# Patient Record
Sex: Female | Born: 1940 | Race: White | Hispanic: No | Smoking: Never smoker
Health system: Southern US, Community
[De-identification: ages and names within clinical notes are randomized; demographics above are authoritative.]

## PROBLEM LIST (undated history)

## (undated) DIAGNOSIS — I639 Cerebral infarction, unspecified: Secondary | ICD-10-CM

## (undated) DIAGNOSIS — H269 Unspecified cataract: Secondary | ICD-10-CM

## (undated) DIAGNOSIS — E079 Disorder of thyroid, unspecified: Secondary | ICD-10-CM

## (undated) DIAGNOSIS — I10 Essential (primary) hypertension: Secondary | ICD-10-CM

## (undated) DIAGNOSIS — D219 Benign neoplasm of connective and other soft tissue, unspecified: Secondary | ICD-10-CM

## (undated) HISTORY — DX: Benign neoplasm of connective and other soft tissue, unspecified: D21.9

## (undated) HISTORY — PX: EYE SURGERY: SHX253

## (undated) HISTORY — PX: OTHER SURGICAL HISTORY: SHX169

## (undated) HISTORY — DX: Unspecified cataract: H26.9

## (undated) HISTORY — DX: Cerebral infarction, unspecified: I63.9

## (undated) HISTORY — DX: Essential (primary) hypertension: I10

## (undated) HISTORY — DX: Disorder of thyroid, unspecified: E07.9

---

## 2004-02-08 ENCOUNTER — Other Ambulatory Visit: Admission: RE | Admit: 2004-02-08 | Discharge: 2004-02-08 | Payer: Self-pay | Admitting: Obstetrics and Gynecology

## 2004-03-17 ENCOUNTER — Ambulatory Visit: Payer: Self-pay | Admitting: Internal Medicine

## 2004-03-22 ENCOUNTER — Encounter: Admission: RE | Admit: 2004-03-22 | Discharge: 2004-03-22 | Payer: Self-pay | Admitting: Internal Medicine

## 2004-04-15 ENCOUNTER — Ambulatory Visit: Payer: Self-pay | Admitting: Internal Medicine

## 2004-04-17 DIAGNOSIS — D219 Benign neoplasm of connective and other soft tissue, unspecified: Secondary | ICD-10-CM

## 2004-04-17 HISTORY — DX: Benign neoplasm of connective and other soft tissue, unspecified: D21.9

## 2004-04-19 ENCOUNTER — Ambulatory Visit: Payer: Self-pay | Admitting: Internal Medicine

## 2004-04-21 ENCOUNTER — Ambulatory Visit: Payer: Self-pay | Admitting: Internal Medicine

## 2004-05-30 ENCOUNTER — Ambulatory Visit: Payer: Self-pay | Admitting: Internal Medicine

## 2004-10-17 ENCOUNTER — Encounter: Admission: RE | Admit: 2004-10-17 | Discharge: 2005-01-15 | Payer: Self-pay | Admitting: Neurology

## 2007-09-24 ENCOUNTER — Encounter: Admission: RE | Admit: 2007-09-24 | Discharge: 2007-11-04 | Payer: Self-pay | Admitting: Obstetrics and Gynecology

## 2008-01-03 ENCOUNTER — Encounter
Admission: RE | Admit: 2008-01-03 | Discharge: 2008-01-03 | Payer: Self-pay | Admitting: Physical Medicine and Rehabilitation

## 2008-02-04 ENCOUNTER — Encounter
Admission: RE | Admit: 2008-02-04 | Discharge: 2008-03-31 | Payer: Self-pay | Admitting: Physical Medicine and Rehabilitation

## 2008-12-09 ENCOUNTER — Inpatient Hospital Stay (HOSPITAL_COMMUNITY): Admission: EM | Admit: 2008-12-09 | Discharge: 2008-12-12 | Payer: Self-pay | Admitting: Emergency Medicine

## 2008-12-10 ENCOUNTER — Encounter (INDEPENDENT_AMBULATORY_CARE_PROVIDER_SITE_OTHER): Payer: Self-pay | Admitting: Internal Medicine

## 2008-12-11 ENCOUNTER — Encounter: Payer: Self-pay | Admitting: Neurology

## 2010-03-04 ENCOUNTER — Ambulatory Visit (HOSPITAL_COMMUNITY)
Admission: RE | Admit: 2010-03-04 | Discharge: 2010-03-04 | Payer: Self-pay | Source: Home / Self Care | Admitting: Interventional Radiology

## 2010-06-28 LAB — BUN: BUN: 25 mg/dL — ABNORMAL HIGH (ref 6–23)

## 2010-06-28 LAB — CREATININE, SERUM
Creatinine, Ser: 0.79 mg/dL (ref 0.4–1.2)
GFR calc Af Amer: >60 mL/min (ref >60)
GFR calc non Af Amer: >60 mL/min (ref >60)

## 2010-07-23 LAB — CARDIAC PANEL(CRET KIN+CKTOT+MB+TROPI)
CK, MB: 1.2 ng/mL (ref 0.3–4.0)
CK, MB: 1.5 ng/mL (ref 0.3–4.0)
Relative Index: INVALID (ref 0.0–2.5)
Relative Index: INVALID (ref 0.0–2.5)
Total CK: 33 U/L (ref 7–177)
Troponin I: 0.01 ng/mL (ref 0.00–0.06)
Troponin I: 0.01 ng/mL (ref 0.00–0.06)
Troponin I: 0.01 ng/mL (ref 0.00–0.06)

## 2010-07-23 LAB — POCT CARDIAC MARKERS
CKMB, poc: 1.3 ng/mL (ref 1.0–8.0)
Myoglobin, poc: 78.5 ng/mL (ref 12–200)
Troponin i, poc: <0.05 ng/mL (ref 0.00–0.09)

## 2010-07-23 LAB — COMPREHENSIVE METABOLIC PANEL
ALT: 20 U/L (ref 0–35)
AST: 23 U/L (ref 0–37)
Albumin: 3.2 g/dL — ABNORMAL LOW (ref 3.5–5.2)
Alkaline Phosphatase: 82 U/L (ref 39–117)
BUN: 23 mg/dL (ref 6–23)
CO2: 25 mEq/L (ref 19–32)
Calcium: 8.7 mg/dL (ref 8.4–10.5)
Chloride: 105 mEq/L (ref 96–112)
Creatinine, Ser: 0.91 mg/dL (ref 0.4–1.2)
GFR calc Af Amer: >60 mL/min (ref >60)
GFR calc non Af Amer: >60 mL/min (ref >60)
Glucose, Bld: 123 mg/dL — ABNORMAL HIGH (ref 70–99)
Potassium: 3.7 mEq/L (ref 3.5–5.1)
Sodium: 136 mEq/L (ref 135–145)
Total Bilirubin: 0.4 mg/dL (ref 0.3–1.2)
Total Protein: 6.5 g/dL (ref 6.0–8.3)

## 2010-07-23 LAB — POCT I-STAT, CHEM 8
BUN: 25 mg/dL — ABNORMAL HIGH (ref 6–23)
Calcium, Ion: 1.19 mmol/L (ref 1.12–1.32)
Chloride: 103 mEq/L (ref 96–112)
Creatinine, Ser: 0.8 mg/dL (ref 0.4–1.2)
Glucose, Bld: 87 mg/dL (ref 70–99)
HCT: 43 % (ref 36.0–46.0)
Hemoglobin: 14.6 g/dL (ref 12.0–15.0)
Potassium: 3.9 mEq/L (ref 3.5–5.1)
Sodium: 138 mEq/L (ref 135–145)
TCO2: 29 mmol/L (ref 0–100)

## 2010-07-23 LAB — CBC
HCT: 41 % (ref 36.0–46.0)
Hemoglobin: 13.7 g/dL (ref 12.0–15.0)
MCHC: 33.6 g/dL (ref 30.0–36.0)
MCV: 88.1 fL (ref 78.0–100.0)
Platelets: 284 10*3/uL (ref 150–400)
RBC: 4.65 MIL/uL (ref 3.87–5.11)
RDW: 14 % (ref 11.5–15.5)
WBC: 7.3 10*3/uL (ref 4.0–10.5)

## 2010-07-23 LAB — DIFFERENTIAL
Basophils Absolute: 0 10*3/uL (ref 0.0–0.1)
Basophils Relative: 0 % (ref 0–1)
Eosinophils Absolute: 0.1 10*3/uL (ref 0.0–0.7)
Eosinophils Relative: 1 % (ref 0–5)
Lymphocytes Relative: 18 % (ref 12–46)
Lymphs Abs: 1.3 10*3/uL (ref 0.7–4.0)
Monocytes Absolute: 0.8 10*3/uL (ref 0.1–1.0)
Monocytes Relative: 11 % (ref 3–12)
Neutro Abs: 5.1 10*3/uL (ref 1.7–7.7)
Neutrophils Relative %: 70 % (ref 43–77)

## 2010-07-23 LAB — MAGNESIUM: Magnesium: 2.2 mg/dL (ref 1.5–2.5)

## 2010-07-23 LAB — LIPID PANEL
Cholesterol: 265 mg/dL — ABNORMAL HIGH (ref 0–200)
HDL: 39 mg/dL — ABNORMAL LOW (ref >39)
LDL Cholesterol: 190 mg/dL — ABNORMAL HIGH (ref 0–99)
Total CHOL/HDL Ratio: 6.8 RATIO
Triglycerides: 179 mg/dL — ABNORMAL HIGH (ref <150)
VLDL: 36 mg/dL (ref 0–40)

## 2010-07-23 LAB — PHOSPHORUS: Phosphorus: 4.2 mg/dL (ref 2.3–4.6)

## 2010-07-23 LAB — HEMOGLOBIN A1C
Hgb A1c MFr Bld: 6.3 % — ABNORMAL HIGH (ref 4.6–6.1)
Mean Plasma Glucose: 134 mg/dL

## 2010-07-23 LAB — APTT: aPTT: 33 seconds (ref 24–37)

## 2010-07-23 LAB — D-DIMER, QUANTITATIVE: D-Dimer, Quant: 0.93 ug/mL-FEU — ABNORMAL HIGH (ref 0.00–0.48)

## 2010-07-23 LAB — PROTIME-INR
INR: 0.9 (ref 0.00–1.49)
Prothrombin Time: 12.1 seconds (ref 11.6–15.2)

## 2010-07-23 LAB — TSH: TSH: 0.029 u[IU]/mL — ABNORMAL LOW (ref 0.350–4.500)

## 2010-08-30 NOTE — Consult Note (Signed)
NAMETELICIA, HODGKISS              ACCOUNT NO.:  [id number]   MEDICAL RECORD NO.:  [id number]          PATIENT TYPE:  INP   LOCATION:  0104                         FACILITY:  [facility]   PHYSICIAN:  Pramod P. Pearlean Brownie, MD    DATE OF BIRTH:  07-06-1940   DATE OF CONSULTATION:  12/09/2008  DATE OF DISCHARGE:                                 CONSULTATION   REFERRING PHYSICIAN:  [facility].   REASON FOR REFERRAL:  TIA.   HISTORY OF PRESENT ILLNESS:  Ms. Mand is a 70 year old Caucasian lady  who has been having intermittent symptoms of left leg incoordination and  weakness since 2 days ago.  The initial episode occurred in the middle  of the night on Monday night and when she got up to go the restroom she  noticed her left leg and she had trouble controlling it.  She went back  to sleep and when she woke up in the morning she was fine.  Yesterday  afternoon again she had another episode with the left leg control was a  problem.  She could walk but had trouble moving her left leg.  Today  again she had an episode which lasted an hour in which she had trouble  controlling her left leg.  She also noted she had trouble touching her  nose with her left hand but had no trouble with her right hand.  She  denied any headache, slurred speech, numbness, vertigo.  She has no  known prior history of TIA, stroke, seizures.  She was seen by Dr. Dennie Fetters  in our office a few years ago for right leg radicular pain and had an  MRI scan at that time bit she was not found to be a surgical candidate.  In the last couple of months  she has been complaining of some  intermittent paresthesias in the right hand but has not yet had an  evaluation done for that.   PAST MEDICAL HISTORY:  Significant for:  1. Arthritis.  2. Hyperlipidemia.  3. Scoliosis.  4. Hypothyroidism.   PAST SURGICAL HISTORY:  1. Thyroid surgery.  2. Facial growth removal in 1951.  3. 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  4. Partial thyroid  cyst removal 1960.   FAMILY HISTORY:  Positive for aneurysms in several uncles and dementia  in mother.  Mother died of a brain tumor.   HOME MEDICATIONS:  Amitriptyline and Armour Thyroid.   SOCIAL HISTORY:  She lives alone, does not smoke or drink.   ALLERGIES TO MEDICATIONS:  Aciphex, Aleve, codeine, erythromycin, Keflex  and Novocain.   REVIEW OF SYSTEMS:  Positive for numbness, gait difficulties, tingling.  No chest pain, fever, cough, shortness of breath or diarrhea.   PHYSICAL EXAM:  Reveals a pleasant middle-aged Caucasian lady currently  not in distress.  Temperature 97.6, blood pressure 179/92, pulse rate 84  per minute, regular,  respiratory rate 18 per minute, blood pressure  152/83.  Distal pulses are well felt.  HEAD:  Nontraumatic.  NECK:  Supple.  No bruit.  Surgical scar from thyroid surgery is noted  in the  neck.  CARDIAC EXAM:  Regular heart sounds.  No murmur or gallop.  LUNGS:  Clear to auscultation.  ABDOMEN:  Soft, nontender.  NEUROLOGICAL EXAM:  She is pleasant, awake, alert, cooperative.  There  is no aphasia, apraxia or dysarthria.  Pupils equal, reactive.  Eye  movements are full range.  Visual acuity and fields adequate.  Face is  symmetric.  Tongue is midline.  Motor system exam reveals no upper  extremity drift, symmetric upper and lower extremity strength, tone,  reflexes, coordination, sensation.  Gait was not tested.   DATA REVIEWED:  CT scan of the head shows no acute abnormality.  A  calcified dilated ectatic left vertebral artery is noted which is  causing some mass effect on the brain stem.  MRI scan of the brain is  not available but MRA of the brain is available which shows ectatic left  vertebral artery which is patent without significant flow restriction.  There is no large vessel stenosis noted.  Admission labs are pending at  this time.   IMPRESSION:  A 70-year -old lady with recurrent episodes of left leg  clumsiness with some left  hand clumsiness likely brainstem transient  ischemic attacks, etiology to be determined.  She does have a calcified  ectatic left vertebral artery which is causing some mass effect on the  brain stem, but I am not sure this is the etiology of her transient  ischemic attacks.  1. The patient is being admitted for further evaluation.  Recommend      completing MRI scan of the brain and carotid Doppler studies, two-      dimensional echocardiogram, fasting lipid profile, hemoglobin A1c.      __________ aspirin for now.  2. Treatment for her lipids with statins.  3. Physical, occupational therapy consults.  We will be happy to      follow the patient in consult.  Kindly call for questions.           ______________________________  Sunny Schlein. Pearlean Brownie, MD     PPS/MEDQ  D:  12/09/2008  T:  12/09/2008  Job:  [id number]

## 2010-08-30 NOTE — Discharge Summary (Signed)
Patricia Beck, ROE NO.:  [id number]   MEDICAL RECORD NO.:  [id number]          PATIENT TYPE:  INP   LOCATION:  1421                         FACILITY:  [facility]   PHYSICIAN:  Patricia Scott, MD     DATE OF BIRTH:  [birth year]   DATE OF ADMISSION:  12/09/2008  DATE OF DISCHARGE:  12/12/2008                               DISCHARGE SUMMARY   PRIMARY MEDICAL DOCTOR:  Dr. Monico Beck.   DISCHARGE DIAGNOSES:  1. Transient left lower extremity weakness and hemiataxia, resolved.      Unclear etiology.  2. Hypertension.  3. Dyslipidemia.  4. Hypothyroidism.   DISCHARGE MEDICATIONS:  1. Amitriptyline 25 mg p.o. q.h.s.  2. Armour Thyroid 90 mg p.o. daily.  3. Enteric-coated aspirin 325 mg p.o. daily.  4. Metoprolol 25 mg tablet, half-tablet p.o. b.i.d.  5. Pravastatin 10 mg p.o. q.h.s.   PROCEDURES:  1. A 4-vessel angiogram by Dr. Corliss Beck on December 11, 2008.      Impression:      a.     Mild, diffuse fusiform prominence of left vertebrobasilar       junction.      b.     Moderate midbasilar stenosis.      c.     Probable arteriosclerotic changes involving right posterior       cerebral artery, left posterior cerebral artery and right middle       cerebral artery.  2. MRI of the head without contrast.  Impression:      a.     No evidence of acute ischemia.      b.     Nonspecific subcortical white matter changes       supratentorially and in the left paramedian pons.  These probably       represent areas of ischemic gliosis related to small-vessel       disease due to hypertension and/or diabetes.  Less likely       possibilities are demyelinating process or vasculitis.      c.     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      d.     Focal areas of hemosiderin deposition, possibly related to       previous ischemia.   Appearance is atypical for cavernoma.      e.     Mild inflammatory thickening of the mucosa in the ethmoids       and the maxillary sinus.      f.     Consideration of a formal catheter angiogram was suggested       by the reading radiologist.  3. MRA of the head without contrast.  Impression:      a.     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      b.     No intracranial aneurysm or major circle of Willis branch       occlusion.  4. CT of the head without contrast.  Impression:  No intracranial      hemorrhage or mass effect.  No definite acute infarction.  There is      an ectatic, tortuous, heavily-calcified left vertebral artery with      mass effect on the brainstem.  Further evaluation with CTA was      suggested.  5. 2-D echocardiogram.  Conclusions:  Left ventricular cavity size was      normal.  Wall thickness was normal.  Systolic function was normal.      Estimated ejection fraction was in the range of 60-65%.  Wall      motion was normal.  There were no regional wall motion      abnormalities.  Doppler parameters were consistent with abnormal      left ventricular relaxation or grade 1 diastolic dysfunction.      Impression:  No cardiac source of emboli was identified.  6. Carotid Dopplers.  Right 60-79% internal carotid artery stenosis.      May not be this high.  Plaque morphology does not suggest this      range of stenosis.  Left:  No ICA stenosis.  Bilateral vertebral      artery antegrade flow.   LABORATORY DATA:  Coagulation indices within normal limits.  Cardiac  panel cycled and negative.  TSH 0.029.  Lipid panel with cholesterol  265, triglycerides 179, HDL 39, LDL 190, VLDL 36.  Hemoglobin A1c 6.3.  Phosphorus 4.2.  Magnesium 2.2.  Comprehensive metabolic panel   unremarkable except for albumin 3.2.  BUN 23, creatinine 0.91.  D-dimer  0.93.  CBC within normal limits, hemoglobin 13.7, hematocrit 41, white  blood cells 7.3, platelets 284.   CONSULTATIONS:  1. Neurology:  Dr. Pearlean Beck, Dr. Thad Beck, Dr. Sharene Beck.  2. Interventional Radiology:  Dr. Corliss Beck.   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  These, she seemed to indicate, were worse  with exertion.  She also had lesser symptoms in the upper extremity.  She presented to the emergency room.  She was initially assessed to have  a transient ischemic attack and was admitted.   1. Transient left lower extremity weakness and hemiataxia.  Extensive      evaluation was done.  Her MRI suggested a fusiform aneurysmal      dilatation of vertebrobasilar artery with mass effect on the pons.      Neurology wanted to make sure that this was not the cause of her      symptoms.  A conventional 4-vessel angiogram was carried out with      findings as above.  Dr. Sharene Beck reevaluated the patient yesterday      and I had an opportunity to discuss with him in detail.  He      indicated that there was nothing to explain the left leg weakness      the patient had.  He cleared her for discharging the patient home      today on statins, beta-blockers and aspirin full-dose or  Plavix 75      mg.  He recommended follow-up with Dr. Pearlean Beck in 2 months and if the      patient had repeated symptoms, then she may need referral to a      tertiary care for further evaluation and management.  The patient      has not had any further episodes of these symptoms all day      yesterday or today.  She has ambulated in the room.  She is keen to      return  home.  No stroke was confirmed, and Neurology also thought      that a TIA was less likely.  2. Hypertension.  The patient indicates that she was a hypertensive      and on medications but she could not remember her medications.      Intermittently her blood pressures were elevated in the hospital to      the 160s over 90s range.  With this she had associated sinus      tachycardia on monitor, although most of these episodes were with      activity and asymptomatic.  She was started on low-dose beta      blockers with improved blood pressure control of 115/64.  She does      not demonstrate any features of hyperthyroidism.  3. Dyslipidemia.  The patient has been started statin.  Monitor      fasting lipids as an outpatient.  4. Hypothyroidism.  The patient indicates that she gets her Armour      Thyroid from [facility] as a 90 mg per day capsule.  She recently      purchase a 10-month supply.  Given her transient sinus tachycardia      and low TSH, there was some concern about oversupplementation;      however, I believe she is clinically euthyroid and not fully      convinced that the Armour Thyroid dose is high.  At this time we      will continue the current dose, and I have asked the patient to      follow up with her primary MD for dose adjustments.     Time taken in coordinating this discharge is 35 minutes.      Patricia Scott, MD  Electronically Signed     AH/MEDQ  D:  12/12/2008  T:  12/12/2008  Job:  [id number]   cc:   Pramod P. Patricia Brownie, MD  Fax: [phone]   Chip Corine Shelter, MD   Grandville Silos. Patricia Beck, M.D.  Fax: [phone]

## 2010-08-30 NOTE — H&P (Signed)
Patricia Beck, Patricia Beck NO.:  [id number]   MEDICAL RECORD NO.:  [id number]          PATIENT TYPE:  EMS   LOCATION:  ED                           FACILITY:  [facility]   PHYSICIAN:  Peggye Pitt, M.D. DATE OF BIRTH:  1940/05/14   DATE OF ADMISSION:  12/09/2008  DATE OF DISCHARGE:                              HISTORY & PHYSICAL   PRIMARY CARE PHYSICIAN:  Dr. Monico Hoar here in [place].   CHIEF COMPLAINT:  Left arm and lower extremity weakness.   Ms. Kammerer is a pleasant 70 year old Caucasian lady with a known history  of hypothyroidism and insomnia and diet controlled hyperlipidemia who  presents to the hospital with left arm weakness.  It appears that her  neurological symptoms date about 3 weeks past when she was in [place]  and she woke up one day feeling extremely dizzy.  This resolved within  24 hours.  Subsequently over the past 3 days she has been noticing that  her left lower extremity is weak to the point where she has had some  difficulty walking and felt as if she were stumbling.  Symptoms  worsened today when she started having left arm numbness and weakness  and because of this she decided to come to the hospital to seek further  evaluation and management.  Upon further questioning she has not had any  fevers, chills, headache, blurry vision, ataxia, diarrhea, abdominal  pain or any other symptoms.   ALLERGIES:  SHE HAS STATED ALLERGIES TO ALEVE, CODEINE, NOVOCAINE,  KEFLEX AND ERYTHROMYCIN AND POOR TOLERANCE TO CELEBREX, VIOXX,  NEURONTIN, LIPITOR AND FLEXERIL.  SHE HAS ALSO HAD INTOLERANCE TO  NSAIDS.   PAST MEDICAL HISTORY:  Significant for hypothyroidism and insomnia.   HOME MEDICATIONS:  1. Armour Thyroid 90 mg daily.  2. Amitriptyline 25 mg at bedtime.  3. She also takes a large list of over-the-counter supplements and      medications including vitamin C, E, calcium, vitamin D, melatonin,      ibuprofen, over-the-counter folate,  selenium, EPA DHA complex and      omega three acids.   SOCIAL HISTORY:  She lives on her own.  She is currently retired.  She  denies any alcohol, tobacco or illicit drug use.   FAMILY HISTORY:  Significant for coronary artery disease and stroke in  multiple family members, as well as diabetes in her father,  thyroid  disease in the mother as well as brain aneurysm in several uncles.  Her  father actually passed with a brain tumor in 2003.   REVIEW OF SYSTEMS:  Negative except as already mentioned in HPI.   PHYSICAL EXAMINATION:  VITAL SIGNS:  Upon admission blood pressure  172/82 decreasing to 159/86 without intervention, heart rate 84,  respirations 18, O2 saturations 98% on room air with a temperature of  97.6.  GENERAL:  She is alert, awake, oriented x3, does not appear to be in any  acute distress.  HEENT: Normocephalic, atraumatic.  Her pupils are equally reactive to  light and accommodation with intact extraocular movements.  NECK:  Supple.  No  JVD, no lymphadenopathy, no bruits, no goiter.  HEART:  Regular rate and rhythm with no murmurs, rubs or gallops.  ABDOMEN:  Soft, nontender, nondistended with positive bowel sounds.  EXTREMITIES:  No edema, positive pedal pulses.  NEUROLOGIC:  Her mental status is intact.  Cranial nerves II through XII  grossly intact.  Her sensation is intact to light touch.  Her muscle  strength is 5/5 bilateral and symmetric.  Her proprioception is intact.  Her DTRs are 2+ symmetric.  Her finger-to-nose and heel-to-shin are  normal on both sides.  I have not ambulated her.  Her Babinski is  downgoing bilaterally.   LABS UPON ADMISSION:  Sodium 138, potassium 3.9, chloride 103, bicarb  29, BUN 25, creatinine 0.8 with a glucose of 87.  WBC 7.3, hemoglobin  13.7 and a platelet count of 284.  A D-dimer is elevated 0.93.  First  set of point of care markers is negative.  EKG shows sinus tachycardia  with a normal axis, a rate of 118, some left atrial  enlargement and a  right bundle branch block.  We do not have an old EKG to compare to.  A  CT scan of the head without contrast shows no intracranial hemorrhage or  mass effect.  No definite acute infarction.  There is an ectatic  tortuous heavily calcified left vertebral  artery with mass effect on  the brain stem.  An MRA of the brain is also ordered that shows moderate  to severe atherosclerosis involving the cavernous ICAs, distal bilateral  PCA and left vertebral/  basilar artery with moderate mid basilar artery  stenosis.  No intracranial aneurysm or major circle of Willis branch  occlusion.   ASSESSMENT AND PLAN:  1. Her left upper and lower extremity weakness, questionable transient      ischemic attack.  She has passed a swallow evaluation so we will      feed her. I will start her on aspirin and we will check a fasting      lipid profile.  Will check a 2-D echo and carotid Dopplers as part      of a stroke workup.  Will also have PT and occupational therapy      evaluate her.  I will also proceed with consulting Neurology given      her CT and MRA  findings for further recommendations.  2. For hypothyroidism we will check a TSH and continue her home dose      of Armour Thyroid.  3. For her elevated blood pressure without diagnosis of hypertension,      at this point we will follow.  However given her main diagnosis of      TIA, if her blood pressure continues to be elevated then certainly      further blood pressure control his mandated.  4  For prophylaxis while in the hospital she will be on Protonix for GI  prophylaxis and on Lovenox for DVT prophylaxis.      Peggye Pitt, M.D.  Electronically Signed     EH/MEDQ  D:  12/09/2008  T:  12/09/2008  Job:  [id number]   cc:   Chip Dr. Rogelia Mire

## 2011-05-03 DIAGNOSIS — G4700 Insomnia, unspecified: Secondary | ICD-10-CM | POA: Diagnosis not present

## 2011-06-02 DIAGNOSIS — L821 Other seborrheic keratosis: Secondary | ICD-10-CM | POA: Diagnosis not present

## 2011-06-02 DIAGNOSIS — D1801 Hemangioma of skin and subcutaneous tissue: Secondary | ICD-10-CM | POA: Diagnosis not present

## 2011-06-02 DIAGNOSIS — L659 Nonscarring hair loss, unspecified: Secondary | ICD-10-CM | POA: Diagnosis not present

## 2011-06-02 DIAGNOSIS — L820 Inflamed seborrheic keratosis: Secondary | ICD-10-CM | POA: Diagnosis not present

## 2011-07-11 DIAGNOSIS — E559 Vitamin D deficiency, unspecified: Secondary | ICD-10-CM | POA: Diagnosis not present

## 2011-07-11 DIAGNOSIS — G4700 Insomnia, unspecified: Secondary | ICD-10-CM | POA: Diagnosis not present

## 2011-07-11 DIAGNOSIS — R7989 Other specified abnormal findings of blood chemistry: Secondary | ICD-10-CM | POA: Diagnosis not present

## 2011-07-11 DIAGNOSIS — E039 Hypothyroidism, unspecified: Secondary | ICD-10-CM | POA: Diagnosis not present

## 2011-07-26 DIAGNOSIS — G4700 Insomnia, unspecified: Secondary | ICD-10-CM | POA: Diagnosis not present

## 2011-10-11 DIAGNOSIS — H2510 Age-related nuclear cataract, unspecified eye: Secondary | ICD-10-CM | POA: Diagnosis not present

## 2011-10-16 DIAGNOSIS — E039 Hypothyroidism, unspecified: Secondary | ICD-10-CM | POA: Diagnosis not present

## 2011-10-16 DIAGNOSIS — E559 Vitamin D deficiency, unspecified: Secondary | ICD-10-CM | POA: Diagnosis not present

## 2011-10-16 DIAGNOSIS — G4700 Insomnia, unspecified: Secondary | ICD-10-CM | POA: Diagnosis not present

## 2011-10-16 DIAGNOSIS — R7989 Other specified abnormal findings of blood chemistry: Secondary | ICD-10-CM | POA: Diagnosis not present

## 2011-10-16 DIAGNOSIS — E782 Mixed hyperlipidemia: Secondary | ICD-10-CM | POA: Diagnosis not present

## 2011-11-01 DIAGNOSIS — G4700 Insomnia, unspecified: Secondary | ICD-10-CM | POA: Diagnosis not present

## 2011-11-02 DIAGNOSIS — G4700 Insomnia, unspecified: Secondary | ICD-10-CM | POA: Diagnosis not present

## 2011-11-15 DIAGNOSIS — H2510 Age-related nuclear cataract, unspecified eye: Secondary | ICD-10-CM | POA: Diagnosis not present

## 2011-11-21 DIAGNOSIS — Z79899 Other long term (current) drug therapy: Secondary | ICD-10-CM | POA: Diagnosis not present

## 2011-11-21 DIAGNOSIS — Z883 Allergy status to other anti-infective agents status: Secondary | ICD-10-CM | POA: Diagnosis not present

## 2011-11-21 DIAGNOSIS — H2510 Age-related nuclear cataract, unspecified eye: Secondary | ICD-10-CM | POA: Diagnosis not present

## 2011-11-21 DIAGNOSIS — E079 Disorder of thyroid, unspecified: Secondary | ICD-10-CM | POA: Diagnosis not present

## 2011-11-21 DIAGNOSIS — Z7982 Long term (current) use of aspirin: Secondary | ICD-10-CM | POA: Diagnosis not present

## 2011-11-21 DIAGNOSIS — M129 Arthropathy, unspecified: Secondary | ICD-10-CM | POA: Diagnosis not present

## 2011-11-21 DIAGNOSIS — H269 Unspecified cataract: Secondary | ICD-10-CM | POA: Diagnosis not present

## 2011-11-21 DIAGNOSIS — Z888 Allergy status to other drugs, medicaments and biological substances status: Secondary | ICD-10-CM | POA: Diagnosis not present

## 2011-11-21 DIAGNOSIS — Z886 Allergy status to analgesic agent status: Secondary | ICD-10-CM | POA: Diagnosis not present

## 2011-11-21 DIAGNOSIS — I10 Essential (primary) hypertension: Secondary | ICD-10-CM | POA: Diagnosis not present

## 2011-11-21 DIAGNOSIS — M4120 Other idiopathic scoliosis, site unspecified: Secondary | ICD-10-CM | POA: Diagnosis not present

## 2011-11-29 DIAGNOSIS — G4700 Insomnia, unspecified: Secondary | ICD-10-CM | POA: Diagnosis not present

## 2011-12-22 DIAGNOSIS — H2510 Age-related nuclear cataract, unspecified eye: Secondary | ICD-10-CM | POA: Diagnosis not present

## 2012-01-30 DIAGNOSIS — R030 Elevated blood-pressure reading, without diagnosis of hypertension: Secondary | ICD-10-CM | POA: Diagnosis not present

## 2012-01-30 DIAGNOSIS — Z8739 Personal history of other diseases of the musculoskeletal system and connective tissue: Secondary | ICD-10-CM | POA: Diagnosis not present

## 2012-01-30 DIAGNOSIS — M4120 Other idiopathic scoliosis, site unspecified: Secondary | ICD-10-CM | POA: Diagnosis not present

## 2012-01-30 DIAGNOSIS — H2510 Age-related nuclear cataract, unspecified eye: Secondary | ICD-10-CM | POA: Diagnosis not present

## 2012-01-30 DIAGNOSIS — H269 Unspecified cataract: Secondary | ICD-10-CM | POA: Diagnosis not present

## 2012-01-30 DIAGNOSIS — E079 Disorder of thyroid, unspecified: Secondary | ICD-10-CM | POA: Diagnosis not present

## 2012-03-06 DIAGNOSIS — G4700 Insomnia, unspecified: Secondary | ICD-10-CM | POA: Diagnosis not present

## 2012-03-22 DIAGNOSIS — G4700 Insomnia, unspecified: Secondary | ICD-10-CM | POA: Diagnosis not present

## 2012-05-03 DIAGNOSIS — G4700 Insomnia, unspecified: Secondary | ICD-10-CM | POA: Diagnosis not present

## 2012-05-07 DIAGNOSIS — E039 Hypothyroidism, unspecified: Secondary | ICD-10-CM | POA: Diagnosis not present

## 2012-05-07 DIAGNOSIS — E782 Mixed hyperlipidemia: Secondary | ICD-10-CM | POA: Diagnosis not present

## 2012-05-07 DIAGNOSIS — E559 Vitamin D deficiency, unspecified: Secondary | ICD-10-CM | POA: Diagnosis not present

## 2012-05-07 DIAGNOSIS — R7989 Other specified abnormal findings of blood chemistry: Secondary | ICD-10-CM | POA: Diagnosis not present

## 2012-05-07 DIAGNOSIS — G4700 Insomnia, unspecified: Secondary | ICD-10-CM | POA: Diagnosis not present

## 2012-05-22 DIAGNOSIS — G4700 Insomnia, unspecified: Secondary | ICD-10-CM | POA: Diagnosis not present

## 2012-07-24 DIAGNOSIS — N951 Menopausal and female climacteric states: Secondary | ICD-10-CM | POA: Diagnosis not present

## 2012-08-05 DIAGNOSIS — N951 Menopausal and female climacteric states: Secondary | ICD-10-CM | POA: Diagnosis not present

## 2012-08-14 DIAGNOSIS — R5383 Other fatigue: Secondary | ICD-10-CM | POA: Diagnosis not present

## 2012-08-14 DIAGNOSIS — R5381 Other malaise: Secondary | ICD-10-CM | POA: Diagnosis not present

## 2012-08-27 DIAGNOSIS — J069 Acute upper respiratory infection, unspecified: Secondary | ICD-10-CM | POA: Diagnosis not present

## 2012-09-20 DIAGNOSIS — L819 Disorder of pigmentation, unspecified: Secondary | ICD-10-CM | POA: Diagnosis not present

## 2012-09-20 DIAGNOSIS — D239 Other benign neoplasm of skin, unspecified: Secondary | ICD-10-CM | POA: Diagnosis not present

## 2012-09-20 DIAGNOSIS — L820 Inflamed seborrheic keratosis: Secondary | ICD-10-CM | POA: Diagnosis not present

## 2012-09-20 DIAGNOSIS — D2360 Other benign neoplasm of skin of unspecified upper limb, including shoulder: Secondary | ICD-10-CM | POA: Diagnosis not present

## 2012-09-20 DIAGNOSIS — D1801 Hemangioma of skin and subcutaneous tissue: Secondary | ICD-10-CM | POA: Diagnosis not present

## 2012-10-04 DIAGNOSIS — L84 Corns and callosities: Secondary | ICD-10-CM | POA: Diagnosis not present

## 2012-10-25 DIAGNOSIS — H524 Presbyopia: Secondary | ICD-10-CM | POA: Diagnosis not present

## 2012-10-25 DIAGNOSIS — H2640 Unspecified secondary cataract: Secondary | ICD-10-CM | POA: Diagnosis not present

## 2012-10-25 DIAGNOSIS — Z961 Presence of intraocular lens: Secondary | ICD-10-CM | POA: Diagnosis not present

## 2012-10-29 DIAGNOSIS — G4700 Insomnia, unspecified: Secondary | ICD-10-CM | POA: Diagnosis not present

## 2012-11-02 DIAGNOSIS — E279 Disorder of adrenal gland, unspecified: Secondary | ICD-10-CM | POA: Diagnosis not present

## 2012-12-17 DIAGNOSIS — E782 Mixed hyperlipidemia: Secondary | ICD-10-CM | POA: Diagnosis not present

## 2012-12-17 DIAGNOSIS — E039 Hypothyroidism, unspecified: Secondary | ICD-10-CM | POA: Diagnosis not present

## 2012-12-17 DIAGNOSIS — D509 Iron deficiency anemia, unspecified: Secondary | ICD-10-CM | POA: Diagnosis not present

## 2012-12-17 DIAGNOSIS — E559 Vitamin D deficiency, unspecified: Secondary | ICD-10-CM | POA: Diagnosis not present

## 2012-12-17 DIAGNOSIS — R7989 Other specified abnormal findings of blood chemistry: Secondary | ICD-10-CM | POA: Diagnosis not present

## 2012-12-30 DIAGNOSIS — L509 Urticaria, unspecified: Secondary | ICD-10-CM | POA: Diagnosis not present

## 2013-01-07 DIAGNOSIS — N951 Menopausal and female climacteric states: Secondary | ICD-10-CM | POA: Diagnosis not present

## 2013-01-20 DIAGNOSIS — G4700 Insomnia, unspecified: Secondary | ICD-10-CM | POA: Diagnosis not present

## 2013-02-07 DIAGNOSIS — L299 Pruritus, unspecified: Secondary | ICD-10-CM | POA: Diagnosis not present

## 2013-02-07 DIAGNOSIS — R21 Rash and other nonspecific skin eruption: Secondary | ICD-10-CM | POA: Diagnosis not present

## 2013-02-11 ENCOUNTER — Other Ambulatory Visit: Payer: Self-pay | Admitting: Dermatology

## 2013-02-11 ENCOUNTER — Ambulatory Visit
Admission: RE | Admit: 2013-02-11 | Discharge: 2013-02-11 | Disposition: A | Discharge status: Home / Self Care | Payer: Medicare Other | Source: Ambulatory Visit | Attending: Dermatology | Admitting: Dermatology

## 2013-02-11 DIAGNOSIS — R9389 Abnormal findings on diagnostic imaging of other specified body structures: Secondary | ICD-10-CM | POA: Diagnosis not present

## 2013-02-11 DIAGNOSIS — L752 Apocrine miliaria: Secondary | ICD-10-CM

## 2013-03-03 DIAGNOSIS — Z1231 Encounter for screening mammogram for malignant neoplasm of breast: Secondary | ICD-10-CM | POA: Diagnosis not present

## 2013-03-03 DIAGNOSIS — Z124 Encounter for screening for malignant neoplasm of cervix: Secondary | ICD-10-CM | POA: Diagnosis not present

## 2013-03-04 DIAGNOSIS — L299 Pruritus, unspecified: Secondary | ICD-10-CM | POA: Diagnosis not present

## 2013-03-07 DIAGNOSIS — M438X9 Other specified deforming dorsopathies, site unspecified: Secondary | ICD-10-CM | POA: Diagnosis not present

## 2013-03-07 DIAGNOSIS — Z1231 Encounter for screening mammogram for malignant neoplasm of breast: Secondary | ICD-10-CM | POA: Diagnosis not present

## 2013-03-07 DIAGNOSIS — M899 Disorder of bone, unspecified: Secondary | ICD-10-CM | POA: Diagnosis not present

## 2013-03-07 DIAGNOSIS — Z124 Encounter for screening for malignant neoplasm of cervix: Secondary | ICD-10-CM | POA: Diagnosis not present

## 2013-03-17 DIAGNOSIS — L290 Pruritus ani: Secondary | ICD-10-CM | POA: Diagnosis not present

## 2013-05-09 DIAGNOSIS — L299 Pruritus, unspecified: Secondary | ICD-10-CM | POA: Diagnosis not present

## 2013-05-13 DIAGNOSIS — N951 Menopausal and female climacteric states: Secondary | ICD-10-CM | POA: Diagnosis not present

## 2013-05-16 DIAGNOSIS — N951 Menopausal and female climacteric states: Secondary | ICD-10-CM | POA: Diagnosis not present

## 2013-05-21 DIAGNOSIS — N951 Menopausal and female climacteric states: Secondary | ICD-10-CM | POA: Diagnosis not present

## 2013-05-26 DIAGNOSIS — E782 Mixed hyperlipidemia: Secondary | ICD-10-CM | POA: Diagnosis not present

## 2013-05-26 DIAGNOSIS — D509 Iron deficiency anemia, unspecified: Secondary | ICD-10-CM | POA: Diagnosis not present

## 2013-05-26 DIAGNOSIS — E039 Hypothyroidism, unspecified: Secondary | ICD-10-CM | POA: Diagnosis not present

## 2013-05-26 DIAGNOSIS — R7989 Other specified abnormal findings of blood chemistry: Secondary | ICD-10-CM | POA: Diagnosis not present

## 2013-05-26 DIAGNOSIS — E559 Vitamin D deficiency, unspecified: Secondary | ICD-10-CM | POA: Diagnosis not present

## 2013-05-26 DIAGNOSIS — N951 Menopausal and female climacteric states: Secondary | ICD-10-CM | POA: Diagnosis not present

## 2013-06-07 DIAGNOSIS — J019 Acute sinusitis, unspecified: Secondary | ICD-10-CM | POA: Diagnosis not present

## 2013-06-07 DIAGNOSIS — R0982 Postnasal drip: Secondary | ICD-10-CM | POA: Diagnosis not present

## 2013-06-23 DIAGNOSIS — G4700 Insomnia, unspecified: Secondary | ICD-10-CM | POA: Diagnosis not present

## 2013-06-25 DIAGNOSIS — H2640 Unspecified secondary cataract: Secondary | ICD-10-CM | POA: Diagnosis not present

## 2013-06-25 DIAGNOSIS — Z961 Presence of intraocular lens: Secondary | ICD-10-CM | POA: Diagnosis not present

## 2013-06-25 DIAGNOSIS — H43819 Vitreous degeneration, unspecified eye: Secondary | ICD-10-CM | POA: Diagnosis not present

## 2013-06-25 DIAGNOSIS — H524 Presbyopia: Secondary | ICD-10-CM | POA: Diagnosis not present

## 2013-06-27 DIAGNOSIS — J019 Acute sinusitis, unspecified: Secondary | ICD-10-CM | POA: Diagnosis not present

## 2013-06-27 DIAGNOSIS — R0982 Postnasal drip: Secondary | ICD-10-CM | POA: Diagnosis not present

## 2013-09-26 DIAGNOSIS — L821 Other seborrheic keratosis: Secondary | ICD-10-CM | POA: Diagnosis not present

## 2013-09-26 DIAGNOSIS — L819 Disorder of pigmentation, unspecified: Secondary | ICD-10-CM | POA: Diagnosis not present

## 2013-09-26 DIAGNOSIS — R209 Unspecified disturbances of skin sensation: Secondary | ICD-10-CM | POA: Diagnosis not present

## 2013-09-26 DIAGNOSIS — L219 Seborrheic dermatitis, unspecified: Secondary | ICD-10-CM | POA: Diagnosis not present

## 2013-09-26 DIAGNOSIS — L299 Pruritus, unspecified: Secondary | ICD-10-CM | POA: Diagnosis not present

## 2013-09-26 DIAGNOSIS — D1801 Hemangioma of skin and subcutaneous tissue: Secondary | ICD-10-CM | POA: Diagnosis not present

## 2013-09-26 DIAGNOSIS — D239 Other benign neoplasm of skin, unspecified: Secondary | ICD-10-CM | POA: Diagnosis not present

## 2013-10-16 DIAGNOSIS — F411 Generalized anxiety disorder: Secondary | ICD-10-CM | POA: Diagnosis not present

## 2013-10-20 DIAGNOSIS — B9789 Other viral agents as the cause of diseases classified elsewhere: Secondary | ICD-10-CM | POA: Diagnosis not present

## 2013-10-20 DIAGNOSIS — R070 Pain in throat: Secondary | ICD-10-CM | POA: Diagnosis not present

## 2013-11-17 DIAGNOSIS — R7989 Other specified abnormal findings of blood chemistry: Secondary | ICD-10-CM | POA: Diagnosis not present

## 2013-11-17 DIAGNOSIS — E559 Vitamin D deficiency, unspecified: Secondary | ICD-10-CM | POA: Diagnosis not present

## 2013-11-17 DIAGNOSIS — E782 Mixed hyperlipidemia: Secondary | ICD-10-CM | POA: Diagnosis not present

## 2013-11-17 DIAGNOSIS — D509 Iron deficiency anemia, unspecified: Secondary | ICD-10-CM | POA: Diagnosis not present

## 2013-11-17 DIAGNOSIS — E7800 Pure hypercholesterolemia, unspecified: Secondary | ICD-10-CM | POA: Diagnosis not present

## 2013-11-26 DIAGNOSIS — H2640 Unspecified secondary cataract: Secondary | ICD-10-CM | POA: Diagnosis not present

## 2013-12-03 DIAGNOSIS — N951 Menopausal and female climacteric states: Secondary | ICD-10-CM | POA: Diagnosis not present

## 2014-01-08 DIAGNOSIS — N8110 Cystocele, unspecified: Secondary | ICD-10-CM | POA: Diagnosis not present

## 2014-01-20 DIAGNOSIS — N813 Complete uterovaginal prolapse: Secondary | ICD-10-CM | POA: Diagnosis not present

## 2014-01-20 DIAGNOSIS — B373 Candidiasis of vulva and vagina: Secondary | ICD-10-CM | POA: Diagnosis not present

## 2014-02-04 DIAGNOSIS — N812 Incomplete uterovaginal prolapse: Secondary | ICD-10-CM | POA: Diagnosis not present

## 2014-02-10 DIAGNOSIS — N812 Incomplete uterovaginal prolapse: Secondary | ICD-10-CM | POA: Diagnosis not present

## 2014-02-12 DIAGNOSIS — N760 Acute vaginitis: Secondary | ICD-10-CM | POA: Diagnosis not present

## 2014-02-12 DIAGNOSIS — N812 Incomplete uterovaginal prolapse: Secondary | ICD-10-CM | POA: Diagnosis not present

## 2014-02-13 DIAGNOSIS — N760 Acute vaginitis: Secondary | ICD-10-CM | POA: Diagnosis not present

## 2014-02-13 DIAGNOSIS — N812 Incomplete uterovaginal prolapse: Secondary | ICD-10-CM | POA: Diagnosis not present

## 2014-03-02 DIAGNOSIS — E782 Mixed hyperlipidemia: Secondary | ICD-10-CM | POA: Diagnosis not present

## 2014-03-02 DIAGNOSIS — E039 Hypothyroidism, unspecified: Secondary | ICD-10-CM | POA: Diagnosis not present

## 2014-03-02 DIAGNOSIS — R7989 Other specified abnormal findings of blood chemistry: Secondary | ICD-10-CM | POA: Diagnosis not present

## 2014-03-02 DIAGNOSIS — N951 Menopausal and female climacteric states: Secondary | ICD-10-CM | POA: Diagnosis not present

## 2014-03-03 DIAGNOSIS — N813 Complete uterovaginal prolapse: Secondary | ICD-10-CM | POA: Diagnosis not present

## 2014-03-03 DIAGNOSIS — N812 Incomplete uterovaginal prolapse: Secondary | ICD-10-CM | POA: Diagnosis not present

## 2014-03-18 DIAGNOSIS — N951 Menopausal and female climacteric states: Secondary | ICD-10-CM | POA: Diagnosis not present

## 2014-04-01 DIAGNOSIS — N812 Incomplete uterovaginal prolapse: Secondary | ICD-10-CM | POA: Diagnosis not present

## 2014-05-27 DIAGNOSIS — N812 Incomplete uterovaginal prolapse: Secondary | ICD-10-CM | POA: Diagnosis not present

## 2014-06-06 DIAGNOSIS — N951 Menopausal and female climacteric states: Secondary | ICD-10-CM | POA: Diagnosis not present

## 2014-06-22 DIAGNOSIS — E039 Hypothyroidism, unspecified: Secondary | ICD-10-CM | POA: Diagnosis not present

## 2014-06-22 DIAGNOSIS — N951 Menopausal and female climacteric states: Secondary | ICD-10-CM | POA: Diagnosis not present

## 2014-06-22 DIAGNOSIS — R7989 Other specified abnormal findings of blood chemistry: Secondary | ICD-10-CM | POA: Diagnosis not present

## 2014-06-22 DIAGNOSIS — E559 Vitamin D deficiency, unspecified: Secondary | ICD-10-CM | POA: Diagnosis not present

## 2014-06-22 DIAGNOSIS — E782 Mixed hyperlipidemia: Secondary | ICD-10-CM | POA: Diagnosis not present

## 2014-06-30 ENCOUNTER — Ambulatory Visit (INDEPENDENT_AMBULATORY_CARE_PROVIDER_SITE_OTHER): Payer: Medicare Other | Admitting: Emergency Medicine

## 2014-06-30 VITALS — BP 134/82 | HR 92 | Temp 97.9°F | Resp 18 | Ht 62.0 in | Wt 128.0 lb

## 2014-06-30 DIAGNOSIS — M5430 Sciatica, unspecified side: Secondary | ICD-10-CM | POA: Diagnosis not present

## 2014-06-30 MED ORDER — TRAMADOL HCL 50 MG PO TABS: 50.0000 mg | ORAL_TABLET | Freq: Three times a day (TID) | ORAL | Status: DC | PRN

## 2014-06-30 MED ORDER — MELOXICAM 15 MG PO TABS: 15.0000 mg | ORAL_TABLET | Freq: Every day | ORAL | Status: DC

## 2014-06-30 NOTE — Progress Notes (Signed)
[facility] [address], [place] [address] [phone]  Date:  06/30/2014   Name:  Patricia Beck   DOB:  1940-04-20   MRN:  [id number]  PCP:  No primary care provider on file.    Chief Complaint: Insomnia and Back Pain   History of Present Illness:  Patricia Beck is a 74 y.o. very pleasant female patient who presents with the following:  No history of injury.  Has 10 year history of intermittent pain in right leg Starts in sciatic notch and frequently in lateral calf No numbness tingling or weakness. Says pain is worse when lays down at night. Less when up and around Has moderate scoliosis Previously evaluated but has no knowledge of outcome. Says worse recently and interfering with sleep No improvement with over the counter medications or other home remedies.  Denies other complaint or health concern today.   Patient Active Problem List   Diagnosis Date Noted  . 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 Fibroma 2006  . Hypertension   . Thyroid disease   . Cataract     Past Surgical History  Procedure Laterality Date  . Fibroma    . Eye surgery      History  Substance Use Topics  . Smoking status: Never Smoker   . Smokeless tobacco: Not on file  . Alcohol Use: No    Family History  Problem Relation Age of Onset  . Diabetes Father     Allergies  Allergen Reactions  . Aciphex [Rabeprazole Sodium]   . Aleve [Naproxen Sodium]   . Atarax [Hydroxyzine]   . Codeine   . Erythromycin   . Keflex [Cephalexin]   . Lidocaine     Medication list has been reviewed and updated.  No current outpatient prescriptions on file prior to visit.   No current facility-administered medications on file prior to visit.    Review of Systems:  As per HPI, otherwise negative.    Physical Examination: Filed Vitals:   06/30/14 1414  BP: 134/82  Pulse: 92  Temp: 97.9 F (36.6 C)  Resp: 18   Filed  Vitals:   06/30/14 1414  Height: 5\' 2"  (1.575 m)  Weight: 128 lb (58.06 kg)   Body mass index is 23.41 kg/(m^2). Ideal Body Weight: Weight in (lb) to have BMI = 25: 136.4  GEN: WDWN, NAD, Non-toxic, A & O x 3 HEENT: Atraumatic, Normocephalic. Neck supple. No masses, No LAD. Ears and Nose: No external deformity. CV: RRR, No M/G/R. No JVD. No thrill. No extra heart sounds. PULM: CTA B, no wheezes, crackles, rhonchi. No retractions. No resp. distress. No accessory muscle use. ABD: S, NT, ND, +BS. No rebound. No HSM. EXTR: No c/c/e NEURO Normal gait.  Gross motor intact.  DTR's diminished on right PSYCH: Normally interactive. Conversant. Not depressed or anxious appearing.  Calm demeanor.    Assessment and Plan: Sciatic neuritis Anaprox Ultram MR   Signed,  Ellison Carwin, MD

## 2014-06-30 NOTE — Patient Instructions (Signed)
Sciatica Sciatica is pain, weakness, numbness, or tingling along the path of the sciatic nerve. The nerve starts in the lower back and runs down the back of each leg. The nerve controls the muscles in the lower leg and in the back of the knee, while also providing sensation to the back of the thigh, lower leg, and the sole of your foot. Sciatica is a symptom of another medical condition. For instance, nerve damage or certain conditions, such as a herniated disk or bone spur on the spine, pinch or put pressure on the sciatic nerve. This causes the pain, weakness, or other sensations normally associated with sciatica. Generally, sciatica only affects one side of the body. CAUSES   Herniated or slipped disc.  Degenerative disk disease.  A pain disorder involving the narrow muscle in the buttocks (piriformis syndrome).  Pelvic injury or fracture.  Pregnancy.  Tumor (rare). SYMPTOMS  Symptoms can vary from mild to very severe. The symptoms usually travel from the low back to the buttocks and down the back of the leg. Symptoms can include:  Mild tingling or dull aches in the lower back, leg, or hip.  Numbness in the back of the calf or sole of the foot.  Burning sensations in the lower back, leg, or hip.  Sharp pains in the lower back, leg, or hip.  Leg weakness.  Severe back pain inhibiting movement. These symptoms may get worse with coughing, sneezing, laughing, or prolonged sitting or standing. Also, being overweight may worsen symptoms. DIAGNOSIS  Your caregiver will perform a physical exam to look for common symptoms of sciatica. He or she may ask you to do certain movements or activities that would trigger sciatic nerve pain. Other tests may be performed to find the cause of the sciatica. These may include:  Blood tests.  X-rays.  Imaging tests, such as an MRI or CT scan. TREATMENT  Treatment is directed at the cause of the sciatic pain. Sometimes, treatment is not necessary  and the pain and discomfort goes away on its own. If treatment is needed, your caregiver may suggest:  Over-the-counter medicines to relieve pain.  Prescription medicines, such as anti-inflammatory medicine, muscle relaxants, or narcotics.  Applying heat or ice to the painful area.  Steroid injections to lessen pain, irritation, and inflammation around the nerve.  Reducing activity during periods of pain.  Exercising and stretching to strengthen your abdomen and improve flexibility of your spine. Your caregiver may suggest losing weight if the extra weight makes the back pain worse.  Physical therapy.  Surgery to eliminate what is pressing or pinching the nerve, such as a bone spur or part of a herniated disk. HOME CARE INSTRUCTIONS   Only take over-the-counter or prescription medicines for pain or discomfort as directed by your caregiver.  Apply ice to the affected area for 20 minutes, 3-4 times a day for the first 48-72 hours. Then try heat in the same way.  Exercise, stretch, or perform your usual activities if these do not aggravate your pain.  Attend physical therapy sessions as directed by your caregiver.  Keep all follow-up appointments as directed by your caregiver.  Do not wear high heels or shoes that do not provide proper support.  Check your mattress to see if it is too soft. A firm mattress may lessen your pain and discomfort. SEEK IMMEDIATE MEDICAL CARE IF:   You lose control of your bowel or bladder (incontinence).  You have increasing weakness in the lower back, pelvis, buttocks,   or legs.  You have redness or swelling of your back.  You have a burning sensation when you urinate.  You have pain that gets worse when you lie down or awakens you at night.  Your pain is worse than you have experienced in the past.  Your pain is lasting longer than 4 weeks.  You are suddenly losing weight without reason. MAKE SURE YOU:  Understand these  instructions.  Will watch your condition.  Will get help right away if you are not doing well or get worse. Document Released: 03/28/2001 Document Revised: 10/03/2011 Document Reviewed: 08/13/2011 [employer] Patient Information 2015 [employer]. This information is not intended to replace advice given to you by your health care provider. Make sure you discuss any questions you have with your health care provider.  

## 2014-07-01 ENCOUNTER — Telehealth: Payer: Self-pay

## 2014-07-01 DIAGNOSIS — M5430 Sciatica, unspecified side: Secondary | ICD-10-CM

## 2014-07-01 NOTE — Telephone Encounter (Signed)
Assessment and Plan: Sciatic neuritis Anaprox Ultram MR   Spoke with pt, she thinks she having a reaction to both. Can we Rx her something else? She has discontinued the meds. Please advise.

## 2014-07-01 NOTE — Telephone Encounter (Signed)
Reaction to meloxicam...she is having extreme itching. She says the tramadol feels "edgy and unsafe" She says she does not want to RTC

## 2014-07-01 NOTE — Telephone Encounter (Signed)
Tylenol.  Benadryl

## 2014-07-02 MED ORDER — CELECOXIB 200 MG PO CAPS
200.0000 mg | ORAL_CAPSULE | Freq: Two times a day (BID) | ORAL | Status: DC
Start: 2014-07-02 — End: 2014-08-01

## 2014-07-02 NOTE — Telephone Encounter (Signed)
Pt reported that she has been using tylenol and it is not helping at all even at higher doses. I advised her that the benedryl may help her sleep as well as help w/any residual itching. She has also tried benedryl in the past for sleeping and it has not worked well. I asked her about Neurontin and she has also tried/failed this in the past. She stated she is not able to take NSAIDS d/t gastritis. Do you think that celebrex would be something that might work for pt? I suggested referral to ortho while waiting for MRI results since she is so difficult to treat w/meds. Please advise again.

## 2014-07-02 NOTE — Telephone Encounter (Signed)
Why not try 200 mg celebrex BID

## 2014-07-02 NOTE — Telephone Encounter (Signed)
Sent in celebrex Rx as ordered and also put in a referral for ortho. Called pt to advise and she agreed to try the celebrex and ortho ref.

## 2014-07-08 DIAGNOSIS — N812 Incomplete uterovaginal prolapse: Secondary | ICD-10-CM | POA: Diagnosis not present

## 2014-07-10 ENCOUNTER — Ambulatory Visit
Admission: RE | Admit: 2014-07-10 | Discharge: 2014-07-10 | Disposition: A | Discharge status: Home / Self Care | Payer: Medicare Other | Source: Ambulatory Visit | Attending: Emergency Medicine | Admitting: Emergency Medicine

## 2014-07-10 DIAGNOSIS — M4316 Spondylolisthesis, lumbar region: Secondary | ICD-10-CM | POA: Diagnosis not present

## 2014-07-10 DIAGNOSIS — M4806 Spinal stenosis, lumbar region: Secondary | ICD-10-CM | POA: Diagnosis not present

## 2014-07-10 DIAGNOSIS — M5137 Other intervertebral disc degeneration, lumbosacral region: Secondary | ICD-10-CM | POA: Diagnosis not present

## 2014-07-10 DIAGNOSIS — M5430 Sciatica, unspecified side: Secondary | ICD-10-CM

## 2014-07-10 DIAGNOSIS — M47817 Spondylosis without myelopathy or radiculopathy, lumbosacral region: Secondary | ICD-10-CM | POA: Diagnosis not present

## 2014-07-11 ENCOUNTER — Other Ambulatory Visit: Payer: Self-pay | Admitting: Emergency Medicine

## 2014-07-11 DIAGNOSIS — M5430 Sciatica, unspecified side: Secondary | ICD-10-CM

## 2014-07-15 DIAGNOSIS — N951 Menopausal and female climacteric states: Secondary | ICD-10-CM | POA: Diagnosis not present

## 2014-07-17 DIAGNOSIS — H26491 Other secondary cataract, right eye: Secondary | ICD-10-CM | POA: Diagnosis not present

## 2014-07-22 DIAGNOSIS — M545 Low back pain: Secondary | ICD-10-CM | POA: Diagnosis not present

## 2014-07-22 DIAGNOSIS — M4126 Other idiopathic scoliosis, lumbar region: Secondary | ICD-10-CM | POA: Diagnosis not present

## 2014-07-22 DIAGNOSIS — M4316 Spondylolisthesis, lumbar region: Secondary | ICD-10-CM | POA: Diagnosis not present

## 2014-07-30 ENCOUNTER — Encounter (HOSPITAL_COMMUNITY): Payer: Self-pay | Admitting: Emergency Medicine

## 2014-07-30 ENCOUNTER — Inpatient Hospital Stay (HOSPITAL_COMMUNITY)
Admission: EM | Admit: 2014-07-30 | Discharge: 2014-08-01 | DRG: 641 | Disposition: A | Discharge status: Home / Self Care | Payer: Medicare Other | Attending: Internal Medicine | Admitting: Internal Medicine

## 2014-07-30 DIAGNOSIS — R112 Nausea with vomiting, unspecified: Secondary | ICD-10-CM | POA: Diagnosis not present

## 2014-07-30 DIAGNOSIS — Z79899 Other long term (current) drug therapy: Secondary | ICD-10-CM | POA: Diagnosis not present

## 2014-07-30 DIAGNOSIS — E871 Hypo-osmolality and hyponatremia: Secondary | ICD-10-CM | POA: Diagnosis not present

## 2014-07-30 DIAGNOSIS — G8929 Other chronic pain: Secondary | ICD-10-CM | POA: Diagnosis present

## 2014-07-30 DIAGNOSIS — M545 Low back pain: Secondary | ICD-10-CM | POA: Diagnosis not present

## 2014-07-30 DIAGNOSIS — E878 Other disorders of electrolyte and fluid balance, not elsewhere classified: Secondary | ICD-10-CM | POA: Diagnosis not present

## 2014-07-30 DIAGNOSIS — E039 Hypothyroidism, unspecified: Secondary | ICD-10-CM

## 2014-07-30 DIAGNOSIS — N390 Urinary tract infection, site not specified: Secondary | ICD-10-CM

## 2014-07-30 DIAGNOSIS — I10 Essential (primary) hypertension: Secondary | ICD-10-CM | POA: Diagnosis not present

## 2014-07-30 DIAGNOSIS — M549 Dorsalgia, unspecified: Secondary | ICD-10-CM

## 2014-07-30 DIAGNOSIS — M4126 Other idiopathic scoliosis, lumbar region: Secondary | ICD-10-CM | POA: Diagnosis not present

## 2014-07-30 DIAGNOSIS — E876 Hypokalemia: Secondary | ICD-10-CM | POA: Diagnosis not present

## 2014-07-30 DIAGNOSIS — G4700 Insomnia, unspecified: Secondary | ICD-10-CM | POA: Diagnosis present

## 2014-07-30 DIAGNOSIS — M4316 Spondylolisthesis, lumbar region: Secondary | ICD-10-CM | POA: Diagnosis not present

## 2014-07-30 LAB — PROTIME-INR
INR: 1.05 (ref 0.00–1.49)
PROTHROMBIN TIME: 13.9 s (ref 11.6–15.2)

## 2014-07-30 LAB — CBC WITH DIFFERENTIAL/PLATELET
BASOS PCT: 0 % (ref 0–1)
Basophils Absolute: 0 10*3/uL (ref 0.0–0.1)
EOS ABS: 0 10*3/uL (ref 0.0–0.7)
Eosinophils Relative: 0 % (ref 0–5)
HCT: 39.3 % (ref 36.0–46.0)
HEMOGLOBIN: 14.4 g/dL (ref 12.0–15.0)
Lymphocytes Relative: 6 % — ABNORMAL LOW (ref 12–46)
Lymphs Abs: 1.1 10*3/uL (ref 0.7–4.0)
MCH: 31.2 pg (ref 26.0–34.0)
MCHC: 36.6 g/dL — AB (ref 30.0–36.0)
MCV: 85.1 fL (ref 78.0–100.0)
MONOS PCT: 8 % (ref 3–12)
Monocytes Absolute: 1.3 10*3/uL — ABNORMAL HIGH (ref 0.1–1.0)
NEUTROS PCT: 86 % — AB (ref 43–77)
Neutro Abs: 14.2 10*3/uL — ABNORMAL HIGH (ref 1.7–7.7)
PLATELETS: 287 10*3/uL (ref 150–400)
RBC: 4.62 MIL/uL (ref 3.87–5.11)
RDW: 12.6 % (ref 11.5–15.5)
WBC: 16.6 10*3/uL — ABNORMAL HIGH (ref 4.0–10.5)

## 2014-07-30 LAB — COMPREHENSIVE METABOLIC PANEL
ALT: 21 U/L (ref 0–35)
AST: 36 U/L (ref 0–37)
Albumin: 3.5 g/dL (ref 3.5–5.2)
Alkaline Phosphatase: 55 U/L (ref 39–117)
Anion gap: 14 (ref 5–15)
BILIRUBIN TOTAL: 1 mg/dL (ref 0.3–1.2)
BUN: 11 mg/dL (ref 6–23)
CHLORIDE: 75 mmol/L — AB (ref 96–112)
CO2: 22 mmol/L (ref 19–32)
Calcium: 7.6 mg/dL — ABNORMAL LOW (ref 8.4–10.5)
Creatinine, Ser: 0.41 mg/dL — ABNORMAL LOW (ref 0.50–1.10)
GFR calc Af Amer: >90 mL/min (ref >90)
Glucose, Bld: 127 mg/dL — ABNORMAL HIGH (ref 70–99)
POTASSIUM: 2.6 mmol/L — AB (ref 3.5–5.1)
SODIUM: 111 mmol/L — AB (ref 135–145)
Total Protein: 7 g/dL (ref 6.0–8.3)

## 2014-07-30 LAB — I-STAT TROPONIN, ED: Troponin i, poc: 0.01 ng/mL (ref 0.00–0.08)

## 2014-07-30 LAB — TSH: TSH: 1.341 u[IU]/mL (ref 0.350–4.500)

## 2014-07-30 LAB — BASIC METABOLIC PANEL
Anion gap: 11 (ref 5–15)
BUN: 9 mg/dL (ref 6–23)
CALCIUM: 7 mg/dL — AB (ref 8.4–10.5)
CO2: 24 mmol/L (ref 19–32)
Chloride: 83 mmol/L — ABNORMAL LOW (ref 96–112)
Creatinine, Ser: 0.47 mg/dL — ABNORMAL LOW (ref 0.50–1.10)
GFR calc Af Amer: >90 mL/min (ref >90)
GFR calc non Af Amer: >90 mL/min (ref >90)
GLUCOSE: 125 mg/dL — AB (ref 70–99)
Potassium: 2.8 mmol/L — ABNORMAL LOW (ref 3.5–5.1)
Sodium: 118 mmol/L — CL (ref 135–145)

## 2014-07-30 LAB — URINALYSIS, ROUTINE W REFLEX MICROSCOPIC
BILIRUBIN URINE: NEGATIVE
Glucose, UA: NEGATIVE mg/dL
HGB URINE DIPSTICK: NEGATIVE
KETONES UR: 15 mg/dL — AB
Nitrite: NEGATIVE
Protein, ur: NEGATIVE mg/dL
Specific Gravity, Urine: 1.01 (ref 1.005–1.030)
Urobilinogen, UA: 0.2 mg/dL (ref 0.0–1.0)
pH: 7.5 (ref 5.0–8.0)

## 2014-07-30 LAB — LIPASE, BLOOD: LIPASE: 18 U/L (ref 11–59)

## 2014-07-30 LAB — URINE MICROSCOPIC-ADD ON

## 2014-07-30 LAB — MAGNESIUM: Magnesium: 2 mg/dL (ref 1.5–2.5)

## 2014-07-30 MED ORDER — MELATONIN 3 MG PO TABS: 15.0000 mg | ORAL_TABLET | Freq: Every day | ORAL | Status: DC

## 2014-07-30 MED ORDER — ESTRADIOL 1 MG PO TABS
0.5000 mg | ORAL_TABLET | Freq: Every day | ORAL | Status: DC
  Administered 2014-07-30 – 2014-07-31 (×2): 0.5 mg via ORAL
  Filled 2014-07-30 (×4): qty 0.5

## 2014-07-30 MED ORDER — SODIUM CHLORIDE 0.9 % IV SOLN
INTRAVENOUS | Status: DC
Start: 2014-07-30 — End: 2014-07-30
  Administered 2014-07-30: 20:00:00 via INTRAVENOUS

## 2014-07-30 MED ORDER — AZTREONAM 1 G IJ SOLR: 1.0000 g | Freq: Two times a day (BID) | INTRAMUSCULAR | Status: DC

## 2014-07-30 MED ORDER — POTASSIUM CHLORIDE 10 MEQ/100ML IV SOLN
10.0000 meq | Freq: Once | INTRAVENOUS | Status: AC
Start: 2014-07-30 — End: 2014-07-30
  Administered 2014-07-30: 10 meq via INTRAVENOUS
  Filled 2014-07-30: qty 100

## 2014-07-30 MED ORDER — POTASSIUM CHLORIDE CRYS ER 20 MEQ PO TBCR
40.0000 meq | EXTENDED_RELEASE_TABLET | Freq: Once | ORAL | Status: AC
  Administered 2014-07-30: 40 meq via ORAL
  Filled 2014-07-30: qty 2

## 2014-07-30 MED ORDER — VITAMIN E 180 MG (400 UNIT) PO CAPS
400.0000 [IU] | ORAL_CAPSULE | Freq: Every day | ORAL | Status: DC
  Administered 2014-07-31 – 2014-08-01 (×2): 400 [IU] via ORAL
  Filled 2014-07-30 (×2): qty 1

## 2014-07-30 MED ORDER — SODIUM CHLORIDE 0.9 % IV BOLUS (SEPSIS)
1000.0000 mL | Freq: Once | INTRAVENOUS | Status: AC
  Administered 2014-07-30: 1000 mL via INTRAVENOUS

## 2014-07-30 MED ORDER — THYROID 60 MG PO TABS
60.0000 mg | ORAL_TABLET | Freq: Every day | ORAL | Status: DC
  Administered 2014-07-31 – 2014-08-01 (×2): 60 mg via ORAL
  Filled 2014-07-30 (×3): qty 1

## 2014-07-30 MED ORDER — VITAMIN D 1000 UNITS PO TABS
5000.0000 [IU] | ORAL_TABLET | Freq: Every day | ORAL | Status: DC
  Administered 2014-07-31 – 2014-08-01 (×2): 5000 [IU] via ORAL
  Filled 2014-07-30 (×2): qty 5

## 2014-07-30 MED ORDER — PROMETHAZINE HCL 25 MG/ML IJ SOLN: 12.5000 mg | Freq: Three times a day (TID) | INTRAMUSCULAR | Status: DC | PRN

## 2014-07-30 MED ORDER — HYDRALAZINE HCL 20 MG/ML IJ SOLN: 5.0000 mg | INTRAMUSCULAR | Status: DC | PRN

## 2014-07-30 MED ORDER — DIAZEPAM 5 MG/ML IJ SOLN
5.0000 mg | Freq: Once | INTRAMUSCULAR | Status: AC
  Administered 2014-07-30: 5 mg via INTRAVENOUS
  Filled 2014-07-30: qty 2

## 2014-07-30 MED ORDER — VITAMIN C 500 MG PO TABS
1000.0000 mg | ORAL_TABLET | Freq: Every day | ORAL | Status: DC
  Administered 2014-07-31 – 2014-08-01 (×2): 1000 mg via ORAL
  Filled 2014-07-30 (×2): qty 2

## 2014-07-30 MED ORDER — ZOLPIDEM TARTRATE 5 MG PO TABS
5.0000 mg | ORAL_TABLET | Freq: Every evening | ORAL | Status: DC | PRN
  Administered 2014-07-30 – 2014-07-31 (×2): 5 mg via ORAL
  Filled 2014-07-30 (×2): qty 1

## 2014-07-30 MED ORDER — DEXTROSE 5 % IV SOLN
1.0000 g | Freq: Two times a day (BID) | INTRAVENOUS | Status: DC
  Administered 2014-07-30 – 2014-07-31 (×2): 1 g via INTRAVENOUS
  Filled 2014-07-30 (×3): qty 1

## 2014-07-30 MED ORDER — SODIUM CHLORIDE 0.9 % IV SOLN: INTRAVENOUS | Status: DC

## 2014-07-30 MED ORDER — CALCIUM CITRATE 950 (200 CA) MG PO TABS
200.0000 mg | ORAL_TABLET | Freq: Every day | ORAL | Status: DC
  Administered 2014-07-31 – 2014-08-01 (×2): 200 mg via ORAL
  Filled 2014-07-30 (×3): qty 1

## 2014-07-30 MED ORDER — ONDANSETRON HCL 4 MG/2ML IJ SOLN
4.0000 mg | Freq: Once | INTRAMUSCULAR | Status: AC
  Administered 2014-07-30: 4 mg via INTRAVENOUS
  Filled 2014-07-30: qty 2

## 2014-07-30 MED ORDER — ALUM & MAG HYDROXIDE-SIMETH 200-200-20 MG/5ML PO SUSP: 30.0000 mL | Freq: Four times a day (QID) | ORAL | Status: DC | PRN

## 2014-07-30 MED ORDER — OXYCODONE-ACETAMINOPHEN 5-325 MG PO TABS
1.0000 | ORAL_TABLET | ORAL | Status: DC | PRN
  Administered 2014-07-30 – 2014-07-31 (×4): 1 via ORAL
  Filled 2014-07-30 (×4): qty 1

## 2014-07-30 MED ORDER — SODIUM CHLORIDE 0.9 % IJ SOLN
3.0000 mL | Freq: Two times a day (BID) | INTRAMUSCULAR | Status: DC
  Administered 2014-07-31: 3 mL via INTRAVENOUS

## 2014-07-30 MED ORDER — ACETAMINOPHEN 325 MG PO TABS: 650.0000 mg | ORAL_TABLET | Freq: Four times a day (QID) | ORAL | Status: DC | PRN

## 2014-07-30 MED ORDER — HEPARIN SODIUM (PORCINE) 5000 UNIT/ML IJ SOLN
5000.0000 [IU] | Freq: Three times a day (TID) | INTRAMUSCULAR | Status: DC
  Administered 2014-07-30 – 2014-08-01 (×6): 5000 [IU] via SUBCUTANEOUS
  Filled 2014-07-30 (×5): qty 1

## 2014-07-30 MED ORDER — POTASSIUM CHLORIDE 10 MEQ/100ML IV SOLN
10.0000 meq | INTRAVENOUS | Status: AC
  Administered 2014-07-30 (×3): 10 meq via INTRAVENOUS
  Filled 2014-07-30 (×3): qty 100

## 2014-07-30 MED ORDER — HYDROMORPHONE HCL 1 MG/ML IJ SOLN
0.5000 mg | INTRAMUSCULAR | Status: DC | PRN
  Administered 2014-07-31: 0.5 mg via INTRAVENOUS
  Filled 2014-07-30: qty 1

## 2014-07-30 NOTE — ED Notes (Signed)
Pt was seen recently for back pain and was prescribed prednisone. Pt took the prednisone for 3 days and sts "It was poisoning me." Pt sts that she felt nauseous and has been unable to sleep while taking prednisone. Pt saw her PCP today and was told she was dehydrated and needed to come to ER. Pt sts she didn't finish her prednisone, stopped mid treatment. Pt A&Ox4.

## 2014-07-30 NOTE — Progress Notes (Signed)
PHARMACIST - PHYSICIAN ORDER COMMUNICATION  CONCERNING: P&T Medication Policy on Herbal Medications  DESCRIPTION:  This patient's order for:  Melatonin  has been noted.  This product(s) is classified as an "herbal" or natural product. Due to a lack of definitive safety studies or FDA approval, nonstandard manufacturing practices, plus the potential risk of unknown drug-drug interactions while on inpatient medications, the Pharmacy and Therapeutics Committee does not permit the use of "herbal" or natural products of this type within [facility]   ACTION TAKEN: The pharmacy department is unable to verify this order at this time and your patient has been informed of this safety policy. Please reevaluate patient's clinical condition at discharge and address if the herbal or natural product(s) should be resumed at that time.   Thank you, Peggyann Juba, PharmD, BCPS 07/30/2014 8:58 PM

## 2014-07-30 NOTE — H&P (Addendum)
[facility] History and Physical  Patricia Beck GUR:[id number] DOB: April 01, 1941 DOA: 07/30/2014  Referring physician: ED physician PCP: Pcp Not In System  Specialists:   Chief Complaint: Nausea, vomiting, suprapubic abdominal pain, insomnia  HPI: Patricia Beck is a 74 y.o. female with a past medical history of hypertension, hypothyroidism, chronic back pain, cataract, who presents with nausea, vomiting, suprapubic abdominal pain and insomnia.  Patient reports that he was started with low-dose of prednisone for her chronic back pain 4 days ago. After she started taking prednisone, she developed insomnia, nausea, vomiting, hot flush. She stopped taking this medications. But she continues to have nausea and vomiting. He vomited 3-4 times today without blood in the vomitus. She had very poor sleep in the past 2 days. She reports that she has chronic back pain, which has not changed in severity, no alarming symptoms, such as urinary incontinence or lower extremity weakness. She reports that she has increased urinary frequency, which she attributs to having drinken a lot of fluid. No dysuria or burning on urination. Patient also has suprapubic abdominal pain. It is mild, intermittent and nonradiating. She reports that she had very tiny amount of loose stools twice today, but no diarrhea now.  ROS: currently patient denies fever, chills, running nose, ear pain, headaches, cough, chest pain, SOB, hematuria, skin rashes or leg swelling. No unilateral weakness, numbness or tingling sensations. No vision change or hearing loss.  In ED, patient was found to have positive urinalysis with small amount of leukocytes, troponin negative, WBC 16.6 (patient took prednisone for 4 days), lipase 18, sodium of 111, potassium 2.6, chloride 75, bicarbonate 22, renal function normal, temperature 97.5, no tachycardia. Patient is admitted to inpatient for further evaluation and treatment.  Review of Systems: As  presented in the history of presenting illness, rest negative.  Where does patient live? At home  Can patient participate in ADLs? Yes  Allergy:  Allergies  Allergen Reactions  . Aciphex [Rabeprazole Sodium]   . Aleve [Naproxen Sodium]   . Atarax [Hydroxyzine]   . Codeine   . Erythromycin   . Keflex [Cephalexin]   . Lidocaine   . Prednisone     Insomnia, nausea, hot flush     Past Medical History  Diagnosis Date  . Fibroma 2006  . Hypertension   . Thyroid disease   . Cataract     Past Surgical History  Procedure Laterality Date  . Fibroma    . Eye surgery      Social History:  reports that she has never smoked. She does not have any smokeless tobacco history on file. She reports that she does not drink alcohol or use illicit drugs.  Family History:  Family History  Problem Relation Age of Onset  . Diabetes Father      Prior to Admission medications   Medication Sig Start Date End Date Taking? Authorizing Provider  acetaminophen (TYLENOL) 500 MG tablet Take 1,000 mg by mouth every 6 (six) hours as needed for moderate pain (pain).    Yes Historical Provider, MD  Ascorbic Acid (VITAMIN C) 1000 MG tablet Take 1,000 mg by mouth daily.   Yes Historical Provider, MD  calcium citrate (CALCITRATE - DOSED IN MG ELEMENTAL CALCIUM) 950 MG tablet Take 200 mg of elemental calcium by mouth daily.   Yes Historical Provider, MD  cholecalciferol (VITAMIN D) 1000 UNITS tablet Take 5,000 Units by mouth daily.   Yes Historical Provider, MD  estradiol (ESTRACE) 0.5 MG tablet Take 0.5  mg by mouth at bedtime.    Yes Historical Provider, MD  Melatonin 3 MG TABS Take 15-18 mg by mouth at bedtime.   Yes Historical Provider, MD  predniSONE (DELTASONE) 5 MG tablet Take 5 mg by mouth daily with breakfast.   Yes Historical Provider, MD  thyroid (ARMOUR) 60 MG tablet Take 60 mg by mouth daily before breakfast.   Yes Historical Provider, MD  vitamin E 400 UNIT capsule Take 400 Units by mouth  daily.   Yes Historical Provider, MD  celecoxib (CELEBREX) 200 MG capsule Take 1 capsule (200 mg total) by mouth 2 (two) times daily. Patient not taking: Reported on 07/30/2014 07/02/14   Roselee Culver, MD  meloxicam (MOBIC) 15 MG tablet Take 1 tablet (15 mg total) by mouth daily. Patient not taking: Reported on 07/30/2014 06/30/14   Roselee Culver, MD  traMADol (ULTRAM) 50 MG tablet Take 1 tablet (50 mg total) by mouth every 8 (eight) hours as needed. Patient not taking: Reported on 07/30/2014 06/30/14   Roselee Culver, MD    Physical Exam: Filed Vitals:   07/30/14 1900 07/30/14 1915 07/30/14 1930 07/30/14 1945  BP: 163/81 160/85 160/78 170/89  Pulse: 74 75 74 78  Temp:      TempSrc:      Resp: 17 13 14 18   SpO2:       General: Not in acute distress. HEENT:       Eyes: PERRL, EOMI, no scleral icterus       ENT: No discharge from the ears and nose, no pharynx injection, no tonsillar enlargement.        Neck: No JVD, no bruit, no mass felt. Cardiac: S1/S2, RRR, No murmurs, No gallops or rubs Pulm: Good air movement bilaterally. Clear to auscultation bilaterally. No rales, wheezing, rhonchi or rubs. Abd: Soft, distended, mild tender over suprapubic area, no rebound pain, no organomegaly, BS present. No CVA tenderness bilaterally. Ext: No edema bilaterally. 2+DP/PT pulse bilaterally Musculoskeletal: No joint deformities, erythema, or stiffness, ROM full Skin: No rashes.  Neuro: Alert and oriented X3, cranial nerves II-XII grossly intact, muscle strength 5/5 in all extremeties, sensation to light touch intact. Brachial reflex 2+ bilaterally. Knee reflex 1+ bilaterally. Negative Babinski's sign. Normal finger to nose test. Psych: Patient is not psychotic, no suicidal or hemocidal ideation.  Labs on Admission:  Basic Metabolic Panel:  Recent Labs Lab 07/30/14 1646 07/30/14 1831  NA 111*  --   K 2.6*  --   CL 75*  --   CO2 22  --   GLUCOSE 127*  --   BUN 11  --    CREATININE 0.41*  --   CALCIUM 7.6*  --   MG  --  2.0   Liver Function Tests:  Recent Labs Lab 07/30/14 1646  AST 36  ALT 21  ALKPHOS 55  BILITOT 1.0  PROT 7.0  ALBUMIN 3.5    Recent Labs Lab 07/30/14 1646  LIPASE 18   No results for input(s): AMMONIA in the last 168 hours. CBC:  Recent Labs Lab 07/30/14 1646  WBC 16.6*  NEUTROABS 14.2*  HGB 14.4  HCT 39.3  MCV 85.1  PLT 287   Cardiac Enzymes: No results for input(s): CKTOTAL, CKMB, CKMBINDEX, TROPONINI in the last 168 hours.  BNP (last 3 results) No results for input(s): BNP in the last 8760 hours.  ProBNP (last 3 results) No results for input(s): PROBNP in the last 8760 hours.  CBG: No results for input(s):  GLUCAP in the last 168 hours.  Radiological Exams on Admission: No results found.  EKG: Independently reviewed.  Abnormal findings:  Old right bundle blockage, which existed in previous EKG on 12/11/2008, QTC 533   Assessment/Plan Principal Problem:   Nausea & vomiting Active Problems:   Hypertension   Hypothyroidism   Hyponatremia   Hypokalemia   Hypochloremia   UTI (lower urinary tract infection)   Chronic back pain   Insomnia  Repeat BMP at 11:40 PM showed Na111-->118-->120, which was over corrected (decreased by 9 in 7 hours). Will start D5 at 50 cc/h and repeat BMP at 3:00 AM. Will target Na at about 116 at 3:00 AM. I spoke with Mid Level coverage PA, T. Rogue Bussing who will follow up BMP at 3:00 AM.   BMP at 21:00 PM showed Na=118<--111.  -will d/c IV NS and check BMP again.   Nausea, vomiting: Patient reports that her symptoms started after she started taking prednisone, which might be the etiology. Patient also has mild urinary tract infection with increased urinary frequency and positive urinalysis, which may have also contributed. Now patient has severe electrolytes disturbance, with hyponatremia, hypokalemia and hypochloremia. -will admit to tele bed -treat underlying problems as  below -Phenergan prn for nausea (patient has QT prolongation at 533, Zofran cannot be used). -check EKG in AM for monitoring QTc  UTI: patient has an increased urinary frequency and positive urinalysis. Her suprapubic abdominal pain is also likely caused by UTI. Lipase is negative. I would not pursue further evaluation for abdominal pain at this moment. But if patient does not response to antibiotic treatment for UTI, will get CT abdomen later. Patient is not septic on admission. -IV aztreonam -follow up blood culture and urine culture  Electrolytes disturbance: Sodium 111 without mental status change. Hyponatremia is likely acute issue. Potassium 2.6, chloride 75. These are all likely caused by vomiting. -check serum and urine Osmo and urine Na level -check TSH -IVF: received NS bolus 1000 ml in ED, will followed by 100 cc/h, will try to correct Sodium by <10 mEq/24/hour -check cortisol level  -Get BMP at 11.30 PM and 5:00 AM and adjust IVF NS rate according to the sodium level then -repleted K -expect hypochloremia be corrected with IV normal saline  Hypothyroidism: TSH 0.029 on 12/09/08 -Continue Armour -check TSH  HTN: Not on medications at home. blood pressure is 153/88 on admission -When necessary hydralazine  Insomnia: -Continue home Melatonin -When necessary Ambien  Chronic back pain: -When necessary Tylenol and Percocet   DVT ppx: SQ Heparin    Code Status: Full code Family Communication:   Yes, patient's   Two friends from church    at bed side Disposition Plan: Admit to inpatient   Date of Service 07/30/2014    Ivor Costa [facility] Pager [phone]  If 7PM-7AM, please contact night-coverage [email or web address] Password [facility] 07/30/2014, 8:28 PM ] \

## 2014-07-30 NOTE — ED Provider Notes (Signed)
CSN: [id number]     Arrival date & time 07/30/14  1544 History   First MD Initiated Contact with Patient 07/30/14 [phone]     Chief Complaint  Patient presents with  . Nausea  . Insomnia     (Consider location/radiation/quality/duration/timing/severity/associated sxs/prior Treatment) HPI Patricia Beck is a 74 year old female with past medical history of hypertension, chronic back pain who presents the ER complaining of nausea, and feeling of dehydration. Patient states that she began a course of prednisone 6 days ago, and has since felt persistent nausea. Patient reports emesis nonbilious, nonbloody times one today. Patient states she has been able to keep food and fluids down, however has not had much of an appetite. Patient also reports exacerbation of her insomnia since being on the prednisone, as well as exacerbation of her anxiety. Patient denies fever, chest pain, shortness of breath, dizziness, weakness, headache, blurred vision, abdominal pain, dysuria.  Past Medical History  Diagnosis Date  . Fibroma 2006  . Hypertension   . Thyroid disease   . Cataract    Past Surgical History  Procedure Laterality Date  . Fibroma    . Eye surgery     Family History  Problem Relation Age of Onset  . Diabetes Father    History  Substance Use Topics  . Smoking status: Never Smoker   . Smokeless tobacco: Not on file  . Alcohol Use: No   OB History    No data available     Review of Systems  Constitutional: Negative for fever.  HENT: Negative for trouble swallowing.   Eyes: Negative for visual disturbance.  Respiratory: Negative for shortness of breath.   Cardiovascular: Negative for chest pain.  Gastrointestinal: Positive for nausea and vomiting. Negative for abdominal pain.  Genitourinary: Negative for dysuria.  Musculoskeletal: Negative for neck pain.  Skin: Negative for rash.  Neurological: Negative for dizziness, weakness and numbness.  Psychiatric/Behavioral: The patient  is nervous/anxious.       Allergies  Aciphex; Aleve; Atarax; Codeine; Erythromycin; Keflex; Lidocaine; and Prednisone  Home Medications   Prior to Admission medications   Medication Sig Start Date End Date Taking? Authorizing Provider  acetaminophen (TYLENOL) 500 MG tablet Take 1,000 mg by mouth every 6 (six) hours as needed for moderate pain (pain).    Yes Historical Provider, MD  Ascorbic Acid (VITAMIN C) 1000 MG tablet Take 1,000 mg by mouth daily.   Yes Historical Provider, MD  calcium citrate (CALCITRATE - DOSED IN MG ELEMENTAL CALCIUM) 950 MG tablet Take 200 mg of elemental calcium by mouth daily.   Yes Historical Provider, MD  cholecalciferol (VITAMIN D) 1000 UNITS tablet Take 5,000 Units by mouth daily.   Yes Historical Provider, MD  estradiol (ESTRACE) 0.5 MG tablet Take 0.5 mg by mouth at bedtime.    Yes Historical Provider, MD  Melatonin 3 MG TABS Take 15-18 mg by mouth at bedtime.   Yes Historical Provider, MD  predniSONE (DELTASONE) 5 MG tablet Take 5 mg by mouth daily with breakfast.   Yes Historical Provider, MD  thyroid (ARMOUR) 60 MG tablet Take 60 mg by mouth daily before breakfast.   Yes Historical Provider, MD  vitamin E 400 UNIT capsule Take 400 Units by mouth daily.   Yes Historical Provider, MD  celecoxib (CELEBREX) 200 MG capsule Take 1 capsule (200 mg total) by mouth 2 (two) times daily. Patient not taking: Reported on 07/30/2014 07/02/14   Roselee Culver, MD  meloxicam (MOBIC) 15 MG tablet Take 1 tablet (  15 mg total) by mouth daily. Patient not taking: Reported on 07/30/2014 06/30/14   Roselee Culver, MD  traMADol (ULTRAM) 50 MG tablet Take 1 tablet (50 mg total) by mouth every 8 (eight) hours as needed. Patient not taking: Reported on 07/30/2014 06/30/14   Roselee Culver, MD   BP 146/80 mmHg  Pulse 79  Temp(Src) 97.6 F (36.4 C) (Oral)  Resp 19  Ht 5\' 2"  (1.575 m)  Wt 128 lb 4.9 oz (58.2 kg)  BMI 23.46 kg/m2  SpO2 100% Physical Exam   Constitutional: She is oriented to person, place, and time. She appears well-developed and well-nourished. No distress.  HENT:  Head: Normocephalic and atraumatic.  Mouth/Throat: Oropharynx is clear and moist. No oropharyngeal exudate.  Eyes: Right eye exhibits no discharge. Left eye exhibits no discharge. No scleral icterus.  Neck: Normal range of motion and full passive range of motion without pain. Neck supple. No spinous process tenderness and no muscular tenderness present. No rigidity. No edema and no erythema present. No Brudzinski's sign and no Kernig's sign noted.  Cardiovascular: Normal rate, regular rhythm, S1 normal, S2 normal and normal heart sounds.   No murmur heard. Pulmonary/Chest: Effort normal and breath sounds normal. No respiratory distress.  Abdominal: Soft. Normal appearance and bowel sounds are normal. There is no tenderness. There is no rigidity, no guarding, no tenderness at McBurney's point and negative Murphy's sign.  Musculoskeletal: Normal range of motion. She exhibits no edema or tenderness.  Neurological: She is alert and oriented to person, place, and time. She has normal strength. No cranial nerve deficit or sensory deficit. Coordination normal. GCS eye subscore is 4. GCS verbal subscore is 5. GCS motor subscore is 6.  Patient fully alert, answering questions appropriately in full, clear sentences. Cranial nerves II through XII grossly intact. Motor strength 5 out of 5 in all major muscle groups of upper and lower extremities. Distal sensation intact.   Skin: Skin is warm and dry. No rash noted. She is not diaphoretic.  Psychiatric: She has a normal mood and affect.  Nursing note and vitals reviewed.   ED Course  Procedures (including critical care time) Labs Review Labs Reviewed  CBC WITH DIFFERENTIAL/PLATELET - Abnormal; Notable for the following:    WBC 16.6 (*)    MCHC 36.6 (*)    Neutrophils Relative % 86 (*)    Neutro Abs 14.2 (*)    Lymphocytes  Relative 6 (*)    Monocytes Absolute 1.3 (*)    All other components within normal limits  COMPREHENSIVE METABOLIC PANEL - Abnormal; Notable for the following:    Sodium 111 (*)    Potassium 2.6 (*)    Chloride 75 (*)    Glucose, Bld 127 (*)    Creatinine, Ser 0.41 (*)    Calcium 7.6 (*)    All other components within normal limits  URINALYSIS, ROUTINE W REFLEX MICROSCOPIC - Abnormal; Notable for the following:    APPearance CLOUDY (*)    Ketones, ur 15 (*)    Leukocytes, UA SMALL (*)    All other components within normal limits  BASIC METABOLIC PANEL - Abnormal; Notable for the following:    Sodium 118 (*)    Potassium 2.8 (*)    Chloride 83 (*)    Glucose, Bld 125 (*)    Creatinine, Ser 0.47 (*)    Calcium 7.0 (*)    All other components within normal limits  URINE CULTURE  CULTURE, BLOOD (ROUTINE X 2)  CULTURE, BLOOD (ROUTINE X 2)  LIPASE, BLOOD  MAGNESIUM  URINE MICROSCOPIC-ADD ON  TSH  PROTIME-INR  OSMOLALITY, URINE  OSMOLALITY  CREATININE, URINE, RANDOM  BASIC METABOLIC PANEL  CORTISOL-AM, BLOOD  BASIC METABOLIC PANEL  I-STAT TROPOININ, ED    Imaging Review No results found.   EKG Interpretation   Date/Time:  Thursday July 30 2014 17:24:52 EDT Ventricular Rate:  81 PR Interval:  166 QRS Duration: 150 QT Interval:  459 QTC Calculation: 533 R Axis:   65 Text Interpretation:  Sinus rhythm RAE, consider biatrial enlargement  Right bundle branch block Baseline wander in lead(s) V4 V5 Confirmed by  Hazle Coca [phone]) on 07/30/2014 5:33:21 PM      MDM   Final diagnoses:  Hyponatremia    Patient's signs and symptoms are consistent with side effects of prednisone, although patient is mildly dry appearing on exam. Will follow-up with labs, treat symptomatically. Abdominal exam benign. Neuro exam benign.  Labs remarkable for a hyponatremia at 111, as well as hypokalemia at 2.6. Patient also describing some muscle spasms. Mild leukocytosis of 16, this  thought to be due to use of prednisone. Patient given IV potassium, as well as sodium begun. Labs otherwise unremarkable for acute pathology. Spoke with hospitalist to admit patient for her hyponatremia. The patient appears reasonably stabilized for admission considering the current resources, flow, and capabilities available in the ED at this time, and I doubt any other [facility] requiring further screening and/or treatment in the ED prior to admission.  BP 146/80 mmHg  Pulse 79  Temp(Src) 97.6 F (36.4 C) (Oral)  Resp 19  Ht 5\' 2"  (1.575 m)  Wt 128 lb 4.9 oz (58.2 kg)  BMI 23.46 kg/m2  SpO2 100%  Signed,  Dahlia Bailiff, PA-C 11:55 PM  Patient seen and discussed with Dr. Quintella Reichert, MD   Dahlia Bailiff, PA-C 07/30/14 [place], MD 07/31/14 [phone]

## 2014-07-31 LAB — BASIC METABOLIC PANEL
ANION GAP: 7 (ref 5–15)
Anion gap: 7 (ref 5–15)
BUN: 11 mg/dL (ref 6–23)
BUN: 8 mg/dL (ref 6–23)
CALCIUM: 7.4 mg/dL — AB (ref 8.4–10.5)
CHLORIDE: 91 mmol/L — AB (ref 96–112)
CO2: 23 mmol/L (ref 19–32)
CO2: 22 mmol/L (ref 19–32)
Calcium: 6.9 mg/dL — ABNORMAL LOW (ref 8.4–10.5)
Chloride: 90 mmol/L — ABNORMAL LOW (ref 96–112)
Creatinine, Ser: 0.49 mg/dL — ABNORMAL LOW (ref 0.50–1.10)
Creatinine, Ser: 0.65 mg/dL (ref 0.50–1.10)
GFR calc Af Amer: >90 mL/min (ref >90)
GFR calc Af Amer: >90 mL/min (ref >90)
GFR calc non Af Amer: 86 mL/min — ABNORMAL LOW (ref >90)
GFR calc non Af Amer: >90 mL/min (ref >90)
GLUCOSE: 107 mg/dL — AB (ref 70–99)
Glucose, Bld: 115 mg/dL — ABNORMAL HIGH (ref 70–99)
Potassium: 3.4 mmol/L — ABNORMAL LOW (ref 3.5–5.1)
Potassium: 3.6 mmol/L (ref 3.5–5.1)
SODIUM: 120 mmol/L — AB (ref 135–145)
Sodium: 120 mmol/L — ABNORMAL LOW (ref 135–145)

## 2014-07-31 LAB — OSMOLALITY: OSMOLALITY: 241 mosm/kg — AB (ref 275–300)

## 2014-07-31 LAB — OSMOLALITY, URINE: Osmolality, Ur: 220 mOsm/kg — ABNORMAL LOW (ref 390–1090)

## 2014-07-31 LAB — CREATININE, URINE, RANDOM: Creatinine, Urine: 60.6 mg/dL

## 2014-07-31 MED ORDER — LEVOFLOXACIN IN D5W 500 MG/100ML IV SOLN
500.0000 mg | INTRAVENOUS | Status: DC
  Administered 2014-07-31: 500 mg via INTRAVENOUS
  Filled 2014-07-31 (×2): qty 100

## 2014-07-31 MED ORDER — POTASSIUM CHLORIDE CRYS ER 20 MEQ PO TBCR
40.0000 meq | EXTENDED_RELEASE_TABLET | Freq: Once | ORAL | Status: AC
  Administered 2014-07-31: 40 meq via ORAL
  Filled 2014-07-31: qty 2

## 2014-07-31 MED ORDER — DEXTROSE 5 % IV SOLN
INTRAVENOUS | Status: DC
  Administered 2014-07-31: 01:00:00 via INTRAVENOUS

## 2014-07-31 NOTE — Evaluation (Signed)
Occupational Therapy Evaluation Patient Details Name: Patricia Beck MRN: [id number] DOB: Jul 02, 1940 Today's Date: 07/31/2014    History of Present Illness 74 y.o. female with a past medical history of hypertension, hypothyroidism, chronic back pain, cataract, who presents with nausea, vomiting, suprapubic abdominal pain and insomnia (had started taking prednisone approx 4 days prior for back pain)   Clinical Impression   Pt was admitted for the above.  All education was completed.  No further OT is needed at this time.    Follow Up Recommendations  No OT follow up    Equipment Recommendations  None recommended by OT    Recommendations for Other Services       Precautions / Restrictions Precautions Precautions: None Precaution Comments: reviewed back precautions as pt has chronic back pain Restrictions Weight Bearing Restrictions: No      Mobility Bed Mobility Overal bed mobility: Modified Independent                Transfers Overall transfer level: Modified independent                    Balance Overall balance assessment: No apparent balance deficits (not formally assessed)                                          ADL Overall ADL's : Needs assistance/impaired                                       General ADL Comments: pt is able to perform ADLs with set up.  She tends to log roll and crosses legs for LB adls.  Educated on reacher for retrieving items and reviewed back precautions to minimize stress on back.  Handout given. Pt verrbalizes understanding of all. She does not quite feel at baseline, but feels close.  Patricia Beck is assisting with getting food and pt is agreeable to sponge bathing for a couple of days as needed while she gets energy back.  Reviewed reacher's uses for ADLs also, as needed.     Vision     Perception     Praxis      Pertinent Vitals/Pain Pain Assessment: No/denies pain     Hand  Dominance     Extremity/Trunk Assessment Upper Extremity Assessment Upper Extremity Assessment: Overall WFL for tasks assessed          Communication Communication Communication: No difficulties   Cognition Arousal/Alertness: Awake/alert Behavior During Therapy: WFL for tasks assessed/performed Overall Cognitive Status: Within Functional Limits for tasks assessed                     General Comments       Exercises       Shoulder Instructions      Home Living Family/patient expects to be discharged to:: Private residence Living Arrangements: Alone   Type of Home: Apartment Home Access: Level entry     Home Layout: One level     Bathroom Shower/Tub: Tub/shower unit Shower/tub characteristics: Architectural technologist: Standard     Home Equipment: Grab bars - toilet;Grab bars - tub/shower          Prior Functioning/Environment Level of Independence: Independent             OT Diagnosis: Acute pain;Generalized weakness  OT Problem List:     OT Treatment/Interventions:      OT Goals(Current goals can be found in the care plan section) Acute Rehab OT Goals Patient Stated Goal: get strength back: she feels like she will in a couple of  days  OT Frequency:     Barriers to D/C:            Co-evaluation              End of Session    Activity Tolerance: Patient tolerated treatment well Patient left: in chair;with call bell/phone within reach   Time: 1325-1347 OT Time Calculation (min): 22 min Charges:  OT General Charges $OT Visit: 1 Procedure OT Evaluation $Initial OT Evaluation Tier I: 1 Procedure G-Codes:    Patricia Beck 08/21/2014, 1:59 PM Patricia Beck, OTR/L [phone] 2014-08-21

## 2014-07-31 NOTE — Evaluation (Signed)
Physical Therapy One Time Evaluation Patient Details Name: Patricia Beck MRN: [id number] DOB: 07-15-1940 Today's Date: 07/31/2014   History of Present Illness  74 y.o. female with a past medical history of hypertension, hypothyroidism, chronic back pain, cataract, who presents with nausea, vomiting, suprapubic abdominal pain and insomnia (had started taking prednisone approx 4 days prior for back pain)  Clinical Impression  Patient evaluated by Physical Therapy with no further acute PT needs identified. All education has been completed and the patient has no further questions. Pt educated on safe use of RW for ambulation and recommended RW for home when back pain becomes worse.  Pt reports seeing PT for back pain many years ago so recommended outpatient PT f/u to assist with chronic back pain. Pt agreeable to ambulate with nursing staff during acute stay. PT is signing off. Thank you for this referral.     Follow Up Recommendations Outpatient PT    Equipment Recommendations  Rolling walker with 5" wheels    Recommendations for Other Services       Precautions / Restrictions Precautions Precautions: None      Mobility  Bed Mobility Overal bed mobility: Modified Independent                Transfers Overall transfer level: Modified independent                  Ambulation/Gait Ambulation/Gait assistance: Supervision Ambulation Distance (Feet): 400 Feet Assistive device: Rolling walker (2 wheeled) Gait Pattern/deviations: Step-through pattern;Trunk flexed     General Gait Details: pt reported needing something to steady herself for gait today so educated on using RW for safety as well as pain control if/when having back pain, cues for RW distance and posture  Stairs            Wheelchair Mobility    Modified Rankin (Stroke Patients Only)       Balance Overall balance assessment: No apparent balance deficits (not formally assessed)                                            Pertinent Vitals/Pain Pain Assessment: No/denies pain    Home Living Family/patient expects to be discharged to:: Private residence Living Arrangements: Alone   Type of Home: Apartment Home Access: Level entry     Home Layout: One level Home Equipment: None      Prior Function Level of Independence: Independent               Hand Dominance        Extremity/Trunk Assessment               Lower Extremity Assessment: Generalized weakness         Communication   Communication: No difficulties  Cognition Arousal/Alertness: Awake/alert Behavior During Therapy: WFL for tasks assessed/performed Overall Cognitive Status: Within Functional Limits for tasks assessed                      General Comments      Exercises        Assessment/Plan    PT Assessment All further PT needs can be met in the next venue of care  PT Diagnosis Other (comment) (chronic back pain)   PT Problem List Pain  PT Treatment Interventions     PT Goals (Current goals can be found in the  Care Plan section) Acute Rehab PT Goals PT Goal Formulation: All assessment and education complete, DC therapy    Frequency     Barriers to discharge        Co-evaluation               End of Session Equipment Utilized During Treatment: Gait belt Activity Tolerance: Patient tolerated treatment well Patient left: Other (comment) (in bathroom, aware to pull cord for assist back to bed) Nurse Communication:  (RN aware pt in bathroom and to pull cord when ready)         Time: 0949-1002 PT Time Calculation (min) (ACUTE ONLY): 13 min   Charges:   PT Evaluation $Initial PT Evaluation Tier I: 1 Procedure     PT G Codes:        LEMYRE,KATHrine E 07/31/2014, 12:09 PM Kati Lemyre, PT, DPT 07/31/2014 Pager: [phone]   

## 2014-07-31 NOTE — Progress Notes (Addendum)
Patient Demographics  Patricia Beck, is a 74 y.o. female, DOB - 21-Apr-1940, BBC:[id number]  Admit date - 07/30/2014   Admitting Physician Ivor Costa, MD  Outpatient Primary MD for the patient is Pcp Not In System  LOS - 1   Chief Complaint  Patient presents with  . Nausea  . Insomnia       admission history of present illness/brief narrative:  74 year old female with past medical history of hypertension, hypothyroidism, chronic back pain, cataract, who presents with nausea, vomiting, suprapubic abdominal pain and insomnia.  recently started on prednisone for lower back pain,  Reports she developednausea, vomiting, hot flush. So she stop taking it,But she continues to have nausea and vomiting. She reports that she has increased urinary frequency, which she attributs to having drinken a lot of fluid. No dysuria or burning on urination.  Workup was significant for mild UTI.  Subjective:   Patricia Beck today has, No headache, No chest pain, No abdominal pain - No Nausea, No new weakness tingling or numbness, No  Cough or shortness of breath.  Assessment & Plan    Principal Problem:   Nausea & vomiting Active Problems:   Hypertension   Hypothyroidism   Hyponatremia   Hypokalemia   Hypochloremia   UTI (lower urinary tract infection)   Chronic back pain   Insomnia   nausea and vomiting -  This is most likely in the setting of her taking prednisone,  And having UTI. -  Continue with nausea medication as needed,  No recurrence since admission.   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 -  Keep on fluid restriction -  Follow cortisol level, -  TSH within normal limits   leukocytosis -  Related to UTI versus steroids.   hypothyroidism -  TSH within normal limits, continue with Armour  Thyroid   hypokalemia -  Likely related to nausea and vomiting, repleted   Hypertension - not taking any home medication, monitor on when necessary hydralazine.   chronic back pain -  When necessary pain medication.  Code Status: full  Family Communication: none at bedside  Disposition Plan: home when stable.   Procedures  none   Consults   none   Medications  Scheduled Meds: . aztreonam  1 g Intravenous Q12H  . calcium citrate  200 mg of elemental calcium Oral Daily  . cholecalciferol  5,000 Units Oral Daily  . estradiol  0.5 mg Oral QHS  . heparin  5,000 Units Subcutaneous 3 times per day  . potassium chloride  40 mEq Oral Once  . sodium chloride  3 mL Intravenous Q12H  . thyroid  60 mg Oral QAC breakfast  . vitamin C  1,000 mg Oral Daily  . vitamin E  400 Units Oral Daily   Continuous Infusions:  PRN Meds:.acetaminophen, alum & mag hydroxide-simeth, hydrALAZINE, HYDROmorphone (  DILAUDID) injection, oxyCODONE-acetaminophen, promethazine, zolpidem  DVT Prophylaxis  - Heparin -  Lab Results  Component Value Date   PLT 287 07/30/2014    Antibiotics   Anti-infectives    Start     Dose/Rate Route Frequency Ordered Stop   07/30/14 2200  aztreonam (AZACTAM) injection 1 g  Status:  Discontinued     1 g Intramuscular Every 12 hours 07/30/14 1949 07/30/14 1950   07/30/14 2200  aztreonam (AZACTAM) 1 g in dextrose 5 % 50 mL IVPB     1 g 100 mL/hr over 30 Minutes Intravenous Every 12 hours 07/30/14 1951            Objective:   Filed Vitals:   07/30/14 1930 07/30/14 1945 07/30/14 2050 07/31/14 0417  BP: 160/78 170/89 146/80 149/76  Pulse: 74 78 79 76  Temp:   97.6 F (36.4 C) 98 F (36.7  C)  TempSrc:   Oral Oral  Resp: 14 18 19 18   Height:   5\' 2"  (1.575 m)   Weight:   58.2 kg (128 lb 4.9 oz)   SpO2:   100% 97%    Wt Readings from Last 3 Encounters:  07/30/14 58.2 kg (128 lb 4.9 oz)  06/30/14 58.06 kg (128 lb)     Intake/Output Summary (Last 24 hours) at 07/31/14 1408 Last data filed at 07/31/14 1150  Gross per 24 hour  Intake 2208.33 ml  Output   2375 ml  Net -166.67 ml     Physical Exam  Awake Alert, Oriented X 3, No new F.N deficits, Normal affect [place].AT,PERRAL Supple Neck,No JVD, No cervical lymphadenopathy appriciated.  Symmetrical Chest wall movement, Good air movement bilaterally, CTAB RRR,No Gallops,Rubs or new Murmurs, No Parasternal Heave +ve B.Sounds, Abd Soft, No tenderness, No organomegaly appriciated, No rebound - guarding or rigidity. No Cyanosis, Clubbing or edema, No new Rash or bruise     Data Review   Micro Results No results found for this or any previous visit (from the past 240 hour(s)).  Radiology Reports No results found.  CBC  Recent Labs Lab 07/30/14 1646  WBC 16.6*  HGB 14.4  HCT 39.3  PLT 287  MCV 85.1  MCH 31.2  MCHC 36.6*  RDW 12.6  LYMPHSABS 1.1  MONOABS 1.3*  EOSABS 0.0  BASOSABS 0.0    Chemistries   Recent Labs Lab 07/30/14 1646 07/30/14 1831 07/30/14 2100 07/30/14 2340 07/31/14 0547  NA 111*  --  118* 120* 120*  K 2.6*  --  2.8* 3.4* 3.6  CL 75*  --  83* 91* 90*  CO2 22  --  24 22 23   GLUCOSE 127*  --  125* 115* 107*  BUN 11  --  9 8 11   CREATININE 0.41*  --  0.47* 0.49* 0.65  CALCIUM 7.6*  --  7.0* 6.9* 7.4*  MG  --  2.0  --   --   --   AST 36  --   --   --   --   ALT 21  --   --   --   --   ALKPHOS 55  --   --   --   --   BILITOT 1.0  --   --   --   --    ------------------------------------------------------------------------------------------------------------------ estimated creatinine clearance is 49.5 mL/min (by C-G formula based on Cr of  0.65). ------------------------------------------------------------------------------------------------------------------ No results for input(s): HGBA1C in the last 72 hours. ------------------------------------------------------------------------------------------------------------------ No results for input(s): CHOL, HDL, LDLCALC,  TRIG, CHOLHDL, LDLDIRECT in the last 72 hours. ------------------------------------------------------------------------------------------------------------------  Recent Labs  07/30/14 2130  TSH 1.341   ------------------------------------------------------------------------------------------------------------------ No results for input(s): VITAMINB12, FOLATE, FERRITIN, TIBC, IRON, RETICCTPCT in the last 72 hours.  Coagulation profile  Recent Labs Lab 07/30/14 2130  INR 1.05    No results for input(s): DDIMER in the last 72 hours.  Cardiac Enzymes No results for input(s): CKMB, TROPONINI, MYOGLOBIN in the last 168 hours.  Invalid input(s): CK ------------------------------------------------------------------------------------------------------------------ Invalid input(s): POCBNP     Time Spent in minutes    35 minutes   ELGERGAWY, DAWOOD M.D on 07/31/2014 at 2:08 PM  Between 7am to 7pm - Pager - [phone]  After 7pm go to [email or web address] - password [email or web address]  And look for the night coverage person covering for me after hours  [facility] Office  [phone]   **Disclaimer: This note may have been dictated with voice recognition software. Similar sounding words can inadvertently be transcribed and this note may contain transcription errors which may not have been corrected upon publication of note.**

## 2014-08-01 LAB — BASIC METABOLIC PANEL
ANION GAP: 4 — AB (ref 5–15)
BUN: 16 mg/dL (ref 6–23)
CO2: 25 mmol/L (ref 19–32)
CREATININE: 0.84 mg/dL (ref 0.50–1.10)
Calcium: 8.1 mg/dL — ABNORMAL LOW (ref 8.4–10.5)
Chloride: 99 mmol/L (ref 96–112)
GFR calc non Af Amer: 67 mL/min — ABNORMAL LOW (ref >90)
GFR, EST AFRICAN AMERICAN: 78 mL/min — AB (ref >90)
Glucose, Bld: 100 mg/dL — ABNORMAL HIGH (ref 70–99)
Potassium: 4.1 mmol/L (ref 3.5–5.1)
Sodium: 128 mmol/L — ABNORMAL LOW (ref 135–145)

## 2014-08-01 LAB — CBC
HCT: 35.9 % — ABNORMAL LOW (ref 36.0–46.0)
Hemoglobin: 12.2 g/dL (ref 12.0–15.0)
MCH: 30.6 pg (ref 26.0–34.0)
MCHC: 34 g/dL (ref 30.0–36.0)
MCV: 90 fL (ref 78.0–100.0)
Platelets: 240 10*3/uL (ref 150–400)
RBC: 3.99 MIL/uL (ref 3.87–5.11)
RDW: 13.9 % (ref 11.5–15.5)
WBC: 9.2 10*3/uL (ref 4.0–10.5)

## 2014-08-01 LAB — CORTISOL-AM, BLOOD: CORTISOL - AM: 27.1 ug/dL — AB (ref 4.3–22.4)

## 2014-08-01 LAB — URINE CULTURE
CULTURE: NO GROWTH
Colony Count: NO GROWTH

## 2014-08-01 MED ORDER — LEVOFLOXACIN 500 MG PO TABS
500.0000 mg | ORAL_TABLET | Freq: Every day | ORAL | Status: DC
Start: 2014-08-01 — End: 2016-01-01

## 2014-08-01 MED ORDER — HYDROCODONE-ACETAMINOPHEN 5-325 MG PO TABS: 1.0000 | ORAL_TABLET | ORAL | Status: DC | PRN

## 2014-08-01 MED ORDER — ZOLPIDEM TARTRATE 5 MG PO TABS: 5.0000 mg | ORAL_TABLET | Freq: Every evening | ORAL | Status: DC | PRN

## 2014-08-01 NOTE — Discharge Instructions (Signed)
Follow with Primary MD Pcp Not In System in 7 days   Get CBC, CMP, 2 view Chest X ray checked  by Primary MD next visit.    Activity: As tolerated with Full fall precautions use walker/cane & assistance as needed   Disposition Home    Diet: Heart Healthy  , with feeding assistance and aspiration precautions.  For Heart failure patients - Check your Weight same time everyday, if you gain over 2 pounds, or you develop in leg swelling, experience more shortness of breath or chest pain, call your Primary MD immediately. Follow Cardiac Low Salt Diet and 1.5 lit/day fluid restriction.   On your next visit with your primary care physician please Get Medicines reviewed and adjusted.   Please request your Prim.MD to go over all Hospital Tests and Procedure/Radiological results at the follow up, please get all Hospital records sent to your Prim MD by signing hospital release before you go home.   If you experience worsening of your admission symptoms, develop shortness of breath, life threatening emergency, suicidal or homicidal thoughts you must seek medical attention immediately by calling 911 or calling your MD immediately  if symptoms less severe.  You Must read complete instructions/literature along with all the possible adverse reactions/side effects for all the Medicines you take and that have been prescribed to you. Take any new Medicines after you have completely understood and accpet all the possible adverse reactions/side effects.   Do not drive, operating heavy machinery, perform activities at heights, swimming or participation in water activities or provide baby sitting services if your were admitted for syncope or siezures until you have seen by Primary MD or a Neurologist and advised to do so again.  Do not drive when taking Pain medications.    Do not take more than prescribed Pain, Sleep and Anxiety Medications  Special Instructions: If you have smoked or chewed Tobacco  in  the last 2 yrs please stop smoking, stop any regular Alcohol  and or any Recreational drug use.  Wear Seat belts while driving.   Please note  You were cared for by a hospitalist during your hospital stay. If you have any questions about your discharge medications or the care you received while you were in the hospital after you are discharged, you can call the unit and asked to speak with the hospitalist on call if the hospitalist that took care of you is not available. Once you are discharged, your primary care physician will handle any further medical issues. Please note that NO REFILLS for any discharge medications will be authorized once you are discharged, as it is imperative that you return to your primary care physician (or establish a relationship with a primary care physician if you do not have one) for your aftercare needs so that they can reassess your need for medications and monitor your lab values.

## 2014-08-01 NOTE — Care Management Note (Signed)
    Page 1 of 1   08/01/2014     3:27:37 PM CARE MANAGEMENT NOTE 08/01/2014  Patient:  Patricia Beck, Patricia Beck   Account Number:  [id number]  Date Initiated:  07/31/2014  Documentation initiated by:  Karl Bales  Subjective/Objective Assessment:   pt admitted with cco Nausea, vomiting, suprapubic abdominal pain, insomnia     Action/Plan:   from home   Anticipated DC Date:  08/01/2014   Anticipated DC Plan:  [place]  [facility] - Pt will follow up      Choice offered to / List presented to:  C-1 Patient   DME arranged  Vassie Moselle      DME agency  [facility].        Status of service:  Completed, signed off Medicare Important Message given?   (If response is "NO", the following Medicare IM given date fields will be blank) Date Medicare IM given:   Medicare IM given by:   Date Additional Medicare IM given:   Additional Medicare IM given by:    Discharge Disposition:  HOME/SELF CARE  Per UR Regulation:  Reviewed for med. necessity/level of care/duration of stay  If discussed at [place] of Stay Meetings, dates discussed:    Comments:  08/01/14 Dessa Phi RN BSN NCM [phone] PT-otpt rehab.Provided patient w/otpt rehab resource list.[facility] dme liason aware to bring to rm.  07/31/14 MMcGibboney, RN, BSN Chart reviewed.

## 2014-08-01 NOTE — Discharge Summary (Signed)
Patricia Beck, 74 y.o., DOB 1940/11/30, MRN [id number]. Admission date: 07/30/2014 Discharge Date 08/01/2014 Primary MD Pcp Not In System Admitting Physician Ivor Costa, MD   PCP please follow on: - Check CBC, BMP, patient is recovering from hyponatremia, sodium on discharge 128.  Admission Diagnosis  Hyponatremia [E87.1] Essential hypertension [I10]  Discharge Diagnosis   Principal Problem:   Nausea & vomiting Active Problems:   Hypertension   Hypothyroidism   Hyponatremia   Hypokalemia   Hypochloremia   UTI (lower urinary tract infection)   Chronic back pain   Insomnia     Past Medical History  Diagnosis Date  . Fibroma 2006  . Hypertension   . Thyroid disease   . Cataract     Past Surgical History  Procedure Laterality Date  . Fibroma    . Eye surgery       Hospital Course See H&P, Labs, Consult and Test reports for all details in brief, patient was admitted for **  Principal Problem:   Nausea & vomiting Active Problems:   Hypertension   Hypothyroidism   Hyponatremia   Hypokalemia   Hypochloremia   UTI (lower urinary tract infection)   Chronic back pain   Insomnia  74 year old female with past medical history of hypertension, hypothyroidism, chronic back pain, cataract, who presents with nausea, vomiting, suprapubic abdominal pain and insomnia. recently started on prednisone for lower back pain, Reports she developednausea, vomiting, hot flush. So she stop taking it,But she continues to have nausea and vomiting. She reports that she has increased urinary frequency, which she attributs to having drinken a lot of fluid. No dysuria or burning on urination. Workup was significant for mild UTI.  nausea and vomiting - This is most likely in the setting of her taking prednisone, And having UTI. - No recurrence since admission.  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  hypothyroidism - TSH within normal limits, continue with Armour Thyroid  hypokalemia - Likely related to nausea and vomiting, repleted, potassium is 4.1 on discharge  Hypertension - not taking any home medication, monitor on when necessary hydralazine.  chronic back pain - We'll discharge on Vicodin, given prescription for 20 tablets  Consults   None  Significant Tests:  See full reports for all details    Mr Lumbar Spine Wo Contrast  07/10/2014   CLINICAL DATA:  74 year old female with chronic lumbar back pain radiating to both lower extremities. Right sciatic neuritis. Subsequent encounter.  EXAM: MRI LUMBAR SPINE WITHOUT CONTRAST  TECHNIQUE: Multiplanar, multisequence MR imaging of the lumbar spine was performed. No intravenous contrast was administered.  COMPARISON:  Lumbar MRI 01/03/2008. CT Abdomen and Pelvis 03/22/2004.  FINDINGS: Same numbering system as on the 2009 comparison. Moderate to severe levoconvex lumbar scoliosis, apex at L2-L3. Partially visible dextro convex thoracic scoliosis. Chronic grade 1 anterolisthesis at L4-L5 measuring 2-3 mm. Overall vertebral height and alignment appears not significantly changed since 2005. No marrow  edema or evidence of acute osseous abnormality.  Visualized lower thoracic spinal cord is normal with conus medularis at L1-L2.  Congenital left pelvic kidney. Stable visualized abdominal and pelvic viscera.  T11-T12:  Partially visible, no  stenosis.  T12-L1:  Mild facet hypertrophy, no stenosis.  L1-L2: Moderate chronic facet hypertrophy has progressed and is greater on the right. Trace left facet joint fluid. Right eccentric disc bulge. Mild right lateral recess stenosis. Moderate right L1 foraminal stenosis has increased.  L2-L3: Chronic disc space loss and right eccentric circumferential disc osteophyte complex. Chronic moderate facet hypertrophy appears stable. No spinal stenosis or definite lateral recess stenosis. Mild right L2 foraminal stenosis appears increased.  L3-L4: Chronic but progressed disc space loss. Chronic circumferential disc osteophyte complex and moderate facet hypertrophy. Previously-seen facet joint fluid has resolved. No significant spinal stenosis. Mild right lateral recess and moderate right L3 foraminal stenosis appears stable.  L4-L5: Chronic anterolisthesis and severe facet degeneration. Chronic disc space loss and circumferential disc osteophyte complex. Stable lateral recesses without definite stenosis. Borderline to mild spinal stenosis is stable. No foraminal stenosis.  L5-S1: Interval disc desiccation and disc space loss. Circumferential disc osteophyte complex has increased. Moderate facet hypertrophy also has progressed. Still, no spinal or lateral recess stenosis. There is new mild left L5 foraminal stenosis.  Visible sacrum intact.  IMPRESSION: 1. 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 2. Progressed disc and endplate, and/or posterior element degeneration at the L1-L2, L3-L4, and L5-S1. No spinal stenosis at those levels, but mild to moderate lateral recess and/or foraminal stenosis has increased as detailed above. 3. Chronic anterolisthesis at L4-L5 with severe facet arthropathy and stable borderline to mild spinal stenosis .   Electronically Signed   By: Genevie Ann M.D.   On: 07/10/2014 19:16     Today   Subjective:    [name] today has no headache,no chest abdominal pain,no new weakness tingling or numbness, feels much better wants to go home today.   Objective:   Blood pressure 124/58, pulse 79, temperature 97.9 F (36.6 C), temperature source Oral, resp. rate 20, height 5\' 2"  (1.575 m), weight 58.2 kg (128 lb 4.9 oz), SpO2 98 %.  Intake/Output Summary (Last 24 hours) at 08/01/14 1441 Last data filed at 08/01/14 1224  Gross per 24 hour  Intake   1320 ml  Output   2000 ml  Net   -680 ml    Exam Awake Alert, Oriented *3, No new F.N deficits, Normal affect Robinson.AT,PERRAL Supple Neck,No JVD, No cervical lymphadenopathy appriciated.  Symmetrical Chest wall movement, Good air movement bilaterally, CTAB RRR,No Gallops,Rubs or new Murmurs, No Parasternal Heave +ve B.Sounds, Abd Soft, Non tender, No organomegaly appriciated, No rebound -guarding or rigidity. No Cyanosis, Clubbing or edema, No new Rash or bruise   Cultures -  Results for orders placed or performed during the hospital encounter of 07/30/14  Culture, blood (routine x 2)     Status: None (Preliminary result)   Collection Time: 07/30/14  8:55 PM  Result Value Ref Range Status   Specimen Description BLOOD LEFT ARM  Final   Special Requests BOTTLES DRAWN AEROBIC AND ANAEROBIC 5CC  Final   Culture   Final           BLOOD CULTURE RECEIVED NO GROWTH TO DATE CULTURE WILL BE HELD FOR 5 DAYS BEFORE ISSUING A FINAL NEGATIVE REPORT Performed at [facility]    Report Status PENDING  Incomplete  Culture, blood (routine x 2)     Status: None (Preliminary result)   Collection Time: 07/30/14  9:20 PM  Result Value Ref Range Status   Specimen Description BLOOD LEFT HAND  Final   Special Requests BOTTLES DRAWN AEROBIC AND ANAEROBIC 5CC  Final   Culture   Final           BLOOD CULTURE RECEIVED NO GROWTH TO DATE CULTURE WILL BE HELD FOR 5 DAYS BEFORE ISSUING A FINAL NEGATIVE REPORT Performed at [facility]    Report Status  PENDING  Incomplete  Urine culture     Status: None   Collection Time: 07/31/14  3:51 AM  Result Value Ref Range Status   Specimen Description URINE, CLEAN CATCH  Final   Special Requests NONE  Final   Colony Count NO GROWTH Performed at [facility]   Final   Culture NO GROWTH Performed at [facility]   Final   Report Status 08/01/2014 FINAL  Final     CBC w Diff: Lab Results  Component Value Date   WBC 9.2 08/01/2014   HGB 12.2 08/01/2014   HCT 35.9* 08/01/2014   PLT 240 08/01/2014   LYMPHOPCT 6* 07/30/2014   MONOPCT 8 07/30/2014   EOSPCT 0 07/30/2014   BASOPCT 0 07/30/2014   CMP: Lab Results  Component Value Date   NA 128* 08/01/2014   K 4.1 08/01/2014   CL 99 08/01/2014   CO2 25 08/01/2014   BUN 16 08/01/2014   CREATININE 0.84 08/01/2014   PROT 7.0 07/30/2014   ALBUMIN 3.5 07/30/2014   BILITOT 1.0 07/30/2014   ALKPHOS 55 07/30/2014   AST 36 07/30/2014   ALT 21 07/30/2014  .  Micro Results Recent Results (from the past 240 hour(s))  Culture, blood (routine x 2)     Status: None (Preliminary result)   Collection Time: 07/30/14  8:55 PM  Result Value Ref Range Status   Specimen Description BLOOD LEFT ARM  Final   Special Requests BOTTLES DRAWN AEROBIC AND ANAEROBIC 5CC  Final   Culture   Final           BLOOD CULTURE RECEIVED NO GROWTH TO DATE CULTURE WILL BE HELD FOR 5 DAYS BEFORE ISSUING A FINAL NEGATIVE REPORT Performed at [facility]    Report Status PENDING  Incomplete  Culture, blood (routine x 2)     Status: None (Preliminary result)   Collection Time: 07/30/14  9:20 PM  Result Value Ref Range Status   Specimen Description BLOOD LEFT HAND  Final   Special Requests BOTTLES DRAWN AEROBIC AND ANAEROBIC 5CC  Final   Culture   Final           BLOOD CULTURE RECEIVED NO GROWTH TO DATE CULTURE WILL BE HELD FOR 5 DAYS BEFORE ISSUING A FINAL NEGATIVE REPORT Performed at [facility]    Report Status PENDING  Incomplete   Urine culture     Status: None   Collection Time: 07/31/14  3:51 AM  Result Value Ref Range Status   Specimen Description URINE, CLEAN CATCH  Final   Special Requests NONE  Final   Colony Count NO GROWTH Performed at [facility]   Final   Culture NO GROWTH Performed at [facility]   Final   Report Status 08/01/2014 FINAL  Final     Discharge Instructions      Follow-up Information    Follow up with Mingo Amber, MD. Schedule  an appointment as soon as possible for a visit in 1 week.   Specialty:  Surgery   Why:  Posthospitalization follow-up   Contact information:   [place] [address] [phone]       Discharge Medications     Medication List    STOP taking these medications        celecoxib 200 MG capsule  Commonly known as:  CELEBREX     meloxicam 15 MG tablet  Commonly known as:  MOBIC     predniSONE 5 MG tablet  Commonly known as:  DELTASONE      TAKE these medications        acetaminophen 500 MG tablet  Commonly known as:  TYLENOL  Take 1,000 mg by mouth every 6 (six) hours as needed for moderate pain (pain).     calcium citrate 950 MG tablet  Commonly known as:  CALCITRATE - dosed in mg elemental calcium  Take 200 mg of elemental calcium by mouth daily.     cholecalciferol 1000 UNITS tablet  Commonly known as:  VITAMIN D  Take 5,000 Units by mouth daily.     estradiol 0.5 MG tablet  Commonly known as:  ESTRACE  Take 0.5 mg by mouth at bedtime.     HYDROcodone-acetaminophen 5-325 MG per tablet  Commonly known as:  NORCO/VICODIN  Take 1 tablet by mouth every 4 (four) hours as needed for moderate pain.     levofloxacin 500 MG tablet  Commonly known as:  LEVAQUIN  Take 1 tablet (500 mg total) by mouth daily.     Melatonin 3 MG Tabs  Take 15-18 mg by mouth at bedtime.     thyroid 60 MG tablet  Commonly known as:  ARMOUR  Take 60 mg by mouth daily before breakfast.     traMADol 50 MG tablet   Commonly known as:  ULTRAM  Take 1 tablet (50 mg total) by mouth every 8 (eight) hours as needed.     vitamin C 1000 MG tablet  Take 1,000 mg by mouth daily.     vitamin E 400 UNIT capsule  Take 400 Units by mouth daily.     zolpidem 5 MG tablet  Commonly known as:  AMBIEN  Take 1 tablet (5 mg total) by mouth at bedtime as needed for sleep.         Total Time in preparing paper work, data evaluation and todays exam - 35 minutes  Dillan Lunden M.D on 08/01/2014 at 2:41 PM  [place]  [phone]

## 2014-08-03 DIAGNOSIS — N812 Incomplete uterovaginal prolapse: Secondary | ICD-10-CM | POA: Diagnosis not present

## 2014-08-03 DIAGNOSIS — N390 Urinary tract infection, site not specified: Secondary | ICD-10-CM | POA: Diagnosis not present

## 2014-08-03 DIAGNOSIS — N950 Postmenopausal bleeding: Secondary | ICD-10-CM | POA: Diagnosis not present

## 2014-08-05 DIAGNOSIS — N950 Postmenopausal bleeding: Secondary | ICD-10-CM | POA: Diagnosis not present

## 2014-08-05 DIAGNOSIS — N840 Polyp of corpus uteri: Secondary | ICD-10-CM | POA: Diagnosis not present

## 2014-08-06 LAB — CULTURE, BLOOD (ROUTINE X 2)
CULTURE: NO GROWTH
Culture: NO GROWTH

## 2014-08-11 DIAGNOSIS — F4323 Adjustment disorder with mixed anxiety and depressed mood: Secondary | ICD-10-CM | POA: Diagnosis not present

## 2014-08-14 DIAGNOSIS — N8189 Other female genital prolapse: Secondary | ICD-10-CM | POA: Diagnosis not present

## 2014-08-18 DIAGNOSIS — M545 Low back pain: Secondary | ICD-10-CM | POA: Diagnosis not present

## 2014-08-18 DIAGNOSIS — G4700 Insomnia, unspecified: Secondary | ICD-10-CM | POA: Diagnosis not present

## 2014-08-24 DIAGNOSIS — N8189 Other female genital prolapse: Secondary | ICD-10-CM | POA: Diagnosis not present

## 2014-08-25 DIAGNOSIS — M549 Dorsalgia, unspecified: Secondary | ICD-10-CM | POA: Diagnosis not present

## 2014-08-25 DIAGNOSIS — M47816 Spondylosis without myelopathy or radiculopathy, lumbar region: Secondary | ICD-10-CM | POA: Diagnosis not present

## 2014-08-25 DIAGNOSIS — M5136 Other intervertebral disc degeneration, lumbar region: Secondary | ICD-10-CM | POA: Diagnosis not present

## 2014-08-25 DIAGNOSIS — M419 Scoliosis, unspecified: Secondary | ICD-10-CM | POA: Diagnosis not present

## 2014-08-25 DIAGNOSIS — M4806 Spinal stenosis, lumbar region: Secondary | ICD-10-CM | POA: Diagnosis not present

## 2014-08-25 DIAGNOSIS — I10 Essential (primary) hypertension: Secondary | ICD-10-CM | POA: Diagnosis not present

## 2014-08-27 DIAGNOSIS — N8189 Other female genital prolapse: Secondary | ICD-10-CM | POA: Diagnosis not present

## 2014-09-01 DIAGNOSIS — M545 Low back pain: Secondary | ICD-10-CM | POA: Diagnosis not present

## 2014-09-01 DIAGNOSIS — G4700 Insomnia, unspecified: Secondary | ICD-10-CM | POA: Diagnosis not present

## 2014-09-01 DIAGNOSIS — N8189 Other female genital prolapse: Secondary | ICD-10-CM | POA: Diagnosis not present

## 2014-09-03 DIAGNOSIS — N812 Incomplete uterovaginal prolapse: Secondary | ICD-10-CM | POA: Diagnosis not present

## 2014-09-05 DIAGNOSIS — F4323 Adjustment disorder with mixed anxiety and depressed mood: Secondary | ICD-10-CM | POA: Diagnosis not present

## 2014-09-15 DIAGNOSIS — R5383 Other fatigue: Secondary | ICD-10-CM | POA: Diagnosis not present

## 2014-09-15 DIAGNOSIS — M545 Low back pain: Secondary | ICD-10-CM | POA: Diagnosis not present

## 2014-09-15 DIAGNOSIS — M150 Primary generalized (osteo)arthritis: Secondary | ICD-10-CM | POA: Diagnosis not present

## 2014-09-15 DIAGNOSIS — G4700 Insomnia, unspecified: Secondary | ICD-10-CM | POA: Diagnosis not present

## 2014-09-30 DIAGNOSIS — N812 Incomplete uterovaginal prolapse: Secondary | ICD-10-CM | POA: Diagnosis not present

## 2014-10-13 DIAGNOSIS — M419 Scoliosis, unspecified: Secondary | ICD-10-CM | POA: Diagnosis not present

## 2014-10-13 DIAGNOSIS — M47816 Spondylosis without myelopathy or radiculopathy, lumbar region: Secondary | ICD-10-CM | POA: Diagnosis not present

## 2014-10-13 DIAGNOSIS — M4806 Spinal stenosis, lumbar region: Secondary | ICD-10-CM | POA: Diagnosis not present

## 2014-10-16 DIAGNOSIS — M150 Primary generalized (osteo)arthritis: Secondary | ICD-10-CM | POA: Diagnosis not present

## 2014-10-16 DIAGNOSIS — Z0000 Encounter for general adult medical examination without abnormal findings: Secondary | ICD-10-CM | POA: Diagnosis not present

## 2014-10-16 DIAGNOSIS — G4700 Insomnia, unspecified: Secondary | ICD-10-CM | POA: Diagnosis not present

## 2014-10-16 DIAGNOSIS — I10 Essential (primary) hypertension: Secondary | ICD-10-CM | POA: Diagnosis not present

## 2014-10-16 DIAGNOSIS — R5383 Other fatigue: Secondary | ICD-10-CM | POA: Diagnosis not present

## 2014-10-16 DIAGNOSIS — M545 Low back pain: Secondary | ICD-10-CM | POA: Diagnosis not present

## 2014-10-20 DIAGNOSIS — M419 Scoliosis, unspecified: Secondary | ICD-10-CM | POA: Diagnosis not present

## 2014-10-20 DIAGNOSIS — M4806 Spinal stenosis, lumbar region: Secondary | ICD-10-CM | POA: Diagnosis not present

## 2014-10-20 DIAGNOSIS — M549 Dorsalgia, unspecified: Secondary | ICD-10-CM | POA: Diagnosis not present

## 2014-10-20 DIAGNOSIS — M47816 Spondylosis without myelopathy or radiculopathy, lumbar region: Secondary | ICD-10-CM | POA: Diagnosis not present

## 2014-10-23 DIAGNOSIS — E871 Hypo-osmolality and hyponatremia: Secondary | ICD-10-CM | POA: Diagnosis not present

## 2014-10-23 DIAGNOSIS — M150 Primary generalized (osteo)arthritis: Secondary | ICD-10-CM | POA: Diagnosis not present

## 2014-10-23 DIAGNOSIS — E039 Hypothyroidism, unspecified: Secondary | ICD-10-CM | POA: Diagnosis not present

## 2014-10-23 DIAGNOSIS — M545 Low back pain: Secondary | ICD-10-CM | POA: Diagnosis not present

## 2014-10-23 DIAGNOSIS — Z23 Encounter for immunization: Secondary | ICD-10-CM | POA: Diagnosis not present

## 2014-10-29 DIAGNOSIS — E871 Hypo-osmolality and hyponatremia: Secondary | ICD-10-CM | POA: Diagnosis not present

## 2014-10-29 DIAGNOSIS — I10 Essential (primary) hypertension: Secondary | ICD-10-CM | POA: Diagnosis not present

## 2014-10-30 DIAGNOSIS — E871 Hypo-osmolality and hyponatremia: Secondary | ICD-10-CM | POA: Diagnosis not present

## 2014-11-04 DIAGNOSIS — N841 Polyp of cervix uteri: Secondary | ICD-10-CM | POA: Diagnosis not present

## 2014-11-04 DIAGNOSIS — N950 Postmenopausal bleeding: Secondary | ICD-10-CM | POA: Diagnosis not present

## 2014-11-04 DIAGNOSIS — N760 Acute vaginitis: Secondary | ICD-10-CM | POA: Diagnosis not present

## 2014-11-06 DIAGNOSIS — N950 Postmenopausal bleeding: Secondary | ICD-10-CM | POA: Diagnosis not present

## 2014-11-06 DIAGNOSIS — N841 Polyp of cervix uteri: Secondary | ICD-10-CM | POA: Diagnosis not present

## 2014-11-17 DIAGNOSIS — E039 Hypothyroidism, unspecified: Secondary | ICD-10-CM | POA: Diagnosis not present

## 2014-11-17 DIAGNOSIS — E871 Hypo-osmolality and hyponatremia: Secondary | ICD-10-CM | POA: Diagnosis not present

## 2014-11-20 ENCOUNTER — Ambulatory Visit (INDEPENDENT_AMBULATORY_CARE_PROVIDER_SITE_OTHER): Payer: Medicare Other | Admitting: Podiatry

## 2014-11-20 ENCOUNTER — Encounter: Payer: Self-pay | Admitting: Podiatry

## 2014-11-20 VITALS — BP 181/87 | HR 71 | Resp 15

## 2014-11-20 DIAGNOSIS — B351 Tinea unguium: Secondary | ICD-10-CM | POA: Diagnosis not present

## 2014-11-20 DIAGNOSIS — M79606 Pain in leg, unspecified: Secondary | ICD-10-CM

## 2014-11-20 DIAGNOSIS — M79676 Pain in unspecified toe(s): Secondary | ICD-10-CM

## 2014-11-20 DIAGNOSIS — L600 Ingrowing nail: Secondary | ICD-10-CM | POA: Diagnosis not present

## 2014-11-20 NOTE — Progress Notes (Signed)
   Subjective:    Patient ID: Patricia Beck, female    DOB: 06-Jul-1940, 74 y.o.   MRN: [id number]  HPI Pt presents with reddend medial border of nails 5th left and 1st right, she states that they are painful at times   Review of Systems  All other systems reviewed and are negative.      Objective:   Physical Exam        Assessment & Plan:

## 2014-11-20 NOTE — Patient Instructions (Signed)

## 2014-11-23 DIAGNOSIS — M47816 Spondylosis without myelopathy or radiculopathy, lumbar region: Secondary | ICD-10-CM | POA: Diagnosis not present

## 2014-11-23 DIAGNOSIS — M549 Dorsalgia, unspecified: Secondary | ICD-10-CM | POA: Diagnosis not present

## 2014-11-23 DIAGNOSIS — M4806 Spinal stenosis, lumbar region: Secondary | ICD-10-CM | POA: Diagnosis not present

## 2014-11-23 NOTE — Progress Notes (Signed)
Subjective:     Patient ID: Patricia Beck, female   DOB: [birth year], 74 y.o.   MRN: [id number]  HPI patient presents stating I am still having pain in my nailbeds are not sure what causes it but at times it makes it hard to walk comfortably   Review of Systems  All other systems reviewed and are negative.      Objective:   Physical Exam  Constitutional: She is oriented to person, place, and time.  Cardiovascular: Intact distal pulses.   Musculoskeletal: Normal range of motion.  Neurological: She is oriented to person, place, and time.  Skin: Skin is warm and dry.  Nursing note and vitals reviewed.  neurovascular status found to be mildly diminished with muscle strength adequate and range of motion subtalar midtarsal joint within normal limits. 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 Reappoint to recheck

## 2014-12-04 DIAGNOSIS — L821 Other seborrheic keratosis: Secondary | ICD-10-CM | POA: Diagnosis not present

## 2014-12-04 DIAGNOSIS — D225 Melanocytic nevi of trunk: Secondary | ICD-10-CM | POA: Diagnosis not present

## 2014-12-04 DIAGNOSIS — L812 Freckles: Secondary | ICD-10-CM | POA: Diagnosis not present

## 2014-12-04 DIAGNOSIS — D224 Melanocytic nevi of scalp and neck: Secondary | ICD-10-CM | POA: Diagnosis not present

## 2014-12-04 DIAGNOSIS — B370 Candidal stomatitis: Secondary | ICD-10-CM | POA: Diagnosis not present

## 2014-12-04 DIAGNOSIS — D2272 Melanocytic nevi of left lower limb, including hip: Secondary | ICD-10-CM | POA: Diagnosis not present

## 2014-12-04 DIAGNOSIS — D1801 Hemangioma of skin and subcutaneous tissue: Secondary | ICD-10-CM | POA: Diagnosis not present

## 2014-12-04 DIAGNOSIS — D2262 Melanocytic nevi of left upper limb, including shoulder: Secondary | ICD-10-CM | POA: Diagnosis not present

## 2014-12-05 DIAGNOSIS — N813 Complete uterovaginal prolapse: Secondary | ICD-10-CM | POA: Diagnosis not present

## 2014-12-15 DIAGNOSIS — N8189 Other female genital prolapse: Secondary | ICD-10-CM | POA: Diagnosis not present

## 2014-12-15 DIAGNOSIS — N950 Postmenopausal bleeding: Secondary | ICD-10-CM | POA: Diagnosis not present

## 2014-12-28 DIAGNOSIS — R1084 Generalized abdominal pain: Secondary | ICD-10-CM | POA: Diagnosis not present

## 2014-12-28 DIAGNOSIS — R634 Abnormal weight loss: Secondary | ICD-10-CM | POA: Diagnosis not present

## 2014-12-28 DIAGNOSIS — I10 Essential (primary) hypertension: Secondary | ICD-10-CM | POA: Diagnosis not present

## 2014-12-28 DIAGNOSIS — R300 Dysuria: Secondary | ICD-10-CM | POA: Diagnosis not present

## 2015-01-04 DIAGNOSIS — F411 Generalized anxiety disorder: Secondary | ICD-10-CM | POA: Diagnosis not present

## 2015-01-04 DIAGNOSIS — E871 Hypo-osmolality and hyponatremia: Secondary | ICD-10-CM | POA: Diagnosis not present

## 2015-01-11 DIAGNOSIS — E039 Hypothyroidism, unspecified: Secondary | ICD-10-CM | POA: Diagnosis not present

## 2015-01-12 DIAGNOSIS — N8189 Other female genital prolapse: Secondary | ICD-10-CM | POA: Diagnosis not present

## 2015-01-13 DIAGNOSIS — R5383 Other fatigue: Secondary | ICD-10-CM | POA: Diagnosis not present

## 2015-01-13 DIAGNOSIS — N8189 Other female genital prolapse: Secondary | ICD-10-CM | POA: Diagnosis not present

## 2015-01-19 DIAGNOSIS — E039 Hypothyroidism, unspecified: Secondary | ICD-10-CM | POA: Diagnosis not present

## 2015-01-21 DIAGNOSIS — G4700 Insomnia, unspecified: Secondary | ICD-10-CM | POA: Diagnosis not present

## 2015-01-26 DIAGNOSIS — E871 Hypo-osmolality and hyponatremia: Secondary | ICD-10-CM | POA: Diagnosis not present

## 2015-01-26 DIAGNOSIS — E039 Hypothyroidism, unspecified: Secondary | ICD-10-CM | POA: Diagnosis not present

## 2015-01-26 DIAGNOSIS — I10 Essential (primary) hypertension: Secondary | ICD-10-CM | POA: Diagnosis not present

## 2015-01-26 DIAGNOSIS — M150 Primary generalized (osteo)arthritis: Secondary | ICD-10-CM | POA: Diagnosis not present

## 2015-02-04 DIAGNOSIS — G4700 Insomnia, unspecified: Secondary | ICD-10-CM | POA: Diagnosis not present

## 2015-02-22 DIAGNOSIS — N812 Incomplete uterovaginal prolapse: Secondary | ICD-10-CM | POA: Diagnosis not present

## 2015-03-22 DIAGNOSIS — M9903 Segmental and somatic dysfunction of lumbar region: Secondary | ICD-10-CM | POA: Diagnosis not present

## 2015-03-22 DIAGNOSIS — M5137 Other intervertebral disc degeneration, lumbosacral region: Secondary | ICD-10-CM | POA: Diagnosis not present

## 2015-03-22 DIAGNOSIS — M9901 Segmental and somatic dysfunction of cervical region: Secondary | ICD-10-CM | POA: Diagnosis not present

## 2015-03-22 DIAGNOSIS — M5417 Radiculopathy, lumbosacral region: Secondary | ICD-10-CM | POA: Diagnosis not present

## 2015-03-22 DIAGNOSIS — M6240 Contracture of muscle, unspecified site: Secondary | ICD-10-CM | POA: Diagnosis not present

## 2015-03-24 DIAGNOSIS — N760 Acute vaginitis: Secondary | ICD-10-CM | POA: Diagnosis not present

## 2015-03-24 DIAGNOSIS — N390 Urinary tract infection, site not specified: Secondary | ICD-10-CM | POA: Diagnosis not present

## 2015-03-24 DIAGNOSIS — N9089 Other specified noninflammatory disorders of vulva and perineum: Secondary | ICD-10-CM | POA: Diagnosis not present

## 2015-03-24 DIAGNOSIS — N812 Incomplete uterovaginal prolapse: Secondary | ICD-10-CM | POA: Diagnosis not present

## 2015-03-26 DIAGNOSIS — N952 Postmenopausal atrophic vaginitis: Secondary | ICD-10-CM | POA: Diagnosis not present

## 2015-03-26 DIAGNOSIS — N813 Complete uterovaginal prolapse: Secondary | ICD-10-CM | POA: Diagnosis not present

## 2015-03-29 DIAGNOSIS — N8189 Other female genital prolapse: Secondary | ICD-10-CM | POA: Diagnosis not present

## 2015-05-04 DIAGNOSIS — I10 Essential (primary) hypertension: Secondary | ICD-10-CM | POA: Diagnosis not present

## 2015-05-04 DIAGNOSIS — E039 Hypothyroidism, unspecified: Secondary | ICD-10-CM | POA: Diagnosis not present

## 2015-05-04 DIAGNOSIS — M150 Primary generalized (osteo)arthritis: Secondary | ICD-10-CM | POA: Diagnosis not present

## 2015-05-04 DIAGNOSIS — E871 Hypo-osmolality and hyponatremia: Secondary | ICD-10-CM | POA: Diagnosis not present

## 2015-05-06 DIAGNOSIS — N812 Incomplete uterovaginal prolapse: Secondary | ICD-10-CM | POA: Diagnosis not present

## 2015-05-06 DIAGNOSIS — N760 Acute vaginitis: Secondary | ICD-10-CM | POA: Diagnosis not present

## 2015-05-11 DIAGNOSIS — G4700 Insomnia, unspecified: Secondary | ICD-10-CM | POA: Diagnosis not present

## 2015-05-11 DIAGNOSIS — I10 Essential (primary) hypertension: Secondary | ICD-10-CM | POA: Diagnosis not present

## 2015-05-11 DIAGNOSIS — E039 Hypothyroidism, unspecified: Secondary | ICD-10-CM | POA: Diagnosis not present

## 2015-05-11 DIAGNOSIS — E871 Hypo-osmolality and hyponatremia: Secondary | ICD-10-CM | POA: Diagnosis not present

## 2015-06-03 DIAGNOSIS — N812 Incomplete uterovaginal prolapse: Secondary | ICD-10-CM | POA: Diagnosis not present

## 2015-06-03 DIAGNOSIS — Z01419 Encounter for gynecological examination (general) (routine) without abnormal findings: Secondary | ICD-10-CM | POA: Diagnosis not present

## 2015-06-03 DIAGNOSIS — M545 Low back pain: Secondary | ICD-10-CM | POA: Diagnosis not present

## 2015-06-03 DIAGNOSIS — N760 Acute vaginitis: Secondary | ICD-10-CM | POA: Diagnosis not present

## 2015-06-03 DIAGNOSIS — N958 Other specified menopausal and perimenopausal disorders: Secondary | ICD-10-CM | POA: Diagnosis not present

## 2015-06-03 DIAGNOSIS — Z1231 Encounter for screening mammogram for malignant neoplasm of breast: Secondary | ICD-10-CM | POA: Diagnosis not present

## 2015-06-24 DIAGNOSIS — N760 Acute vaginitis: Secondary | ICD-10-CM | POA: Diagnosis not present

## 2015-06-24 DIAGNOSIS — N812 Incomplete uterovaginal prolapse: Secondary | ICD-10-CM | POA: Diagnosis not present

## 2015-07-06 DIAGNOSIS — N760 Acute vaginitis: Secondary | ICD-10-CM | POA: Diagnosis not present

## 2015-07-06 DIAGNOSIS — N390 Urinary tract infection, site not specified: Secondary | ICD-10-CM | POA: Diagnosis not present

## 2015-07-09 DIAGNOSIS — G4700 Insomnia, unspecified: Secondary | ICD-10-CM | POA: Diagnosis not present

## 2015-07-09 DIAGNOSIS — E039 Hypothyroidism, unspecified: Secondary | ICD-10-CM | POA: Diagnosis not present

## 2015-07-09 DIAGNOSIS — F411 Generalized anxiety disorder: Secondary | ICD-10-CM | POA: Diagnosis not present

## 2015-07-19 DIAGNOSIS — M549 Dorsalgia, unspecified: Secondary | ICD-10-CM | POA: Diagnosis not present

## 2015-07-27 DIAGNOSIS — N390 Urinary tract infection, site not specified: Secondary | ICD-10-CM | POA: Diagnosis not present

## 2015-07-27 DIAGNOSIS — N8189 Other female genital prolapse: Secondary | ICD-10-CM | POA: Diagnosis not present

## 2015-07-27 DIAGNOSIS — B373 Candidiasis of vulva and vagina: Secondary | ICD-10-CM | POA: Diagnosis not present

## 2015-08-26 DIAGNOSIS — N760 Acute vaginitis: Secondary | ICD-10-CM | POA: Diagnosis not present

## 2015-08-26 DIAGNOSIS — N8189 Other female genital prolapse: Secondary | ICD-10-CM | POA: Diagnosis not present

## 2015-08-26 DIAGNOSIS — N390 Urinary tract infection, site not specified: Secondary | ICD-10-CM | POA: Diagnosis not present

## 2015-09-01 DIAGNOSIS — M549 Dorsalgia, unspecified: Secondary | ICD-10-CM | POA: Diagnosis not present

## 2015-09-01 DIAGNOSIS — M419 Scoliosis, unspecified: Secondary | ICD-10-CM | POA: Diagnosis not present

## 2015-09-23 DIAGNOSIS — F419 Anxiety disorder, unspecified: Secondary | ICD-10-CM | POA: Diagnosis not present

## 2015-09-23 DIAGNOSIS — N760 Acute vaginitis: Secondary | ICD-10-CM | POA: Diagnosis not present

## 2015-09-23 DIAGNOSIS — N8189 Other female genital prolapse: Secondary | ICD-10-CM | POA: Diagnosis not present

## 2015-10-12 DIAGNOSIS — N812 Incomplete uterovaginal prolapse: Secondary | ICD-10-CM | POA: Diagnosis not present

## 2015-10-12 DIAGNOSIS — N760 Acute vaginitis: Secondary | ICD-10-CM | POA: Diagnosis not present

## 2015-10-27 DIAGNOSIS — N762 Acute vulvitis: Secondary | ICD-10-CM | POA: Diagnosis not present

## 2015-10-27 DIAGNOSIS — N8189 Other female genital prolapse: Secondary | ICD-10-CM | POA: Diagnosis not present

## 2015-11-03 DIAGNOSIS — R5383 Other fatigue: Secondary | ICD-10-CM | POA: Diagnosis not present

## 2015-11-03 DIAGNOSIS — Z0000 Encounter for general adult medical examination without abnormal findings: Secondary | ICD-10-CM | POA: Diagnosis not present

## 2015-11-03 DIAGNOSIS — I10 Essential (primary) hypertension: Secondary | ICD-10-CM | POA: Diagnosis not present

## 2015-11-03 DIAGNOSIS — E039 Hypothyroidism, unspecified: Secondary | ICD-10-CM | POA: Diagnosis not present

## 2015-11-10 DIAGNOSIS — I10 Essential (primary) hypertension: Secondary | ICD-10-CM | POA: Diagnosis not present

## 2015-11-10 DIAGNOSIS — E039 Hypothyroidism, unspecified: Secondary | ICD-10-CM | POA: Diagnosis not present

## 2015-11-10 DIAGNOSIS — M150 Primary generalized (osteo)arthritis: Secondary | ICD-10-CM | POA: Diagnosis not present

## 2015-11-10 DIAGNOSIS — E871 Hypo-osmolality and hyponatremia: Secondary | ICD-10-CM | POA: Diagnosis not present

## 2015-11-11 DIAGNOSIS — Z1211 Encounter for screening for malignant neoplasm of colon: Secondary | ICD-10-CM | POA: Diagnosis not present

## 2015-11-11 DIAGNOSIS — N8189 Other female genital prolapse: Secondary | ICD-10-CM | POA: Diagnosis not present

## 2015-11-11 DIAGNOSIS — Z1212 Encounter for screening for malignant neoplasm of rectum: Secondary | ICD-10-CM | POA: Diagnosis not present

## 2015-11-23 DIAGNOSIS — E559 Vitamin D deficiency, unspecified: Secondary | ICD-10-CM | POA: Diagnosis not present

## 2015-11-23 DIAGNOSIS — E039 Hypothyroidism, unspecified: Secondary | ICD-10-CM | POA: Diagnosis not present

## 2015-11-23 DIAGNOSIS — G4700 Insomnia, unspecified: Secondary | ICD-10-CM | POA: Diagnosis not present

## 2015-11-23 DIAGNOSIS — R7989 Other specified abnormal findings of blood chemistry: Secondary | ICD-10-CM | POA: Diagnosis not present

## 2015-11-26 DIAGNOSIS — G4700 Insomnia, unspecified: Secondary | ICD-10-CM | POA: Diagnosis not present

## 2015-12-06 DIAGNOSIS — F411 Generalized anxiety disorder: Secondary | ICD-10-CM | POA: Diagnosis not present

## 2015-12-07 DIAGNOSIS — N760 Acute vaginitis: Secondary | ICD-10-CM | POA: Diagnosis not present

## 2015-12-07 DIAGNOSIS — N8189 Other female genital prolapse: Secondary | ICD-10-CM | POA: Diagnosis not present

## 2015-12-08 DIAGNOSIS — G4700 Insomnia, unspecified: Secondary | ICD-10-CM | POA: Diagnosis not present

## 2015-12-17 ENCOUNTER — Other Ambulatory Visit: Payer: Self-pay

## 2016-01-01 ENCOUNTER — Encounter: Payer: Self-pay | Admitting: Physician Assistant

## 2016-01-01 ENCOUNTER — Telehealth: Payer: Self-pay | Admitting: *Deleted

## 2016-01-01 ENCOUNTER — Ambulatory Visit (INDEPENDENT_AMBULATORY_CARE_PROVIDER_SITE_OTHER): Payer: Medicare Other | Admitting: Physician Assistant

## 2016-01-01 VITALS — BP 110/50 | HR 103 | Temp 98.3°F | Resp 16 | Ht 62.0 in | Wt 113.0 lb

## 2016-01-01 DIAGNOSIS — R000 Tachycardia, unspecified: Secondary | ICD-10-CM | POA: Diagnosis not present

## 2016-01-01 DIAGNOSIS — R6883 Chills (without fever): Secondary | ICD-10-CM | POA: Diagnosis not present

## 2016-01-01 DIAGNOSIS — N3001 Acute cystitis with hematuria: Secondary | ICD-10-CM | POA: Diagnosis not present

## 2016-01-01 LAB — POCT URINALYSIS DIP (MANUAL ENTRY)
Bilirubin, UA: NEGATIVE
Glucose, UA: NEGATIVE
Ketones, POC UA: NEGATIVE
Nitrite, UA: NEGATIVE
PH UA: 5.5
SPEC GRAV UA: 1.015
UROBILINOGEN UA: 0.2

## 2016-01-01 LAB — POC MICROSCOPIC URINALYSIS (UMFC)

## 2016-01-01 MED ORDER — CIPROFLOXACIN HCL 500 MG PO TABS: 500.0000 mg | ORAL_TABLET | Freq: Two times a day (BID) | ORAL | 0 refills | Status: DC

## 2016-01-01 MED ORDER — NITROFURANTOIN MONOHYD MACRO 100 MG PO CAPS: 100.0000 mg | ORAL_CAPSULE | Freq: Two times a day (BID) | ORAL | 0 refills | Status: DC

## 2016-01-01 MED ORDER — CIPROFLOXACIN HCL 500 MG PO TABS: 500.0000 mg | ORAL_TABLET | Freq: Two times a day (BID) | ORAL | 0 refills | Status: AC

## 2016-01-01 NOTE — Telephone Encounter (Signed)
Called pt she was advised that antibiotic was changed to Cipro, per Waverly Ferrari.

## 2016-01-01 NOTE — Progress Notes (Signed)
01/01/2016 1:50 PM   DOB: [birth year] / MRN: [id number]  SUBJECTIVE:  Patricia Beck is a 75 y.o. female presenting for an episode of chills that started and resolved about 48 hours ago.  Reports she woke up in the night and felt cold. She added layers and went back to sleep later to awaken feeling hot x 2, but by the second time it was already time to get up.  States she had a fever yesterday, all day, of 101.  Says that she sleep great last night and has no fever today.   She has a primary care doctor and has a good relationship with that provider.  She has multiple drug allergies (see below).   She is allergic to aciphex [rabeprazole sodium]; aleve [naproxen sodium]; atarax [hydroxyzine]; belsomra [suvorexant]; buspirone; codeine; erythromycin; keflex [cephalexin]; lidocaine; prednisone; tramadol; and zoloft [sertraline hcl].   She  has a past medical history of Cataract; Fibroma (2006); Hypertension; and Thyroid disease.    She  reports that she has never smoked. She does not have any smokeless tobacco history on file. She reports that she does not drink alcohol or use drugs. She  has no sexual activity history on file. The patient  has a past surgical history that includes fibroma and Eye surgery.  Her family history includes Diabetes in her father.  Review of Systems  Constitutional: Positive for chills and fever. Negative for diaphoresis and malaise/fatigue.  HENT: Negative for ear pain and sore throat.   Respiratory: Negative for cough and shortness of breath.   Cardiovascular: Negative for chest pain and leg swelling.  Gastrointestinal: Negative for nausea.  Genitourinary: Negative for dysuria, flank pain, frequency, hematuria and urgency.  Musculoskeletal: Negative for myalgias.  Skin: Negative for rash.  Neurological: Negative for dizziness, focal weakness, weakness and headaches.    The problem list and medications were reviewed and updated by myself where necessary and exist  elsewhere in the encounter.   OBJECTIVE:  BP (!) 110/50   Pulse (!) 103   Temp 98.3 F (36.8 C) (Oral)   Resp 16   Ht 5\' 2"  (1.575 m)   Wt 113 lb (51.3 kg)   SpO2 98%   BMI 20.67 kg/m   Physical Exam  Constitutional: She is oriented to person, place, and time. She appears well-developed and well-nourished.  HENT:  Right Ear: Tympanic membrane normal.  Left Ear: Tympanic membrane normal.  Nose: Nose normal.  Mouth/Throat: Uvula is midline, oropharynx is clear and moist and mucous membranes are normal.  Cardiovascular: Normal rate, regular rhythm and normal heart sounds.   Pulmonary/Chest: Effort normal and breath sounds normal.  Abdominal: Soft. Bowel sounds are normal. There is no tenderness.  Musculoskeletal: Normal range of motion.  Neurological: She is alert and oriented to person, place, and time. She has normal strength and normal reflexes. No cranial nerve deficit or sensory deficit. She displays no seizure activity. Coordination and gait normal.  Skin: Skin is warm and dry.  Psychiatric: She has a normal mood and affect. Her behavior is normal. Judgment and thought content normal.  Vitals reviewed.  Pulse Readings from Last 3 Encounters:  01/01/16 (!) 103  11/20/14 71  08/01/14 79   Lab Results  Component Value Date   CREATININE 0.84 08/01/2014     Results for orders placed or performed in visit on 01/01/16 (from the past 72 hour(s))  POCT urinalysis dipstick     Status: Abnormal   Collection Time: 01/01/16 11:52 AM  Result Value Ref Range   Color, UA yellow yellow   Clarity, UA cloudy (A) clear   Glucose, UA negative negative   Bilirubin, UA negative negative   Ketones, POC UA negative negative   Spec Grav, UA 1.015    Blood, UA large (A) negative   pH, UA 5.5    Protein Ur, POC =100 (A) negative   Urobilinogen, UA 0.2    Nitrite, UA Negative Negative   Leukocytes, UA small (1+) (A) Negative  POCT Microscopic Urinalysis ([facility])     Status: Abnormal    Collection Time: 01/01/16 12:19 PM  Result Value Ref Range   WBC,UR,HPF,POC Many (A) None WBC/hpf   RBC,UR,HPF,POC Moderate (A) None RBC/hpf   Bacteria Moderate (A) None, Too numerous to count   Mucus Present (A) Absent   Epithelial Cells, UR Per Microscopy Few (A) None, Too numerous to count cells/hpf    No results found.  ASSESSMENT AND PLAN  Patricia Beck was seen today for chills, hot flashes and fever.  Diagnoses and all orders for this visit:  Chills (without fever): Will cover with Cipro given this complaint and problem two despite negative fever and CVA tenderness.  Will await culture results to direct therapy.  -     POCT urinalysis dipstick -     POCT Microscopic Urinalysis ([facility])  Tachycardia -     Recheck vitals  Acute cystitis with hematuria -     ciprofloxacin (CIPRO) 500 MG tablet; Take 1 tablet (500 mg total) by mouth 2 (two) times daily. -     Urine culture  Other orders -     Discontinue: nitrofurantoin, macrocrystal-monohydrate, (MACROBID) 100 MG capsule; Take 1 capsule (100 mg total) by mouth 2 (two) times daily. -     Discontinue: ciprofloxacin (CIPRO) 500 MG tablet; Take 1 tablet (500 mg total) by mouth 2 (two) times daily.    The patient is advised to call or return to clinic if she does not see an improvement in symptoms, or to seek the care of the closest emergency department if she worsens with the above plan.   Philis Fendt, MHS, PA-C [facility] 01/01/2016 1:50 PM

## 2016-01-01 NOTE — Patient Instructions (Signed)
     IF you received an x-ray today, you will receive an invoice from [facility]. Please contact [facility] at [phone] with questions or concerns regarding your invoice.   IF you received labwork today, you will receive an invoice from [facility]. Please contact [facility] at [phone] with questions or concerns regarding your invoice.   Our billing staff will not be able to assist you with questions regarding bills from these companies.  You will be contacted with the lab results as soon as they are available. The fastest way to get your results is to activate your My Chart account. Instructions are located on the last page of this paperwork. If you have not heard from us regarding the results in 2 weeks, please contact this office.      

## 2016-01-03 ENCOUNTER — Ambulatory Visit (INDEPENDENT_AMBULATORY_CARE_PROVIDER_SITE_OTHER): Payer: Medicare Other | Admitting: Physician Assistant

## 2016-01-03 ENCOUNTER — Ambulatory Visit (INDEPENDENT_AMBULATORY_CARE_PROVIDER_SITE_OTHER): Payer: Medicare Other

## 2016-01-03 VITALS — BP 120/72 | HR 96 | Temp 98.3°F | Resp 18 | Ht 62.0 in | Wt 114.0 lb

## 2016-01-03 DIAGNOSIS — Z09 Encounter for follow-up examination after completed treatment for conditions other than malignant neoplasm: Secondary | ICD-10-CM

## 2016-01-03 DIAGNOSIS — K5900 Constipation, unspecified: Secondary | ICD-10-CM | POA: Diagnosis not present

## 2016-01-03 LAB — POCT CBC
GRANULOCYTE PERCENT: 78.8 % (ref 37–80)
HCT, POC: 31.5 % — AB (ref 37.7–47.9)
HEMOGLOBIN: 11 g/dL — AB (ref 12.2–16.2)
Lymph, poc: 2 (ref 0.6–3.4)
MCH: 32 pg — AB (ref 27–31.2)
MCHC: 349 g/dL — AB (ref 31.8–35.4)
MCV: 91.8 fL (ref 80–97)
MID (cbc): 0.5 (ref 0–0.9)
MPV: 6 fL (ref 0–99.8)
POC Granulocyte: 9.1 — AB (ref 2–6.9)
POC LYMPH PERCENT: 17 %L (ref 10–50)
POC MID %: 4.2 %M (ref 0–12)
Platelet Count, POC: 246 10*3/uL (ref 142–424)
RBC: 3.43 M/uL — AB (ref 4.04–5.48)
RDW, POC: 13.6 %
WBC: 11.6 10*3/uL — AB (ref 4.6–10.2)

## 2016-01-03 LAB — POCT URINALYSIS DIP (MANUAL ENTRY)
BILIRUBIN UA: NEGATIVE
Bilirubin, UA: NEGATIVE
Blood, UA: NEGATIVE
Glucose, UA: NEGATIVE
Nitrite, UA: NEGATIVE
PH UA: 6
SPEC GRAV UA: 1.01
Urobilinogen, UA: 0.2

## 2016-01-03 LAB — POC MICROSCOPIC URINALYSIS (UMFC): Mucus: ABSENT

## 2016-01-03 NOTE — Patient Instructions (Signed)
     IF you received an x-ray today, you will receive an invoice from [facility]. Please contact  [facility] at [phone] with questions or concerns regarding your invoice.   IF you received labwork today, you will receive an invoice from [facility]. Please contact [facility] at [phone] with questions or concerns regarding your invoice.   Our billing staff will not be able to assist you with questions regarding bills from these companies.  You will be contacted with the lab results as soon as they are available. The fastest way to get your results is to activate your My Chart account. Instructions are located on the last page of this paperwork. If you have not heard from us regarding the results in 2 weeks, please contact this office.      

## 2016-01-03 NOTE — Progress Notes (Signed)
01/03/2016 3:38 PM   DOB: February 28, 1941 / MRN: [id number]  SUBJECTIVE:  Patricia Beck is a 75 y.o. female presenting for recheck of UTI.  Reports she is having abdominal pain and muscle aches after starting Cipro.  She associates fatigue.  She has had four doses now.  She has a history of anxiety and reports that has been worse as well.    She is allergic to aciphex [rabeprazole sodium]; aleve [naproxen sodium]; atarax [hydroxyzine]; belsomra [suvorexant]; buspirone; codeine; erythromycin; keflex [cephalexin]; lidocaine; prednisone; tramadol; and zoloft [sertraline hcl].   She  has a past medical history of Cataract; Fibroma (2006); Hypertension; and Thyroid disease.    She  reports that she has never smoked. She has never used smokeless tobacco. She reports that she does not drink alcohol or use drugs. She  has no sexual activity history on file. The patient  has a past surgical history that includes fibroma and Eye surgery.  Her family history includes Diabetes in her father.  Review of Systems  Constitutional: Positive for malaise/fatigue. Negative for fever.  Respiratory: Negative for cough and shortness of breath.   Cardiovascular: Negative for chest pain.  Gastrointestinal: Positive for abdominal pain (generalized). Negative for nausea.  Musculoskeletal: Positive for myalgias.  Neurological: Positive for dizziness and weakness. Negative for headaches.    The problem list and medications were reviewed and updated by myself where necessary and exist elsewhere in the encounter.   OBJECTIVE:  BP 120/72 (BP Location: Right Arm, Patient Position: Sitting, Cuff Size: Small)   Pulse 96   Temp 98.3 F (36.8 C) (Oral)   Resp 18   Ht 5\' 2"  (1.575 m)   Wt 114 lb (51.7 kg)   SpO2 98%   BMI 20.85 kg/m   Physical Exam  Constitutional: She is oriented to person, place, and time. She appears well-developed and well-nourished.  Cardiovascular: Normal rate and regular rhythm.     Pulmonary/Chest: Effort normal and breath sounds normal. No respiratory distress. She has no wheezes. She has no rales. She exhibits no tenderness.  Abdominal: Soft. Bowel sounds are normal. She exhibits no distension and no mass. There is tenderness (diffuse). There is no rebound and no guarding.  Musculoskeletal: She exhibits no edema or tenderness.  Neurological: She is alert and oriented to person, place, and time.  Skin: Skin is warm and dry.    Results for orders placed or performed in visit on 01/03/16 (from the past 72 hour(s))  POCT CBC     Status: Abnormal   Collection Time: 01/03/16  3:00 PM  Result Value Ref Range   WBC 11.6 (A) 4.6 - 10.2 K/uL   Lymph, poc 2.0 0.6 - 3.4   POC LYMPH PERCENT 17.0 10 - 50 %L   MID (cbc) 0.5 0 - 0.9   POC MID % 4.2 0 - 12 %M   POC Granulocyte 9.1 (A) 2 - 6.9   Granulocyte percent 78.8 37 - 80 %G   RBC 3.43 (A) 4.04 - 5.48 M/uL   Hemoglobin 11.0 (A) 12.2 - 16.2 g/dL   HCT, POC 31.5 (A) 37.7 - 47.9 %   MCV 91.8 80 - 97 fL   MCH, POC 32.0 (A) 27 - 31.2 pg   MCHC 349 (A) 31.8 - 35.4 g/dL   RDW, POC 13.6 %   Platelet Count, POC 246 142 - 424 K/uL   MPV 6.0 0 - 99.8 fL  POCT urinalysis dipstick     Status: Abnormal  Collection Time: 01/03/16  3:01 PM  Result Value Ref Range   Color, UA yellow yellow   Clarity, UA clear clear   Glucose, UA negative negative   Bilirubin, UA negative negative   Ketones, POC UA negative negative   Spec Grav, UA 1.010    Blood, UA negative negative   pH, UA 6.0    Protein Ur, POC =30 (A) negative   Urobilinogen, UA 0.2    Nitrite, UA Negative Negative   Leukocytes, UA Trace (A) Negative  POCT Microscopic Urinalysis ([facility])     Status: Abnormal   Collection Time: 01/03/16  3:02 PM  Result Value Ref Range   WBC,UR,HPF,POC Few (A) None WBC/hpf   RBC,UR,HPF,POC None None RBC/hpf   Bacteria None None, Too numerous to count   Mucus Absent Absent   Epithelial Cells, UR Per Microscopy Few (A) None, Too  numerous to count cells/hpf   CBC Latest Ref Rng & Units 01/03/2016 08/01/2014 07/30/2014  WBC 4.6 - 10.2 K/uL 11.6(A) 9.2 16.6(H)  Hemoglobin 12.2 - 16.2 g/dL 11.0(A) 12.2 14.4  Hematocrit 37.7 - 47.9 % 31.5(A) 35.9(L) 39.3  Platelets 150 - 400 K/uL - 240 287     Dg Abd 2 Views  Result Date: 01/03/2016 CLINICAL DATA:  Constipation and left lower quadrant tenderness EXAM: ABDOMEN - 2 VIEW COMPARISON:  CT abdomen and pelvis March 22, 2004 FINDINGS: Frontal and lateral views were obtained. There is diffuse stool throughout the colon. There is no bowel dilatation or air-fluid level suggesting bowel obstruction. No free air evident. There is a rounded lucent structure in the lower pelvis midline, possibly a balloon catheter. Visualized lung bases are clear. There is marked lumbar levoscoliosis. There is atherosclerotic calcification in the aorta. IMPRESSION: Question balloon catheter lower pelvis midline. Diffuse stool throughout colon. No bowel obstruction or free air. Marked scoliosis. Electronically Signed   By: Lowella Grip III M.D.   On: 01/03/2016 15:29    ASSESSMENT AND PLAN  Patricia Beck was seen today for follow-up.  Diagnoses and all orders for this visit:  Follow up: Her urine has improved.Rads negative for hydronephrosis or any other acute finding.   I can find no other source today on exam or work up that suggest that something else is going on.  Given her improvement I am going to continue her Cipro and she has agreed to come back tomorrow if she is not improving.  I will give her a call at [phone] to see how she is feeling on Wednesday.  -     POCT CBC -     POCT urinalysis dipstick -     POCT Microscopic Urinalysis ([facility]) -     DG Abd 2 Views; Future    The patient is advised to call or return to clinic if she does not see an improvement in symptoms, or to seek the care of the closest emergency department if she worsens with the above plan.   Philis Fendt, MHS,  PA-C [facility] 01/03/2016 3:38 PM

## 2016-01-05 NOTE — Telephone Encounter (Signed)
LMOM to call back.  Philis Fendt, MS, PA-C 6:49 PM, 01/05/2016

## 2016-01-10 DIAGNOSIS — N390 Urinary tract infection, site not specified: Secondary | ICD-10-CM | POA: Diagnosis not present

## 2016-01-10 DIAGNOSIS — N760 Acute vaginitis: Secondary | ICD-10-CM | POA: Diagnosis not present

## 2016-01-10 DIAGNOSIS — N8189 Other female genital prolapse: Secondary | ICD-10-CM | POA: Diagnosis not present

## 2016-02-07 DIAGNOSIS — B373 Candidiasis of vulva and vagina: Secondary | ICD-10-CM | POA: Diagnosis not present

## 2016-02-07 DIAGNOSIS — N8189 Other female genital prolapse: Secondary | ICD-10-CM | POA: Diagnosis not present

## 2016-02-23 DIAGNOSIS — L821 Other seborrheic keratosis: Secondary | ICD-10-CM | POA: Diagnosis not present

## 2016-02-23 DIAGNOSIS — D2271 Melanocytic nevi of right lower limb, including hip: Secondary | ICD-10-CM | POA: Diagnosis not present

## 2016-02-23 DIAGNOSIS — D2272 Melanocytic nevi of left lower limb, including hip: Secondary | ICD-10-CM | POA: Diagnosis not present

## 2016-02-23 DIAGNOSIS — D692 Other nonthrombocytopenic purpura: Secondary | ICD-10-CM | POA: Diagnosis not present

## 2016-02-23 DIAGNOSIS — D225 Melanocytic nevi of trunk: Secondary | ICD-10-CM | POA: Diagnosis not present

## 2016-02-23 DIAGNOSIS — L218 Other seborrheic dermatitis: Secondary | ICD-10-CM | POA: Diagnosis not present

## 2016-02-23 DIAGNOSIS — D1801 Hemangioma of skin and subcutaneous tissue: Secondary | ICD-10-CM | POA: Diagnosis not present

## 2016-02-23 DIAGNOSIS — L720 Epidermal cyst: Secondary | ICD-10-CM | POA: Diagnosis not present

## 2016-02-29 DIAGNOSIS — E039 Hypothyroidism, unspecified: Secondary | ICD-10-CM | POA: Diagnosis not present

## 2016-03-06 DIAGNOSIS — N760 Acute vaginitis: Secondary | ICD-10-CM | POA: Diagnosis not present

## 2016-03-06 DIAGNOSIS — N390 Urinary tract infection, site not specified: Secondary | ICD-10-CM | POA: Diagnosis not present

## 2016-03-11 DIAGNOSIS — H6120 Impacted cerumen, unspecified ear: Secondary | ICD-10-CM | POA: Diagnosis not present

## 2016-03-11 DIAGNOSIS — H6983 Other specified disorders of Eustachian tube, bilateral: Secondary | ICD-10-CM | POA: Diagnosis not present

## 2016-03-22 DIAGNOSIS — N760 Acute vaginitis: Secondary | ICD-10-CM | POA: Diagnosis not present

## 2016-03-22 DIAGNOSIS — R350 Frequency of micturition: Secondary | ICD-10-CM | POA: Diagnosis not present

## 2016-03-22 DIAGNOSIS — N762 Acute vulvitis: Secondary | ICD-10-CM | POA: Diagnosis not present

## 2016-03-22 DIAGNOSIS — N390 Urinary tract infection, site not specified: Secondary | ICD-10-CM | POA: Diagnosis not present

## 2016-03-23 DIAGNOSIS — E039 Hypothyroidism, unspecified: Secondary | ICD-10-CM | POA: Diagnosis not present

## 2016-03-23 DIAGNOSIS — E785 Hyperlipidemia, unspecified: Secondary | ICD-10-CM | POA: Diagnosis not present

## 2016-03-23 DIAGNOSIS — F411 Generalized anxiety disorder: Secondary | ICD-10-CM | POA: Diagnosis not present

## 2016-03-23 DIAGNOSIS — I10 Essential (primary) hypertension: Secondary | ICD-10-CM | POA: Diagnosis not present

## 2016-03-27 DIAGNOSIS — N762 Acute vulvitis: Secondary | ICD-10-CM | POA: Diagnosis not present

## 2016-03-28 DIAGNOSIS — N813 Complete uterovaginal prolapse: Secondary | ICD-10-CM | POA: Diagnosis not present

## 2016-03-30 DIAGNOSIS — T8369XD Infection and inflammatory reaction due to other prosthetic device, implant and graft in genital tract, subsequent encounter: Secondary | ICD-10-CM | POA: Diagnosis not present

## 2016-04-03 DIAGNOSIS — N8189 Other female genital prolapse: Secondary | ICD-10-CM | POA: Diagnosis not present

## 2016-04-07 ENCOUNTER — Encounter (HOSPITAL_COMMUNITY): Payer: Self-pay

## 2016-04-24 DIAGNOSIS — I10 Essential (primary) hypertension: Secondary | ICD-10-CM | POA: Diagnosis not present

## 2016-04-24 DIAGNOSIS — E871 Hypo-osmolality and hyponatremia: Secondary | ICD-10-CM | POA: Diagnosis not present

## 2016-04-24 DIAGNOSIS — G4700 Insomnia, unspecified: Secondary | ICD-10-CM | POA: Diagnosis not present

## 2016-04-24 DIAGNOSIS — E039 Hypothyroidism, unspecified: Secondary | ICD-10-CM | POA: Diagnosis not present

## 2016-04-26 ENCOUNTER — Inpatient Hospital Stay (HOSPITAL_COMMUNITY)
Admission: EM | Admit: 2016-04-26 | Discharge: 2016-05-01 | DRG: 065 | Disposition: A | Discharge status: Skilled Nursing Facility | Payer: Medicare Other | Attending: Neurology | Admitting: Neurology

## 2016-04-26 ENCOUNTER — Emergency Department (HOSPITAL_COMMUNITY): Payer: Medicare Other

## 2016-04-26 ENCOUNTER — Encounter (HOSPITAL_COMMUNITY): Payer: Self-pay | Admitting: Emergency Medicine

## 2016-04-26 ENCOUNTER — Observation Stay (HOSPITAL_COMMUNITY): Payer: Medicare Other

## 2016-04-26 DIAGNOSIS — G9340 Encephalopathy, unspecified: Secondary | ICD-10-CM

## 2016-04-26 DIAGNOSIS — M419 Scoliosis, unspecified: Secondary | ICD-10-CM | POA: Diagnosis present

## 2016-04-26 DIAGNOSIS — I619 Nontraumatic intracerebral hemorrhage, unspecified: Secondary | ICD-10-CM | POA: Diagnosis not present

## 2016-04-26 DIAGNOSIS — G8929 Other chronic pain: Secondary | ICD-10-CM | POA: Diagnosis present

## 2016-04-26 DIAGNOSIS — R079 Chest pain, unspecified: Secondary | ICD-10-CM

## 2016-04-26 DIAGNOSIS — R51 Headache: Secondary | ICD-10-CM | POA: Diagnosis present

## 2016-04-26 DIAGNOSIS — Z888 Allergy status to other drugs, medicaments and biological substances status: Secondary | ICD-10-CM

## 2016-04-26 DIAGNOSIS — E039 Hypothyroidism, unspecified: Secondary | ICD-10-CM | POA: Diagnosis present

## 2016-04-26 DIAGNOSIS — R29810 Facial weakness: Secondary | ICD-10-CM | POA: Diagnosis present

## 2016-04-26 DIAGNOSIS — E222 Syndrome of inappropriate secretion of antidiuretic hormone: Secondary | ICD-10-CM | POA: Diagnosis present

## 2016-04-26 DIAGNOSIS — R58 Hemorrhage, not elsewhere classified: Secondary | ICD-10-CM

## 2016-04-26 DIAGNOSIS — M549 Dorsalgia, unspecified: Secondary | ICD-10-CM | POA: Diagnosis not present

## 2016-04-26 DIAGNOSIS — I639 Cerebral infarction, unspecified: Secondary | ICD-10-CM

## 2016-04-26 DIAGNOSIS — I169 Hypertensive crisis, unspecified: Secondary | ICD-10-CM | POA: Diagnosis not present

## 2016-04-26 DIAGNOSIS — I161 Hypertensive emergency: Secondary | ICD-10-CM | POA: Diagnosis present

## 2016-04-26 DIAGNOSIS — G4700 Insomnia, unspecified: Secondary | ICD-10-CM | POA: Diagnosis present

## 2016-04-26 DIAGNOSIS — E861 Hypovolemia: Secondary | ICD-10-CM | POA: Diagnosis present

## 2016-04-26 DIAGNOSIS — G8191 Hemiplegia, unspecified affecting right dominant side: Secondary | ICD-10-CM | POA: Diagnosis present

## 2016-04-26 DIAGNOSIS — I10 Essential (primary) hypertension: Secondary | ICD-10-CM | POA: Diagnosis present

## 2016-04-26 DIAGNOSIS — Z881 Allergy status to other antibiotic agents status: Secondary | ICD-10-CM

## 2016-04-26 DIAGNOSIS — Z885 Allergy status to narcotic agent status: Secondary | ICD-10-CM

## 2016-04-26 DIAGNOSIS — I610 Nontraumatic intracerebral hemorrhage in hemisphere, subcortical: Secondary | ICD-10-CM

## 2016-04-26 DIAGNOSIS — E870 Hyperosmolality and hypernatremia: Secondary | ICD-10-CM | POA: Diagnosis not present

## 2016-04-26 DIAGNOSIS — R531 Weakness: Secondary | ICD-10-CM | POA: Diagnosis not present

## 2016-04-26 DIAGNOSIS — E876 Hypokalemia: Secondary | ICD-10-CM | POA: Diagnosis present

## 2016-04-26 DIAGNOSIS — E878 Other disorders of electrolyte and fluid balance, not elsewhere classified: Secondary | ICD-10-CM | POA: Diagnosis present

## 2016-04-26 DIAGNOSIS — E871 Hypo-osmolality and hyponatremia: Secondary | ICD-10-CM | POA: Diagnosis present

## 2016-04-26 DIAGNOSIS — Z886 Allergy status to analgesic agent status: Secondary | ICD-10-CM

## 2016-04-26 DIAGNOSIS — F411 Generalized anxiety disorder: Secondary | ICD-10-CM | POA: Diagnosis present

## 2016-04-26 DIAGNOSIS — Z884 Allergy status to anesthetic agent status: Secondary | ICD-10-CM

## 2016-04-26 DIAGNOSIS — Z79899 Other long term (current) drug therapy: Secondary | ICD-10-CM

## 2016-04-26 LAB — URINALYSIS, ROUTINE W REFLEX MICROSCOPIC
Bilirubin Urine: NEGATIVE
Glucose, UA: NEGATIVE mg/dL
Hgb urine dipstick: NEGATIVE
Ketones, ur: NEGATIVE mg/dL
Leukocytes, UA: NEGATIVE
NITRITE: NEGATIVE
PROTEIN: NEGATIVE mg/dL
SPECIFIC GRAVITY, URINE: 1.004 — AB (ref 1.005–1.030)
pH: 9 — ABNORMAL HIGH (ref 5.0–8.0)

## 2016-04-26 LAB — OSMOLALITY: Osmolality: 272 mOsm/kg — ABNORMAL LOW (ref 275–295)

## 2016-04-26 LAB — BASIC METABOLIC PANEL
Anion gap: 10 (ref 5–15)
BUN: 18 mg/dL (ref 4–21)
BUN: 18 mg/dL (ref 6–20)
CALCIUM: 8.9 mg/dL (ref 8.9–10.3)
CHLORIDE: 90 mmol/L — AB (ref 101–111)
CO2: 23 mmol/L (ref 22–32)
CREATININE: 0.64 mg/dL (ref 0.44–1.00)
Creatinine: 0.6 mg/dL (ref 0.5–1.1)
GFR calc Af Amer: >60 mL/min (ref >60)
GFR calc non Af Amer: >60 mL/min (ref >60)
GLUCOSE: 128 mg/dL
Glucose, Bld: 128 mg/dL — ABNORMAL HIGH (ref 65–99)
POTASSIUM: 3.9 mmol/L (ref 3.4–5.3)
Potassium: 3.9 mmol/L (ref 3.5–5.1)
SODIUM: 123 mmol/L — AB (ref 137–147)
Sodium: 123 mmol/L — ABNORMAL LOW (ref 135–145)

## 2016-04-26 LAB — MAGNESIUM: Magnesium: 2 mg/dL (ref 1.7–2.4)

## 2016-04-26 LAB — I-STAT TROPONIN, ED: Troponin i, poc: 0 ng/mL (ref 0.00–0.08)

## 2016-04-26 LAB — CORTISOL-AM, BLOOD: Cortisol - AM: 19 ug/dL (ref 6.7–22.6)

## 2016-04-26 LAB — CBC
HCT: 36.9 % (ref 36.0–46.0)
Hemoglobin: 13.1 g/dL (ref 12.0–15.0)
MCH: 30.9 pg (ref 26.0–34.0)
MCHC: 35.5 g/dL (ref 30.0–36.0)
MCV: 87 fL (ref 78.0–100.0)
PLATELETS: 302 10*3/uL (ref 150–400)
RBC: 4.24 MIL/uL (ref 3.87–5.11)
RDW: 13.3 % (ref 11.5–15.5)
WBC: 10.7 10*3/uL — ABNORMAL HIGH (ref 4.0–10.5)

## 2016-04-26 LAB — TSH
TSH: 5.29 u[IU]/mL (ref 0.41–5.90)
TSH: 5.293 u[IU]/mL — AB (ref 0.350–4.500)

## 2016-04-26 LAB — CBC AND DIFFERENTIAL
HCT: 13 % — AB (ref 36–46)
HEMOGLOBIN: 13.1 g/dL (ref 12.0–16.0)
Platelets: 302 10*3/uL (ref 150–399)
WBC: 10.7 10^3/mL

## 2016-04-26 LAB — TROPONIN I: Troponin I: <0.03 ng/mL (ref <0.03)

## 2016-04-26 LAB — PHOSPHORUS: PHOSPHORUS: 2.4 mg/dL — AB (ref 2.5–4.6)

## 2016-04-26 LAB — GLUCOSE, CAPILLARY: Glucose-Capillary: 150 mg/dL — ABNORMAL HIGH (ref 65–99)

## 2016-04-26 MED ORDER — ZOLPIDEM TARTRATE 5 MG PO TABS
5.0000 mg | ORAL_TABLET | Freq: Every evening | ORAL | Status: DC | PRN
  Administered 2016-04-27 – 2016-04-30 (×5): 5 mg via ORAL
  Filled 2016-04-26 (×7): qty 1

## 2016-04-26 MED ORDER — ENOXAPARIN SODIUM 40 MG/0.4ML ~~LOC~~ SOLN
40.0000 mg | SUBCUTANEOUS | Status: DC
  Filled 2016-04-26: qty 0.4

## 2016-04-26 MED ORDER — BISOPROLOL FUMARATE 5 MG PO TABS
5.0000 mg | ORAL_TABLET | Freq: Every day | ORAL | Status: DC
  Administered 2016-04-27 – 2016-04-29 (×3): 5 mg via ORAL
  Filled 2016-04-26 (×3): qty 1

## 2016-04-26 MED ORDER — THYROID 60 MG PO TABS
60.0000 mg | ORAL_TABLET | Freq: Every day | ORAL | Status: DC
  Administered 2016-04-27 – 2016-04-29 (×3): 60 mg via ORAL
  Filled 2016-04-26 (×4): qty 1

## 2016-04-26 MED ORDER — LABETALOL HCL 5 MG/ML IV SOLN: 10.0000 mg | Freq: Once | INTRAVENOUS | Status: AC

## 2016-04-26 MED ORDER — AMLODIPINE BESYLATE 5 MG PO TABS: 5.0000 mg | ORAL_TABLET | Freq: Every day | ORAL | Status: DC

## 2016-04-26 MED ORDER — ONDANSETRON HCL 4 MG PO TABS: 4.0000 mg | ORAL_TABLET | Freq: Four times a day (QID) | ORAL | Status: DC | PRN

## 2016-04-26 MED ORDER — ACETAMINOPHEN 500 MG PO TABS
1000.0000 mg | ORAL_TABLET | Freq: Four times a day (QID) | ORAL | Status: DC | PRN
  Filled 2016-04-26: qty 2

## 2016-04-26 MED ORDER — ONDANSETRON HCL 4 MG/2ML IJ SOLN: 4.0000 mg | Freq: Four times a day (QID) | INTRAMUSCULAR | Status: DC | PRN

## 2016-04-26 MED ORDER — VITAMIN D 1000 UNITS PO TABS
5000.0000 [IU] | ORAL_TABLET | Freq: Every day | ORAL | Status: DC
  Administered 2016-04-27 – 2016-05-01 (×5): 5000 [IU] via ORAL
  Filled 2016-04-26 (×5): qty 5

## 2016-04-26 MED ORDER — VITAMIN C 500 MG PO TABS
1000.0000 mg | ORAL_TABLET | Freq: Every day | ORAL | Status: DC
  Administered 2016-04-27 – 2016-05-01 (×5): 1000 mg via ORAL
  Filled 2016-04-26 (×5): qty 2

## 2016-04-26 MED ORDER — NICARDIPINE HCL IN NACL 20-0.86 MG/200ML-% IV SOLN
3.0000 mg/h | INTRAVENOUS | Status: DC
  Administered 2016-04-26: 3 mg/h via INTRAVENOUS
  Administered 2016-04-27: 4 mg/h via INTRAVENOUS
  Administered 2016-04-28: 3 mg/h via INTRAVENOUS
  Filled 2016-04-26 (×2): qty 200

## 2016-04-26 MED ORDER — LABETALOL HCL 5 MG/ML IV SOLN
INTRAVENOUS | Status: AC
  Administered 2016-04-26: 10 mg
  Filled 2016-04-26: qty 4

## 2016-04-26 MED ORDER — HYDRALAZINE HCL 20 MG/ML IJ SOLN
10.0000 mg | Freq: Four times a day (QID) | INTRAMUSCULAR | Status: DC | PRN
  Administered 2016-04-28 – 2016-04-29 (×3): 10 mg via INTRAVENOUS
  Filled 2016-04-26 (×3): qty 1

## 2016-04-26 MED ORDER — SODIUM CHLORIDE 0.9 % IV SOLN
INTRAVENOUS | Status: DC
  Administered 2016-04-26 – 2016-04-28 (×3): via INTRAVENOUS

## 2016-04-26 MED ORDER — CALCIUM CITRATE 950 (200 CA) MG PO TABS
200.0000 mg | ORAL_TABLET | Freq: Every day | ORAL | Status: DC
  Administered 2016-04-27 – 2016-05-01 (×5): 200 mg via ORAL
  Filled 2016-04-26 (×5): qty 1

## 2016-04-26 NOTE — Progress Notes (Signed)
PATIENT GIVES PERMISSION TO [place] TO HAVE INFORMATION ABOUT HER CARE

## 2016-04-26 NOTE — Significant Event (Signed)
Rapid Response Event Note  Overview: Time Called: 2103 Arrival Time: 2107 Event Type: Neurologic  Initial Focused Assessment:Bedside RN called to evaluate patient who is stating that "she does not feel right". Admitted at 1800 to 1416. She states she has noticed for several hours that she is "gradually having difficulty with speech and right arm weaker. Unable to text." There is a right facial droop, speech is slurred, right grip weaker and right arm drift. No weakness of legs. Patient is able to follow commands, alert and oriented. Pupils equal and react to light.    Interventions:CT Stat of head. Chaney Malling NP notified, Code stroke called. Patient transferred to 1228 from CT.   Plan of Care (if not transferred):  Event Summary: Name of Physician Notified: Chaney Malling NP at 2120  Name of Consulting Physician Notified: Dr. Cheral Marker at 2135  Outcome: Transferred (Comment) (transferred to ICU with plan to go to [facility])  Event End Time: 2200  Pricilla Riffle

## 2016-04-26 NOTE — ED Triage Notes (Signed)
Patient reports intermittent dull central chest pain with no radiation x2 months. Hx anxiety. Reports "this may be related to my anxiety." Patient also reports 5 episodes of diarrhea today. Denies abdominal pain and vomiting.

## 2016-04-26 NOTE — ED Provider Notes (Signed)
[facility] DEPT Provider Note   CSN: [id number] Arrival date & time: 04/26/16  1111     History   Chief Complaint Chief Complaint  Patient presents with  . Chest Pain    HPI Patricia Beck is a 76 y.o. female.  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 Anxiety is characterized as chest discomfort which is been present for the past 2 months. Denies any associated anginal symptoms. No dyspnea or diaphoresis. Saw her physician 2 days ago for similar symptoms of anxiety was prescribed Vistaril which has not helped. Denies any suicidal or homicidal ideations. No other treatments used prior to arrival. Nothing makes her symptoms better.      Past Medical History:  Diagnosis Date  . Cataract   . Fibroma 2006  . Hypertension   . Thyroid disease     Patient Active Problem List   Diagnosis Date Noted  . Hypothyroidism 07/30/2014  . Hyponatremia 07/30/2014  . Hypokalemia 07/30/2014  . Hypochloremia 07/30/2014  . Nausea & vomiting 07/30/2014  . UTI (lower urinary tract infection) 07/30/2014  . Chronic back pain 07/30/2014  . Insomnia 07/30/2014  . Hypertension   . Sciatic neuritis 06/30/2014    Past Surgical History:  Procedure Laterality Date  . EYE SURGERY    . fibroma      OB History    No data available       Home Medications    Prior to Admission medications   Medication Sig Start Date End Date Taking? Authorizing Provider  acetaminophen (TYLENOL) 500 MG tablet Take 1,000 mg by mouth every 6 (six) hours as needed for moderate pain (pain).     Historical Provider, MD  amLODipine (NORVASC) 5 MG tablet Take 5 mg by mouth daily.    Historical Provider, MD  Ascorbic Acid (VITAMIN C) 1000 MG tablet Take 1,000 mg by mouth daily.    Historical Provider, MD  bisoprolol (ZEBETA) 5 MG tablet Take 5 mg by mouth daily.    Historical Provider, MD  calcium citrate  (CALCITRATE - DOSED IN MG ELEMENTAL CALCIUM) 950 MG tablet Take 200 mg of elemental calcium by mouth daily.    Historical Provider, MD  cholecalciferol (VITAMIN D) 1000 UNITS tablet Take 5,000 Units by mouth daily.    Historical Provider, MD  HYDROcodone-acetaminophen (NORCO/VICODIN) 5-325 MG per tablet Take 1 tablet by mouth every 4 (four) hours as needed for moderate pain. 08/01/14   Albertine Patricia, MD  Melatonin 3 MG TABS Take 15-18 mg by mouth at bedtime.    Historical Provider, MD  thyroid (ARMOUR) 60 MG tablet Take 60 mg by mouth daily before breakfast.    Historical Provider, MD  vitamin E 100 UNIT capsule Take by mouth daily.    Historical Provider, MD  zolpidem (AMBIEN) 5 MG tablet Take 1 tablet (5 mg total) by mouth at bedtime as needed for sleep. 08/01/14   Albertine Patricia, MD    Family History Family History  Problem Relation Age of Onset  . Diabetes Father     Social History Social History  Substance Use Topics  . Smoking status: Never Smoker  . Smokeless tobacco: Never Used  . Alcohol use No     Allergies   Aciphex [rabeprazole sodium]; Aleve [naproxen sodium]; Atarax [hydroxyzine]; Belsomra [suvorexant]; Buspirone; Codeine; Erythromycin; Keflex [cephalexin]; Lidocaine; Prednisone; Tramadol; and Zoloft [sertraline hcl]   Review of Systems Review of Systems  All  other systems reviewed and are negative.    Physical Exam Updated Vital Signs BP 183/89 (BP Location: Left Arm)   Pulse 89   Temp 98.2 F (36.8 C) (Oral)   Resp 18   Ht 5\' 2"  (1.575 m)   Wt 48.5 kg   SpO2 100%   BMI 19.57 kg/m   Physical Exam  Constitutional: She is oriented to person, place, and time. She appears well-developed and well-nourished.  Non-toxic appearance. No distress.  HENT:  Head: Normocephalic and atraumatic.  Eyes: Conjunctivae, EOM and lids are normal. Pupils are equal, round, and reactive to light.  Neck: Normal range of motion. Neck supple. No tracheal deviation  present. No thyroid mass present.  Cardiovascular: Normal rate, regular rhythm and normal heart sounds.  Exam reveals no gallop.   No murmur heard. Pulmonary/Chest: Effort normal and breath sounds normal. No stridor. No respiratory distress. She has no decreased breath sounds. She has no wheezes. She has no rhonchi. She has no rales.  Abdominal: Soft. Normal appearance and bowel sounds are normal. She exhibits no distension. There is no tenderness. There is no rebound and no CVA tenderness.  Musculoskeletal: Normal range of motion. She exhibits no edema or tenderness.  Neurological: She is alert and oriented to person, place, and time. She has normal strength. No cranial nerve deficit or sensory deficit. GCS eye subscore is 4. GCS verbal subscore is 5. GCS motor subscore is 6.  Skin: Skin is warm and dry. No abrasion and no rash noted.  Psychiatric: Her speech is normal and behavior is normal. Her mood appears anxious.  Nursing note and vitals reviewed.    ED Treatments / Results  Labs (all labs ordered are listed, but only abnormal results are displayed) Labs Reviewed  BASIC METABOLIC PANEL - Abnormal; Notable for the following:       Result Value   Sodium 123 (*)    Chloride 90 (*)    Glucose, Bld 128 (*)    All other components within normal limits  CBC - Abnormal; Notable for the following:    WBC 10.7 (*)    All other components within normal limits  I-STAT TROPOININ, ED    EKG  EKG Interpretation  Date/Time:  Wednesday April 26 2016 11:24:40 EST Ventricular Rate:  76 PR Interval:    QRS Duration: 132 QT Interval:  395 QTC Calculation: 445 R Axis:   71 Text Interpretation:  Sinus rhythm Probable left atrial enlargement Right bundle branch block No significant change since last tracing Confirmed by Zenia Resides  MD, Allegra Cerniglia ([id number]) on 04/26/2016 3:39:04 PM       Radiology Dg Chest 2 View  Result Date: 04/26/2016 CLINICAL DATA:  Dull chest pain for 2 days. EXAM: CHEST  2  VIEW COMPARISON:  10/29/2014. FINDINGS: The heart size and mediastinal contours are within normal limits. Both lungs are clear. Severe thoracolumbar scoliosis convex RIGHT, unchanged. IMPRESSION: No active cardiopulmonary disease.  Stable exam. Electronically Signed   By: Staci Righter M.D.   On: 04/26/2016 12:06    Procedures Procedures (including critical care time)  Medications Ordered in ED Medications - No data to display   Initial Impression / Assessment and Plan / ED Course  I have reviewed the triage vital signs and the nursing notes.  Pertinent labs & imaging results that were available during my care of the patient were reviewed by me and considered in my medical decision making (see chart for details).  Clinical Course  Patient evidence of hyponatremia on her electrolytes. Complains of increased weakness. Will admit to hospitalist for observation.  Final Clinical Impressions(s) / ED Diagnoses   Final diagnoses:  None    New Prescriptions New Prescriptions   No medications on file     Lacretia Leigh, MD 04/26/16 1555

## 2016-04-26 NOTE — Progress Notes (Signed)
While assisting patient to bedside commode, she was unable to stand by herself due to weakness, particularly right sided weakness. She stated she "just didn't feel right." Neuro assessments performed. Right hand weakness and drifting noted, as well as slurred speech. Charge nurse Arrie Aran was called to assess patient, as well as Malachy Mood, the rapid response RN. Stat CT scan was ordered. Patient was transferred to ICU after CT scan. Report given to [place], RN in ICU.

## 2016-04-26 NOTE — Progress Notes (Signed)
eLink Physician-Brief Progress Note Patient Name: LANIA WENDLANDT DOB: 1940/05/03 MRN: [id number]   Date of Service  04/26/2016  HPI/Events of Note  Patient with L internal capsule ICH. Currently on Lovenox for DVT prophylaxis.   eICU Interventions  Will order: 1. D/C Lovenox. 2. Place SCD's to bilateral LE's.    Intervention Category Major Interventions: Other:  Lysle Dingwall 04/26/2016, 10:52 PM

## 2016-04-26 NOTE — Consult Note (Signed)
PULMONARY / CRITICAL CARE MEDICINE   Name: Patricia Beck MRN: [id number] DOB: [birth year]    ADMISSION DATE:  04/26/2016 CONSULTATION DATE:  1/10  REFERRING MD:  Dr. Ree Kida  CHIEF COMPLAINT:  ICH  HISTORY OF PRESENT ILLNESS:   76 year old female with PMH as below, which is significant for HTN, Hypothyroid, anxiety, and hyponatremia. She presented to [facility] ED 1/10 with complaints of chest pain x 2 months. She felt as if this pain was related to her anxiety, which has been bothering her recently. 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 She was admitted to stepdown with PRN antihypertensives and IVF. Then while ambulating to bedside commode she was noted to have facial droop and was sent for CT of the head. She was found to have intracranial hemorrhage. She remained hypertensive and was started on nicardipine infusion. She was transferred to [facility] for neurology evaluation. PCCM to see.   PAST MEDICAL HISTORY :  She  has a past medical history of Cataract; Fibroma (2006); Hypertension; and Thyroid disease.  PAST SURGICAL HISTORY: She  has a past surgical history that includes fibroma and Eye surgery.  Allergies  Allergen Reactions  . Aciphex [Rabeprazole Sodium]   . Aleve [Naproxen Sodium]   . Atarax [Hydroxyzine]   . Belsomra [Suvorexant] Other (See Comments)    Nightmares  . Buspirone   . Codeine   . Erythromycin   . Keflex [Cephalexin]   . Lidocaine   . Prednisone     Insomnia, nausea, hot flush   . Tramadol   . Zoloft [Sertraline Hcl]     No current facility-administered medications on file prior to encounter.    Current Outpatient Prescriptions on File Prior to Encounter  Medication Sig  . acetaminophen (TYLENOL) 500 MG tablet Take 1,000 mg by mouth every 6 (six) hours as needed for moderate pain (pain).   Marland Kitchen amLODipine (NORVASC) 5 MG tablet Take 5 mg by mouth daily.  .  Ascorbic Acid (VITAMIN C) 1000 MG tablet Take 1,000 mg by mouth daily.  . bisoprolol (ZEBETA) 5 MG tablet Take 5 mg by mouth daily.  . calcium citrate (CALCITRATE - DOSED IN MG ELEMENTAL CALCIUM) 950 MG tablet Take 200 mg of elemental calcium by mouth daily.  . cholecalciferol (VITAMIN D) 1000 UNITS tablet Take 5,000 Units by mouth daily.  . Melatonin 3 MG TABS Take 15-18 mg by mouth at bedtime.  Marland Kitchen thyroid (ARMOUR) 65 MG tablet Take 65 mg by mouth daily before breakfast.   . vitamin E 100 UNIT capsule Take by mouth daily.  Marland Kitchen zolpidem (AMBIEN) 5 MG tablet Take 1 tablet (5 mg total) by mouth at bedtime as needed for sleep.    FAMILY HISTORY:  Her indicated that her mother is deceased. She indicated that her father is deceased.    SOCIAL HISTORY: She  reports that she has never smoked. She has never used smokeless tobacco. She reports that she does not drink alcohol or use drugs.  REVIEW OF SYSTEMS:   12 point ROS is negative other than above.  SUBJECTIVE:    VITAL SIGNS: BP (!) 181/95   Pulse 88   Temp 99 F (37.2 C) (Oral)   Resp (!) 22   Ht 5\' 2"  (1.575 m)   Wt 48.8 kg (107 lb 9.4 oz)   SpO2 98%   BMI 19.68 kg/m   HEMODYNAMICS:    VENTILATOR SETTINGS:  INTAKE / OUTPUT: No intake/output data recorded.  PHYSICAL EXAMINATION: General:  Chronically ill appearing female, NAD Neuro:  Awake and interactive, moving all ext to command but weak on the right. HEENT:  /AT, PERRL, EOM-I and MMM. Cardiovascular:  RRR, Nl S1/S2, -M/R/G. Lungs:  CTA bilaterally. Abdomen:  Soft, NT, ND and +BS Musculoskeletal:  -edema and -tenderness. Skin:  Intact.  LABS:  BMET  Recent Labs Lab 04/26/16 1139  NA 123*  K 3.9  CL 90*  CO2 23  BUN 18  CREATININE 0.64  GLUCOSE 128*    Electrolytes  Recent Labs Lab 04/26/16 1139 04/26/16 2052  CALCIUM 8.9  --   MG  --  2.0  PHOS  --  2.4*    CBC  Recent Labs Lab 04/26/16 1139  WBC 10.7*  HGB 13.1  HCT 36.9  PLT  302    Coag's No results for input(s): APTT, INR in the last 168 hours.  Sepsis Markers No results for input(s): LATICACIDVEN, PROCALCITON, O2SATVEN in the last 168 hours.  ABG No results for input(s): PHART, PCO2ART, PO2ART in the last 168 hours.  Liver Enzymes No results for input(s): AST, ALT, ALKPHOS, BILITOT, ALBUMIN in the last 168 hours.  Cardiac Enzymes  Recent Labs Lab 04/26/16 2052  TROPONINI <0.03    Glucose  Recent Labs Lab 04/26/16 2128  GLUCAP 150*    Imaging Dg Chest 2 View  Result Date: 04/26/2016 CLINICAL DATA:  Dull chest pain for 2 days. EXAM: CHEST  2 VIEW COMPARISON:  10/29/2014. FINDINGS: The heart size and mediastinal contours are within normal limits. Both lungs are clear. Severe thoracolumbar scoliosis convex RIGHT, unchanged. IMPRESSION: No active cardiopulmonary disease.  Stable exam. Electronically Signed   By: Staci Righter M.D.   On: 04/26/2016 12:06   Ct Head Wo Contrast  Result Date: 04/26/2016 CLINICAL DATA:  Clinical findings consistent with stroke. Changes in speech and right arm over the last few hours. EXAM: CT HEAD WITHOUT CONTRAST TECHNIQUE: Contiguous axial images were obtained from the base of the skull through the vertex without intravenous contrast. COMPARISON:  MRI brain 03/04/2010.  CT head 12/09/2008 FINDINGS: Brain: There is an acute intraparenchymal hematoma in the region of the left internal capsule measuring 2.2 x 2.3 cm in diameter. Mild surrounding edema. No mass effect or midline shift. Diffuse cerebral atrophy. Mild ventricular dilatation consistent with central atrophy. Low-attenuation changes in the deep white matter consistent with small vessel ischemia. No abnormal extra-axial fluid collections. Gray-white matter junctions are distinct. Basal cisterns are not effaced. Vascular: Diffuse vascular calcifications. The left vertebral artery is densely calcified and ectatic. Appearance is similar to prior studies. Skull:  Normal. Negative for fracture or focal lesion. Sinuses/Orbits: No acute finding. Other: None. IMPRESSION: Acute intraparenchymal hemorrhage in the left internal capsule region with mild surrounding edema. Underlying chronic atrophy and small vessel ischemia. These results were called by telephone at the time of interpretation on 04/26/2016 at 10:03 pm to Dr. Cristal Ford , who verbally acknowledged these results. Electronically Signed   By: Lucienne Capers M.D.   On: 04/26/2016 22:11     STUDIES:  CT head 1/10 > Acute intraparenchymal hemorrhage in the left internal capsule region with mild surrounding edema. Underlying chronic atrophy and small vessel ischemia.  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 P:   NS @ 44mL/Hr Urine sodium assess BMP q 2 hours Fluid restrict  GASTROINTESTINAL A:   No acute issues  P:   NPO  HEMATOLOGIC A:   No acute issues  P:  Follow CBC  INFECTIOUS A:   No acute issues  P:   Follow WBC and fever curve  ENDOCRINE A:   Hypothyroid  P:   Continue thyroid supplementation   FAMILY  - Updates: Patient updated  - Inter-disciplinary family meet or Palliative Care meeting due by:  1/17   Georgann Housekeeper, AGACNP-BC [place] [facility]/Critical Care Pager [phone] or [phone]  04/26/2016 11:23 PM  Attending Note:  76 year old female with HTN and thyroid disease who presents with chest pain and was  noted to be hyponatremic for unknown reason with previous Na level of 111 and now 123.  Patient had severe hypertension, facial droop and weakness on the right.  Code stroke was called and a  head CT was ordered that revealed left ICH and patient was to be transferred to [facility] for NS to evaluate and for BP control via a cardene drip.  On exam, facial droop noted, lungs are clear to auscultation.  Head CT I reviewed myself that showed a left ICH.  Will admit to the ICU.  Cardene drip for BP parameters per neurology.  Monitor in the ICU for airway protection.  Urine Na ordered.  NS for Na correction.  Frequent BMET checks.  NPO incase of intubation.  Further recommendations per neuro.  The patient is critically ill with multiple organ systems failure and requires high complexity decision making for assessment and support, frequent evaluation and titration of therapies, application of advanced monitoring technologies and extensive interpretation of multiple databases.   Critical Care Time devoted to patient care services described in this note is  35  Minutes. This time reflects time of care of this signee Dr Jennet Maduro. This critical care time does not reflect procedure time, or teaching time or supervisory time of PA/NP/Med student/Med Resident etc but could involve care discussion time.  Rush Farmer, M.D. [facility] Pulmonary/Critical Care Medicine. Pager: [phone]. After hours pager: [phone].

## 2016-04-26 NOTE — H&P (Addendum)
[facility] History and Physical  Patricia Beck [id number] DOB: [birth year] DOA: 04/26/2016  PCP: Pcp Not In System  Patient coming from: home  Chief Complaint: chest pain   HPI: Patricia Beck is a 76 y.o. female with a medical history of hypothyroidism, hypertension, anxiety, hyponatremia with hospitalization in 2016, presented with complaints of chest pain. Patient states she went to go see her primary care physician a couple of days ago and was placed on Vistaril however could not tolerate the medication and used half of the dose prescribed.  Today patient presents with increasing back pain, anxiety. She states her chest discomfort is due to her anxiety and has been present for the past 2 months. Patient denies any shortness of breath, diaphoresis, nausea. Nothing seems to make her pain better or worse. Patient states that she sleeps facedown for her back pain. Patient did not receive her flu or pneumonia vaccinations. She denies any recent illness or sick contacts, or travel. She denies abdominal pain, nausea, vomiting, dizziness, headache, shortness of breath. She does state that she feels her mouth is dry with like to drink water. She does state she drinks approximately 6-8 glasses of water per day. Size unknown.  ED Course: Found to have sodium 123 and complained of chest pain secondary to anxiety. Troponin negative. CXR negative for infection. TRH called for admission.   Review of Systems:  All other systems reviewed and are negative.   Past Medical History:  Diagnosis Date  . Cataract   . Fibroma 2006  . Hypertension   . Thyroid disease     Past Surgical History:  Procedure Laterality Date  . EYE SURGERY    . fibroma      Social History:  reports that she has never smoked. She has never used smokeless tobacco. She reports that she does not drink alcohol or use drugs.   Allergies  Allergen Reactions  . Aciphex [Rabeprazole Sodium]   . Aleve [Naproxen Sodium]    . Atarax [Hydroxyzine]   . Belsomra [Suvorexant] Other (See Comments)    Nightmares  . Buspirone   . Codeine   . Erythromycin   . Keflex [Cephalexin]   . Lidocaine   . Prednisone     Insomnia, nausea, hot flush   . Tramadol   . Zoloft [Sertraline Hcl]     Family History  Problem Relation Age of Onset  . Diabetes Father     Prior to Admission medications   Medication Sig Start Date End Date Taking? Authorizing Provider  acetaminophen (TYLENOL) 500 MG tablet Take 1,000 mg by mouth every 6 (six) hours as needed for moderate pain (pain).    Yes Historical Provider, MD  amLODipine (NORVASC) 5 MG tablet Take 5 mg by mouth daily.   Yes Historical Provider, MD  Ascorbic Acid (VITAMIN C) 1000 MG tablet Take 1,000 mg by mouth daily.   Yes Historical Provider, MD  bisoprolol (ZEBETA) 5 MG tablet Take 5 mg by mouth daily.   Yes Historical Provider, MD  calcium citrate (CALCITRATE - DOSED IN MG ELEMENTAL CALCIUM) 950 MG tablet Take 200 mg of elemental calcium by mouth daily.   Yes Historical Provider, MD  cholecalciferol (VITAMIN D) 1000 UNITS tablet Take 5,000 Units by mouth daily.   Yes Historical Provider, MD  Melatonin 3 MG TABS Take 15-18 mg by mouth at bedtime.   Yes Historical Provider, MD  thyroid (ARMOUR) 65 MG tablet Take 65 mg by mouth daily before breakfast.  Yes Historical Provider, MD  vitamin E 100 UNIT capsule Take by mouth daily.   Yes Historical Provider, MD  zolpidem (AMBIEN) 5 MG tablet Take 1 tablet (5 mg total) by mouth at bedtime as needed for sleep. 08/01/14  Yes Albertine Patricia, MD    Physical Exam: Vitals:   04/26/16 1459 04/26/16 1610  BP: 183/89 (!) 156/119  Pulse: 89 74  Resp: 18 16  Temp:       General: Well developed, thin, NAD, appears stated age  HEENT: NCAT, PERRLA, EOMI, Anicteic Sclera, mucous membranes moist.   Neck: Supple, no JVD, no masses  Cardiovascular: S1 S2 auscultated, no rubs, murmurs or gallops. Regular rate and  rhythm.  Respiratory: Clear to auscultation bilaterally with equal chest rise  Abdomen: Soft, nontender, nondistended, + bowel sounds  Extremities: warm dry without cyanosis clubbing or edema  Neuro: AAOx3, cranial nerves grossly intact. Strength 5/5 in patient's upper and lower extremities bilaterally  Skin: Without rashes exudates or nodules  Psych: Extremely anxious.   Labs on Admission: I have personally reviewed following labs and imaging studies CBC:  Recent Labs Lab 04/26/16 1139  WBC 10.7*  HGB 13.1  HCT 36.9  MCV 87.0  PLT 99991111   Basic Metabolic Panel:  Recent Labs Lab 04/26/16 1139  NA 123*  K 3.9  CL 90*  CO2 23  GLUCOSE 128*  BUN 18  CREATININE 0.64  CALCIUM 8.9   GFR: Estimated Creatinine Clearance: 47.2 mL/min (by C-G formula based on SCr of 0.64 mg/dL). Liver Function Tests: No results for input(s): AST, ALT, ALKPHOS, BILITOT, PROT, ALBUMIN in the last 168 hours. No results for input(s): LIPASE, AMYLASE in the last 168 hours. No results for input(s): AMMONIA in the last 168 hours. Coagulation Profile: No results for input(s): INR, PROTIME in the last 168 hours. Cardiac Enzymes: No results for input(s): CKTOTAL, CKMB, CKMBINDEX, TROPONINI in the last 168 hours. BNP (last 3 results) No results for input(s): PROBNP in the last 8760 hours. HbA1C: No results for input(s): HGBA1C in the last 72 hours. CBG: No results for input(s): GLUCAP in the last 168 hours. Lipid Profile: No results for input(s): CHOL, HDL, LDLCALC, TRIG, CHOLHDL, LDLDIRECT in the last 72 hours. Thyroid Function Tests: No results for input(s): TSH, T4TOTAL, FREET4, T3FREE, THYROIDAB in the last 72 hours. Anemia Panel: No results for input(s): VITAMINB12, FOLATE, FERRITIN, TIBC, IRON, RETICCTPCT in the last 72 hours. Urine analysis:    Component Value Date/Time   COLORURINE YELLOW 07/30/2014 1728   APPEARANCEUR CLOUDY (A) 07/30/2014 1728   LABSPEC 1.010 07/30/2014 1728    PHURINE 7.5 07/30/2014 1728   GLUCOSEU NEGATIVE 07/30/2014 1728   HGBUR NEGATIVE 07/30/2014 1728   BILIRUBINUR negative 01/03/2016 1501   KETONESUR negative 01/03/2016 1501   KETONESUR 15 (A) 07/30/2014 1728   PROTEINUR =30 (A) 01/03/2016 1501   PROTEINUR NEGATIVE 07/30/2014 1728   UROBILINOGEN 0.2 01/03/2016 1501   UROBILINOGEN 0.2 07/30/2014 1728   NITRITE Negative 01/03/2016 1501   NITRITE NEGATIVE 07/30/2014 1728   LEUKOCYTESUR Trace (A) 01/03/2016 1501   Sepsis Labs: @LABRCNTIP (procalcitonin:4,lacticidven:4) )No results found for this or any previous visit (from the past 240 hour(s)).   Radiological Exams on Admission: Dg Chest 2 View  Result Date: 04/26/2016 CLINICAL DATA:  Dull chest pain for 2 days. EXAM: CHEST  2 VIEW COMPARISON:  10/29/2014. FINDINGS: The heart size and mediastinal contours are within normal limits. Both lungs are clear. Severe thoracolumbar scoliosis convex RIGHT, unchanged. IMPRESSION: No active  cardiopulmonary disease.  Stable exam. Electronically Signed   By: Staci Righter M.D.   On: 04/26/2016 12:06    EKG: Independently reviewed. Sinus rhythm, rate 76, RBBB  Assessment/Plan  Hyponatremia -Possible secondary to SIADH vs polydipsia  -Patient has been admitted with low sodium in the past (111) -Patient complains of feeling thirsty  -Will place patient on fluid restriction and continue to monitor BMP closely. -Will check TSH and free T4 levels, urine sodium, urine osm, serum osm -Last Sodium in Nov 2017 was 132  Chest pain -Likely secondary to anxiety -Troponin negative, continue to cycle -EKG unchanged from previous  -Will obtain TSH, Magnesium, phos levels  Anxiety -Spoke with PCP's office, has been a chronic issue -Cannot seem to treat as patient has many drug intolerances or is afraid of addictiveness of medications -Patient is to follow up with a psychiatrist in February   Hypothyroidism -Will check TSH and FT4 -Continue  Armour  Chronic pain -states she cannot take codeine containing meds or tramadol.  -Tylenol and ibuprofen have not been working for her.  Accelerated hypertension -Possibly due to anxiety and back pain -Will add on hydralazine  Insomnia -Continue ambien  DVT prophylaxis: Lovenox  Code Status: Full  Family Communication: Friend at bedside. Admission, patients condition and plan of care including tests being ordered have been discussed with the patient and  who indicate understanding and agree with the plan and Code Status.  Disposition Plan: Home when stable.   Consults called: None   Admission status: Observation   Time spent: 70 minutes  Britteney Ayotte D.O. [facility] Pager [phone]  If 7PM-7AM, please contact night-coverage [email or web address] Password [facility] 04/26/2016, 4:34 PM

## 2016-04-27 ENCOUNTER — Observation Stay (HOSPITAL_COMMUNITY): Payer: Medicare Other

## 2016-04-27 ENCOUNTER — Inpatient Hospital Stay (HOSPITAL_COMMUNITY): Payer: Medicare Other

## 2016-04-27 DIAGNOSIS — Z885 Allergy status to narcotic agent status: Secondary | ICD-10-CM | POA: Diagnosis not present

## 2016-04-27 DIAGNOSIS — R262 Difficulty in walking, not elsewhere classified: Secondary | ICD-10-CM | POA: Diagnosis not present

## 2016-04-27 DIAGNOSIS — I619 Nontraumatic intracerebral hemorrhage, unspecified: Secondary | ICD-10-CM | POA: Diagnosis present

## 2016-04-27 DIAGNOSIS — M6281 Muscle weakness (generalized): Secondary | ICD-10-CM | POA: Diagnosis not present

## 2016-04-27 DIAGNOSIS — E861 Hypovolemia: Secondary | ICD-10-CM | POA: Diagnosis present

## 2016-04-27 DIAGNOSIS — G4700 Insomnia, unspecified: Secondary | ICD-10-CM | POA: Diagnosis present

## 2016-04-27 DIAGNOSIS — I610 Nontraumatic intracerebral hemorrhage in hemisphere, subcortical: Secondary | ICD-10-CM | POA: Diagnosis not present

## 2016-04-27 DIAGNOSIS — R278 Other lack of coordination: Secondary | ICD-10-CM | POA: Diagnosis not present

## 2016-04-27 DIAGNOSIS — Z881 Allergy status to other antibiotic agents status: Secondary | ICD-10-CM | POA: Diagnosis not present

## 2016-04-27 DIAGNOSIS — E876 Hypokalemia: Secondary | ICD-10-CM | POA: Diagnosis present

## 2016-04-27 DIAGNOSIS — E878 Other disorders of electrolyte and fluid balance, not elsewhere classified: Secondary | ICD-10-CM | POA: Diagnosis present

## 2016-04-27 DIAGNOSIS — Z886 Allergy status to analgesic agent status: Secondary | ICD-10-CM | POA: Diagnosis not present

## 2016-04-27 DIAGNOSIS — R58 Hemorrhage, not elsewhere classified: Secondary | ICD-10-CM | POA: Diagnosis not present

## 2016-04-27 DIAGNOSIS — I169 Hypertensive crisis, unspecified: Secondary | ICD-10-CM | POA: Diagnosis present

## 2016-04-27 DIAGNOSIS — R488 Other symbolic dysfunctions: Secondary | ICD-10-CM | POA: Diagnosis not present

## 2016-04-27 DIAGNOSIS — E039 Hypothyroidism, unspecified: Secondary | ICD-10-CM | POA: Diagnosis not present

## 2016-04-27 DIAGNOSIS — I69351 Hemiplegia and hemiparesis following cerebral infarction affecting right dominant side: Secondary | ICD-10-CM | POA: Diagnosis not present

## 2016-04-27 DIAGNOSIS — E871 Hypo-osmolality and hyponatremia: Secondary | ICD-10-CM | POA: Diagnosis not present

## 2016-04-27 DIAGNOSIS — I629 Nontraumatic intracranial hemorrhage, unspecified: Secondary | ICD-10-CM

## 2016-04-27 DIAGNOSIS — Z79899 Other long term (current) drug therapy: Secondary | ICD-10-CM | POA: Diagnosis not present

## 2016-04-27 DIAGNOSIS — Z888 Allergy status to other drugs, medicaments and biological substances status: Secondary | ICD-10-CM | POA: Diagnosis not present

## 2016-04-27 DIAGNOSIS — M549 Dorsalgia, unspecified: Secondary | ICD-10-CM | POA: Diagnosis not present

## 2016-04-27 DIAGNOSIS — I10 Essential (primary) hypertension: Secondary | ICD-10-CM | POA: Diagnosis not present

## 2016-04-27 DIAGNOSIS — R471 Dysarthria and anarthria: Secondary | ICD-10-CM | POA: Diagnosis not present

## 2016-04-27 DIAGNOSIS — G8191 Hemiplegia, unspecified affecting right dominant side: Secondary | ICD-10-CM | POA: Diagnosis present

## 2016-04-27 DIAGNOSIS — I639 Cerebral infarction, unspecified: Secondary | ICD-10-CM

## 2016-04-27 DIAGNOSIS — E222 Syndrome of inappropriate secretion of antidiuretic hormone: Secondary | ICD-10-CM | POA: Diagnosis present

## 2016-04-27 DIAGNOSIS — R29810 Facial weakness: Secondary | ICD-10-CM | POA: Diagnosis present

## 2016-04-27 DIAGNOSIS — G8929 Other chronic pain: Secondary | ICD-10-CM | POA: Diagnosis not present

## 2016-04-27 DIAGNOSIS — E870 Hyperosmolality and hypernatremia: Secondary | ICD-10-CM | POA: Diagnosis present

## 2016-04-27 DIAGNOSIS — R079 Chest pain, unspecified: Secondary | ICD-10-CM | POA: Diagnosis present

## 2016-04-27 DIAGNOSIS — F411 Generalized anxiety disorder: Secondary | ICD-10-CM | POA: Diagnosis present

## 2016-04-27 DIAGNOSIS — M419 Scoliosis, unspecified: Secondary | ICD-10-CM | POA: Diagnosis present

## 2016-04-27 DIAGNOSIS — R51 Headache: Secondary | ICD-10-CM | POA: Diagnosis present

## 2016-04-27 DIAGNOSIS — I638 Other cerebral infarction: Secondary | ICD-10-CM | POA: Diagnosis not present

## 2016-04-27 DIAGNOSIS — I161 Hypertensive emergency: Secondary | ICD-10-CM | POA: Diagnosis present

## 2016-04-27 DIAGNOSIS — Z884 Allergy status to anesthetic agent status: Secondary | ICD-10-CM | POA: Diagnosis not present

## 2016-04-27 LAB — BASIC METABOLIC PANEL
ANION GAP: 8 (ref 5–15)
ANION GAP: 9 (ref 5–15)
ANION GAP: 9 (ref 5–15)
ANION GAP: 10 (ref 5–15)
BUN: 12 mg/dL (ref 6–20)
BUN: 13 mg/dL (ref 6–20)
BUN: 14 mg/dL (ref 6–20)
BUN: 18 mg/dL (ref 6–20)
CALCIUM: 9.1 mg/dL (ref 8.9–10.3)
CALCIUM: 9.3 mg/dL (ref 8.9–10.3)
CALCIUM: 9.4 mg/dL (ref 8.9–10.3)
CHLORIDE: 99 mmol/L — AB (ref 101–111)
CHLORIDE: 98 mmol/L — AB (ref 101–111)
CO2: 23 mmol/L (ref 22–32)
CO2: 21 mmol/L — AB (ref 22–32)
CO2: 21 mmol/L — AB (ref 22–32)
CO2: 22 mmol/L (ref 22–32)
Calcium: 8.9 mg/dL (ref 8.9–10.3)
Chloride: 98 mmol/L — ABNORMAL LOW (ref 101–111)
Chloride: 96 mmol/L — ABNORMAL LOW (ref 101–111)
Creatinine, Ser: 0.57 mg/dL (ref 0.44–1.00)
Creatinine, Ser: 0.68 mg/dL (ref 0.44–1.00)
Creatinine, Ser: 0.65 mg/dL (ref 0.44–1.00)
Creatinine, Ser: 0.76 mg/dL (ref 0.44–1.00)
GFR calc Af Amer: >60 mL/min (ref >60)
GFR calc Af Amer: >60 mL/min (ref >60)
GFR calc non Af Amer: >60 mL/min (ref >60)
GFR calc non Af Amer: >60 mL/min (ref >60)
GFR calc non Af Amer: >60 mL/min (ref >60)
GLUCOSE: 118 mg/dL — AB (ref 65–99)
GLUCOSE: 159 mg/dL — AB (ref 65–99)
Glucose, Bld: 133 mg/dL — ABNORMAL HIGH (ref 65–99)
Glucose, Bld: 118 mg/dL — ABNORMAL HIGH (ref 65–99)
POTASSIUM: 3.5 mmol/L (ref 3.5–5.1)
POTASSIUM: 3.8 mmol/L (ref 3.5–5.1)
POTASSIUM: 3.8 mmol/L (ref 3.5–5.1)
Potassium: 3.6 mmol/L (ref 3.5–5.1)
Sodium: 130 mmol/L — ABNORMAL LOW (ref 135–145)
Sodium: 128 mmol/L — ABNORMAL LOW (ref 135–145)
Sodium: 128 mmol/L — ABNORMAL LOW (ref 135–145)
Sodium: 128 mmol/L — ABNORMAL LOW (ref 135–145)

## 2016-04-27 LAB — MRSA PCR SCREENING: MRSA by PCR: NEGATIVE

## 2016-04-27 LAB — SODIUM, URINE, RANDOM
SODIUM UR: 121 mmol/L
Sodium, Ur: 117 mmol/L

## 2016-04-27 LAB — ECHOCARDIOGRAM COMPLETE
HEIGHTINCHES: 62 in
WEIGHTICAEL: 1679.02 [oz_av]

## 2016-04-27 LAB — OSMOLALITY, URINE: Osmolality, Ur: 328 mOsm/kg (ref 300–900)

## 2016-04-27 MED ORDER — LORAZEPAM 2 MG/ML IJ SOLN
1.0000 mg | Freq: Once | INTRAMUSCULAR | Status: AC
  Administered 2016-04-27: 1 mg via INTRAVENOUS
  Filled 2016-04-27: qty 1

## 2016-04-27 MED ORDER — FLUOXETINE HCL 20 MG PO CAPS
20.0000 mg | ORAL_CAPSULE | Freq: Every day | ORAL | Status: DC
  Filled 2016-04-27: qty 1

## 2016-04-27 MED ORDER — LORAZEPAM 2 MG/ML IJ SOLN
INTRAMUSCULAR | Status: AC
  Filled 2016-04-27: qty 1

## 2016-04-27 MED ORDER — ACETAMINOPHEN 500 MG PO TABS
1000.0000 mg | ORAL_TABLET | Freq: Four times a day (QID) | ORAL | Status: DC | PRN
  Administered 2016-04-27 – 2016-04-29 (×4): 1000 mg via ORAL
  Filled 2016-04-27 (×4): qty 2

## 2016-04-27 MED ORDER — CITALOPRAM HYDROBROMIDE 10 MG PO TABS
5.0000 mg | ORAL_TABLET | Freq: Every day | ORAL | Status: DC
  Administered 2016-04-27 – 2016-04-29 (×3): 5 mg via ORAL
  Filled 2016-04-27 (×3): qty 1

## 2016-04-27 MED ORDER — LORAZEPAM 2 MG/ML IJ SOLN
1.0000 mg | Freq: Once | INTRAMUSCULAR | Status: AC
  Administered 2016-04-27: 1 mg via INTRAVENOUS

## 2016-04-27 NOTE — Progress Notes (Signed)
PULMONARY / CRITICAL CARE MEDICINE   Name: Patricia Beck MRN: [id number] DOB: 1940-05-11    ADMISSION DATE:  04/26/2016 CONSULTATION DATE:  1/10  REFERRING MD:  Dr. Ree Kida  CHIEF COMPLAINT:  ICH  Brief:   76 year old female with PMH as below, which is significant for HTN, Hypothyroid, anxiety, and hyponatremia. She presented to [facility] ED 1/10 with complaints of chest pain x 2 months. Hypertensive and hyponatremic, new ICH on Head CT. Transferred to [facility] on [address].   SIGNIFICANT EVENTS: 1/10 > ED with complaints of chest pain x 2 months >Hyponatremic/hypertensive > Stepdown > facial droop > ICH > Cardene gtt   SUBJECTIVE:  Off Cardene gtt. Agitation overnight requiring PRN ativan. No distress this morning    VITAL SIGNS: BP 126/66   Pulse 81   Temp 98.4 F (36.9 C) (Oral)   Resp 14   Ht 5\' 2"  (1.575 m)   Wt 47.6 kg (104 lb 15 oz)   SpO2 100%   BMI 19.19 kg/m   HEMODYNAMICS:    VENTILATOR SETTINGS:    INTAKE / OUTPUT: I/O last 3 completed shifts: In: 1727.7 [I.V.:1727.7] Out: 1350 [Urine:1350]  PHYSICAL EXAMINATION: General:  Chronically ill appearing female, lying in bed no distress  Neuro:  Alert, oriented, slowed response with speech, follows commands, moves all extremities  HEENT:  [place]/AT, PERRL, EOM-I and MMM. Cardiovascular:  RRR, Nl S1/S2, No M/R/G. Lungs:  Non-labored, clear breath sounds  Abdomen:  Soft, NT, ND and +BS Musculoskeletal:  -edema and -tenderness. Skin:  Intact.  LABS:  BMET  Recent Labs Lab 04/26/16 1139 04/27/16 0750  NA 123* 130*  K 3.9 3.5  CL 90* 99*  CO2 23 23  BUN 18 12  CREATININE 0.64 0.57  GLUCOSE 128* 118*    Electrolytes  Recent Labs Lab 04/26/16 1139 04/26/16 2052 04/27/16 0750  CALCIUM 8.9  --  8.9  MG  --  2.0  --   PHOS  --  2.4*  --     CBC  Recent Labs Lab 04/26/16 1139  WBC 10.7*  HGB 13.1  HCT 36.9  PLT 302    Coag's No results for input(s): APTT, INR in the last 168  hours.  Sepsis Markers No results for input(s): LATICACIDVEN, PROCALCITON, O2SATVEN in the last 168 hours.  ABG No results for input(s): PHART, PCO2ART, PO2ART in the last 168 hours.  Liver Enzymes No results for input(s): AST, ALT, ALKPHOS, BILITOT, ALBUMIN in the last 168 hours.  Cardiac Enzymes  Recent Labs Lab 04/26/16 2052  TROPONINI <0.03    Glucose  Recent Labs Lab 04/26/16 2128  GLUCAP 150*    Imaging Dg Chest 2 View  Result Date: 04/26/2016 CLINICAL DATA:  Dull chest pain for 2 days. EXAM: CHEST  2 VIEW COMPARISON:  10/29/2014. FINDINGS: The heart size and mediastinal contours are within normal limits. Both lungs are clear. Severe thoracolumbar scoliosis convex RIGHT, unchanged. IMPRESSION: No active cardiopulmonary disease.  Stable exam. Electronically Signed   By: Staci Righter M.D.   On: 04/26/2016 12:06   Ct Head Wo Contrast  Result Date: 04/26/2016 CLINICAL DATA:  Clinical findings consistent with stroke. Changes in speech and right arm over the last few hours. EXAM: CT HEAD WITHOUT CONTRAST TECHNIQUE: Contiguous axial images were obtained from the base of the skull through the vertex without intravenous contrast. COMPARISON:  MRI brain 03/04/2010.  CT head 12/09/2008 FINDINGS: Brain: There is an acute intraparenchymal hematoma in the region of  the left internal capsule measuring 2.2 x 2.3 cm in diameter. Mild surrounding edema. No mass effect or midline shift. Diffuse cerebral atrophy. Mild ventricular dilatation consistent with central atrophy. Low-attenuation changes in the deep white matter consistent with small vessel ischemia. No abnormal extra-axial fluid collections. Gray-white matter junctions are distinct. Basal cisterns are not effaced. Vascular: Diffuse vascular calcifications. The left vertebral artery is densely calcified and ectatic. Appearance is similar to prior studies. Skull: Normal. Negative for fracture or focal lesion. Sinuses/Orbits: No acute  finding. Other: None. IMPRESSION: Acute intraparenchymal hemorrhage in the left internal capsule region with mild surrounding edema. Underlying chronic atrophy and small vessel ischemia. These results were called by telephone at the time of interpretation on 04/26/2016 at 10:03 pm to Dr. Cristal Ford , who verbally acknowledged these results. Electronically Signed   By: Lucienne Capers M.D.   On: 04/26/2016 22:11     STUDIES:  CT head 1/10 > Acute intraparenchymal hemorrhage in the left internal capsule region with mild surrounding edema. Underlying chronic atrophy and small vessel ischemia.  CULTURES:  ANTIBIOTICS:  LINES/TUBES:  ASSESSMENT / PLAN:  NEUROLOGIC A:   Intracranial hemorrhage in the setting of hypertensive emergency Insomnia Anxiety  P:   Management per Neurology/Neurosurgery  Repeat Head CT 10 pm  Frequent Neuro Checks  Tight SBP control as below Continue home Ambien  Continue Prozac   PULMONARY A: No acute issues  P:   Monitor airway protection, at risk intubation Maintain Saturation >92  CARDIOVASCULAR A:  Hypertensive emergency  P:  Telemetry monitoring Nicardipine gtt to maintain systolic [id number] (Currently off)  PRN hydralazine  Continue home amlodipine, bisoprolol  RENAL A:   Hypernatremia -favor SIADH and hypovolemia P:   NS @ 51mL/Hr BMP q 2 hours Fluid restrict  GASTROINTESTINAL A:   No acute issues  P:   NPO  HEMATOLOGIC A:   No acute issues  P:  Follow CBC  INFECTIOUS A:   No acute issues  P:   Follow WBC and fever curve  ENDOCRINE A:   Hypothyroid  P:   Continue Amour thyroid 60 mg qd   FAMILY  - Updates: Patient updated 1/11  - Inter-disciplinary family meet or Palliative Care meeting due by:  1/17   Hayden Pedro, AG-ACNP [facility]  Pgr: [phone]  PCCM Pgr: [phone]  STAFF NOTE: Linwood Dibbles, MD FACP have personally reviewed patient's available data,  including medical history, events of note, physical examination and test results as part of my evaluation. I have discussed with resident/NP and other care providers such as pharmacist, RN and RRT. In addition, I personally evaluated patient and elicited key findings of:  Awake, fc, weak rt hand, stronger leg, scoliosis, CT reviewed, MAP at goals now, can restart cedene if needed, hyponatremia noted, her urine osm is slight inapprop concentrated ( siadh>?) butr she is responding to saline ( favors hypovolemia), maintain saline as improving with this, allow NA to rise, eating okay on own, for MRI , MRA planned, assess echo The patient is critically ill with multiple organ systems failure and requires high complexity decision making for assessment and support, frequent evaluation and titration of therapies, application of advanced monitoring technologies and extensive interpretation of multiple databases.   Critical Care Time devoted to patient care services described in this note is 30 Minutes. This time reflects time of care of this signee: Merrie Roof, MD FACP. This critical care time does not reflect procedure time, or teaching time  or supervisory time of PA/NP/Med student/Med Resident etc but could involve care discussion time. Rest per NP/medical resident whose note is outlined above and that I agree with   Lavon Paganini. Titus Mould, MD, [place] Pgr: [facility] 04/27/2016 10:22 AM

## 2016-04-27 NOTE — Progress Notes (Signed)
STROKE TEAM PROGRESS NOTE   HISTORY OF PRESENT ILLNESS (per record) Patricia Beck is an 76 y.o. female who initially presented to [facility] with chief complaints of increasing back pain and anxiety. On initial evaluation by hospitalist team her chest pain was felt likely to be secondary to anxiety as her troponin was negative and her EKG was unchanged from previous. She was also noted to be hyponatremic with Na of 123, thought to be due to SIADH versus polydipsia; she has been admitted with low sodium in the past; at [facility] this admission she was placed on fluid restriction and plan was to monitor BMP closely. She denied any SOB, diaphoresis, abdominal pain, vomiting, headache, dizziness or nausea on initial evaluation at [facility] on Wednesday 1/11. She was noted to have significantly elevate BP with SBP readings of 156-198 and DBP ranging between 82 and 119.    She was admitted to the stepdown unit with PRN antihypertensives and IVF. While ambulating to her bedside commode, acute onset of right facial droop and RUE weakness was noted. A Code Stroke was called and she was sent for emergent CT, which showed a medium-sized acute left basal ganglia and internal capsule hemorrhage. She was transferred to the [facility] ICU for further management. She has been transferred to the Neurology service from the Hospitalist service with the ICU physician team consulting.    SUBJECTIVE (INTERVAL HISTORY) Her chaplain is at the bedside.  Overall she feels her condition is gradually improving. He is concerned about the disabling anxiety she is experiencing at home. She is sensitive to many medications. Treatment options discussed. She is going to think about what she wants to do. (Of note, Prozac was ordered last night by Dr. Max Fickle)   OBJECTIVE Temp:  [97.8 F (36.6 C)-99 F (37.2 C)] 98.4 F (36.9 C) (01/11 0800) Pulse Rate:  [66-125] 81 (01/11 0715) Cardiac Rhythm: Normal sinus rhythm (01/11 0000) Resp:  [11-32] 14 (01/11  0715) BP: (84-204)/(55-119) 126/66 (01/11 0715) SpO2:  [96 %-100 %] 100 % (01/11 0715) Weight:  [47.6 kg (104 lb 15 oz)-48.8 kg (107 lb 9.4 oz)] 47.6 kg (104 lb 15 oz) (01/11 0500)  CBC:  Recent Labs Lab 04/26/16 1139  WBC 10.7*  HGB 13.1  HCT 36.9  MCV 87.0  PLT 99991111    Basic Metabolic Panel:  Recent Labs Lab 04/26/16 1139 04/26/16 2052 04/27/16 0750  NA 123*  --  130*  K 3.9  --  3.5  CL 90*  --  99*  CO2 23  --  23  GLUCOSE 128*  --  118*  BUN 18  --  12  CREATININE 0.64  --  0.57  CALCIUM 8.9  --  8.9  MG  --  2.0  --   PHOS  --  2.4*  --     Lipid Panel:    Component Value Date/Time   CHOL (H) 12/09/2008 2115    265        ATP III CLASSIFICATION:  <200     mg/dL   Desirable  200-239  mg/dL   Borderline High  >=240    mg/dL   High          TRIG 179 (H) 12/09/2008 2115   HDL 39 (L) 12/09/2008 2115   CHOLHDL 6.8 12/09/2008 2115   VLDL 36 12/09/2008 2115   LDLCALC (H) 12/09/2008 2115    190        Total Cholesterol/HDL:CHD Risk Coronary Heart Disease Risk Table  Men   Women  1/2 Average Risk   3.4   3.3  Average Risk       5.0   4.4  2 X Average Risk   9.6   7.1  3 X Average Risk  23.4   11.0        Use the calculated Patient Ratio above and the CHD Risk Table to determine the patient's CHD Risk.        ATP III CLASSIFICATION (LDL):  <100     mg/dL   Optimal  100-129  mg/dL   Near or Above                    Optimal  130-159  mg/dL   Borderline  160-189  mg/dL   High  >190     mg/dL   Very High   HgbA1c:  Lab Results  Component Value Date   HGBA1C (H) 12/09/2008    6.3 (NOTE) The ADA recommends the following therapeutic goal for glycemic control related to Hgb A1c measurement: Goal of therapy: <6.5 Hgb A1c  Reference: American Diabetes Association: Clinical Practice Recommendations 2010, Diabetes Care, 2010, 33: (Suppl  1).    IMAGING  Dg Chest 2 View  Result Date: 04/26/2016 CLINICAL DATA:  Dull chest pain for 2  days. EXAM: CHEST  2 VIEW COMPARISON:  10/29/2014. FINDINGS: The heart size and mediastinal contours are within normal limits. Both lungs are clear. Severe thoracolumbar scoliosis convex RIGHT, unchanged. IMPRESSION: No active cardiopulmonary disease.  Stable exam. Electronically Signed   By: Staci Righter M.D.   On: 04/26/2016 12:06   Ct Head Wo Contrast  Result Date: 04/26/2016 CLINICAL DATA:  Clinical findings consistent with stroke. Changes in speech and right arm over the last few hours. EXAM: CT HEAD WITHOUT CONTRAST TECHNIQUE: Contiguous axial images were obtained from the base of the skull through the vertex without intravenous contrast. COMPARISON:  MRI brain 03/04/2010.  CT head 12/09/2008 FINDINGS: Brain: There is an acute intraparenchymal hematoma in the region of the left internal capsule measuring 2.2 x 2.3 cm in diameter. Mild surrounding edema. No mass effect or midline shift. Diffuse cerebral atrophy. Mild ventricular dilatation consistent with central atrophy. Low-attenuation changes in the deep white matter consistent with small vessel ischemia. No abnormal extra-axial fluid collections. Gray-white matter junctions are distinct. Basal cisterns are not effaced. Vascular: Diffuse vascular calcifications. The left vertebral artery is densely calcified and ectatic. Appearance is similar to prior studies. Skull: Normal. Negative for fracture or focal lesion. Sinuses/Orbits: No acute finding. Other: None. IMPRESSION: Acute intraparenchymal hemorrhage in the left internal capsule region with mild surrounding edema. Underlying chronic atrophy and small vessel ischemia. These results were called by telephone at the time of interpretation on 04/26/2016 at 10:03 pm to Dr. Cristal Ford , who verbally acknowledged these results. Electronically Signed   By: Lucienne Capers M.D.   On: 04/26/2016 22:11    PHYSICAL EXAM Frail petite elderly Caucasian lady not in distress. Anxious. . Afebrile. Head is  nontraumatic. Neck is supple without bruit.    Cardiac exam no murmur or gallop. Lungs are clear to auscultation. Distal pulses are well felt. Neurological Exam ;  Awake  Alert oriented x 3. Mildly dysarthric speech Fundi not visualized.eye movements full without nystagmus.fundi were not visualized. Vision acuity and fields appear normal. Hearing is normal. Palatal movements are normal. Face asymmetric with mild right lower facial weakness.. Tongue midline. Normal strength, tone, reflexes and coordination except for  mild right lower extremity drift. Diminished fine finger movements on the right. Orbits left over right upper extremity.. Normal sensation. Gait deferred.  ASSESSMENT/PLAN Patricia Beck is a 76 y.o. female who was admitted to [facility] with increasing back pain, anxiety, and hyponatremia who developed right sided weakness while ambulating to the bedside commode 04/26/2016. CT showed left basal ganglia/internal capsule hemorrhage.   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 Yes; Fluid consistency: Thin  No antithrombotic prior to admission  Ongoing aggressive stroke risk factor management  Therapy recommendations:  PT, OT and SLP cognition added  Monitor in ICU 24 hours  Disposition:  pending   Hypertensive emergency   Blood pressure as high as 204/101 in setting of neurologic symptoms   Treated with Cardene drip   Blood pressure improved today  Systolic blood pressure goal less than 160   O medications : Norvasc 5, Zebeta 5   Long-term BP goal normotensive  Other Stroke Risk Factors  Advanced age  Other Active Problems  Anxiety, increased at home per son. Sensitive to lots of meds.  Has only tried zoloft which made her dizzy. Does not want xanax. Dizzy on minimal doses. Discussed options. She is agreeable to start low dose - will start celexa 5 mg daily while hospitalized. Stop prozac ordered last night.  Hypothyroid, on Synthroid  Hyponatremia, favor SIADH hypovolemia 128  Chest pain 2 months  Hospital day # 0  Radene Journey [facility] for Pager information 04/27/2016 4:18 PM  I have personally examined this patient, reviewed notes, independently viewed imaging studies, participated in medical decision making and plan of care.ROS completed by me personally and pertinent positives fully documented  I have made any additions or clarifications directly to the above note. Agree with note above. She presented with right hemiparesis secondary to left basal ganglia hemorrhage due to uncontrolled hypertension. Recommend close neurological monitoring and ICU with strict blood pressure control. Check MRI scan the brain with MRA later today. Start oral blood pressure medications. Patient counseled to be compliant with blood pressure medicines. This patient is critically ill and at significant risk of neurological worsening, death and care requires constant monitoring of vital signs, hemodynamics,respiratory and cardiac monitoring, extensive review of multiple databases, frequent neurological assessment, discussion with family, other specialists and medical decision making of high complexity.I have made any additions or clarifications directly to the above note.This critical care time does not reflect procedure time, or teaching time or supervisory time of PA/NP/Med Resident etc but could involve care discussion time.  I spent 30 minutes of neurocritical care time  in the care of  this patient.      Antony Contras, MD Medical Director [facility] Pager: [phone] 04/27/2016 4:32 PM  To contact Stroke Continuity provider, please refer to  [email or web address] After hours, contact General Neurology

## 2016-04-27 NOTE — H&P (Signed)
Acceptance Note    Chief Complaint: Acute onset of right sided weakness at [facility]  HPI: Patricia Beck is an 76 y.o. female who initially presented to [facility] with chief complaints of increasing back pain and anxiety. On initial evaluation by hospitalist team her chest pain was felt likely to be secondary to anxiety as her troponin was negative and her EKG was unchanged from previous. 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 She denied any SOB, diaphoresis, abdominal pain, vomiting, headache, dizziness or nausea on initial evaluation at [facility] on Wednesday 1/11. She was noted to have significantly elevate BP with SBP readings of 156-198 and DBP ranging between 82 and 119.    She was admitted to the stepdown unit with PRN antihypertensives and IVF. While ambulating to her bedside commode, acute onset of right facial droop and RUE weakness was noted. A Code Stroke was called and she was sent for emergent CT, which showed a medium-sized acute left basal ganglia and internal capsule hemorrhage. She was transferred to the [facility] ICU for further management. She has been transferred to the Neurology service from the Hospitalist service with the ICU physician team consulting.   Past Medical History:  Diagnosis Date  . Cataract   . Fibroma 2006  . Hypertension   . Thyroid disease     Past Surgical History:  Procedure Laterality Date  . EYE SURGERY    . fibroma      Family History  Problem Relation Age of Onset  . Diabetes Father    Social History:  reports that she has never smoked. She has never used smokeless tobacco. She reports that she does not drink alcohol or use drugs.  Allergies:  Allergies  Allergen Reactions  . Aciphex [Rabeprazole Sodium]   . Aleve [Naproxen Sodium]   . Atarax  [Hydroxyzine]   . Belsomra [Suvorexant] Other (See Comments)    Nightmares  . Buspirone   . Codeine   . Erythromycin   . Keflex [Cephalexin]   . Lidocaine   . Prednisone     Insomnia, nausea, hot flush   . Tramadol   . Zoloft [Sertraline Hcl]     Medications Prior to Admission  Medication Sig Dispense Refill  . acetaminophen (TYLENOL) 500 MG tablet Take 1,000 mg by mouth every 6 (six) hours as needed for moderate pain (pain).     Marland Kitchen amLODipine (NORVASC) 5 MG tablet Take 5 mg by mouth daily.    . Ascorbic Acid (VITAMIN C) 1000 MG tablet Take 1,000 mg by mouth daily.    . bisoprolol (ZEBETA) 5 MG tablet Take 5 mg by mouth daily.    . calcium citrate (CALCITRATE - DOSED IN MG ELEMENTAL CALCIUM) 950 MG tablet Take 200 mg of elemental calcium by mouth daily.    . cholecalciferol (VITAMIN D) 1000 UNITS tablet Take 5,000 Units by mouth daily.    . Melatonin 3 MG TABS Take 15-18 mg by mouth at bedtime.    Marland Kitchen thyroid (ARMOUR) 65 MG tablet Take 65 mg by mouth daily before breakfast.     . vitamin E 100 UNIT capsule Take by mouth daily.    Marland Kitchen zolpidem (AMBIEN) 5 MG tablet Take 1 tablet (5 mg total) by mouth at bedtime as needed for sleep.  15 tablet 0    ROS:  Has a mild headache as well as insomnia and anxiety. Other ROS as per HPI.   Physical Examination: Blood pressure (!) 164/80, pulse 99, temperature 99 F (37.2 C), temperature source Oral, resp. rate 18, height '5\' 2"'$  (1.575 m), weight 47.6 kg (104 lb 15 oz), SpO2 98 %.  HEENT-  Normocephalic/atraumatic.  Lungs - No gross wheezing. Respirations unlabored.  Extremities - No edema.   Neurologic Examination: Ment: Alert and fully oriented. Pleasant and cooperative in context of mildly anxious affect. Speech is dysarthric and at times garbled/agrammatical. At other times able to form normal sentences. Able to follow all commands. Naming intact. Repetition intact.  CN: PERRL. Visual fields intact. Right palpebral fissure wider than left.  EOMI without nystagmus. Facial sensory intact to temp bilaterally. Right facial droop. Hearing intact to conversation. Mild hypophonia. XI - lag on right. Tongue protrudes midline.  Motor: RUE: Lag in movement. Decreased tone. 4-/5 strength proximal and distal.  RLE: 4/5 with lag in movement. LUE and LLE 5/5.  Sensory:Perceives lightl tactile stimuli all 4 extremities.  Reflexes: Right biceps/brachioradialis 2+. Left biceps/brachioradialis 3+. Crossed adductor reflexes patellae bilaterally. Achilles 2+ bilaterally.  Cerebellar: No ataxia with left FNF. Dysmetric and bradykinetic with right FNF. Gait: Deferred.   Results for orders placed or performed during the hospital encounter of 04/26/16 (from the past 48 hour(s))  Basic metabolic panel     Status: Abnormal   Collection Time: 04/26/16 11:39 AM  Result Value Ref Range   Sodium 123 (L) 135 - 145 mmol/L   Potassium 3.9 3.5 - 5.1 mmol/L   Chloride 90 (L) 101 - 111 mmol/L   CO2 23 22 - 32 mmol/L   Glucose, Bld 128 (H) 65 - 99 mg/dL   BUN 18 6 - 20 mg/dL   Creatinine, Ser 0.64 0.44 - 1.00 mg/dL   Calcium 8.9 8.9 - 10.3 mg/dL   GFR calc non Af Amer >60 >60 mL/min   GFR calc Af Amer >60 >60 mL/min    Comment: (NOTE) The eGFR has been calculated using the CKD EPI equation. This calculation has not been validated in all clinical situations. eGFR's persistently <60 mL/min signify possible Chronic Kidney Disease.    Anion gap 10 5 - 15  CBC     Status: Abnormal   Collection Time: 04/26/16 11:39 AM  Result Value Ref Range   WBC 10.7 (H) 4.0 - 10.5 K/uL   RBC 4.24 3.87 - 5.11 MIL/uL   Hemoglobin 13.1 12.0 - 15.0 g/dL   HCT 36.9 36.0 - 46.0 %   MCV 87.0 78.0 - 100.0 fL   MCH 30.9 26.0 - 34.0 pg   MCHC 35.5 30.0 - 36.0 g/dL   RDW 13.3 11.5 - 15.5 %   Platelets 302 150 - 400 K/uL  I-stat troponin, ED     Status: None   Collection Time: 04/26/16 11:54 AM  Result Value Ref Range   Troponin i, poc 0.00 0.00 - 0.08 ng/mL   Comment 3             Comment: Due to the release kinetics of cTnI, a negative result within the first hours of the onset of symptoms does not rule out myocardial infarction with certainty. If myocardial infarction is still suspected, repeat the test at appropriate intervals.   Urinalysis, Routine w reflex microscopic     Status: Abnormal   Collection Time: 04/26/16  5:02 PM  Result Value Ref Range  Color, Urine COLORLESS (A) YELLOW   APPearance CLEAR CLEAR   Specific Gravity, Urine 1.004 (L) 1.005 - 1.030   pH 9.0 (H) 5.0 - 8.0   Glucose, UA NEGATIVE NEGATIVE mg/dL   Hgb urine dipstick NEGATIVE NEGATIVE   Bilirubin Urine NEGATIVE NEGATIVE   Ketones, ur NEGATIVE NEGATIVE mg/dL   Protein, ur NEGATIVE NEGATIVE mg/dL   Nitrite NEGATIVE NEGATIVE   Leukocytes, UA NEGATIVE NEGATIVE  TSH     Status: Abnormal   Collection Time: 04/26/16  8:52 PM  Result Value Ref Range   TSH 5.293 (H) 0.350 - 4.500 uIU/mL    Comment: Performed by a 3rd Generation assay with a functional sensitivity of <=0.01 uIU/mL.  Troponin I (q 6hr x 3)     Status: None   Collection Time: 04/26/16  8:52 PM  Result Value Ref Range   Troponin I <0.03 <0.03 ng/mL  Magnesium     Status: None   Collection Time: 04/26/16  8:52 PM  Result Value Ref Range   Magnesium 2.0 1.7 - 2.4 mg/dL  Phosphorus     Status: Abnormal   Collection Time: 04/26/16  8:52 PM  Result Value Ref Range   Phosphorus 2.4 (L) 2.5 - 4.6 mg/dL  Osmolality     Status: Abnormal   Collection Time: 04/26/16  8:52 PM  Result Value Ref Range   Osmolality 272 (L) 275 - 295 mOsm/kg    Comment: Performed at [facility]  Cortisol-am, blood     Status: None   Collection Time: 04/26/16  8:52 PM  Result Value Ref Range   Cortisol - AM 19.0 6.7 - 22.6 ug/dL    Comment: Performed at [facility]  Glucose, capillary     Status: Abnormal   Collection Time: 04/26/16  9:28 PM  Result Value Ref Range   Glucose-Capillary 150 (H) 65 - 99 mg/dL   Dg Chest 2  View  Result Date: 04/26/2016 CLINICAL DATA:  Dull chest pain for 2 days. EXAM: CHEST  2 VIEW COMPARISON:  10/29/2014. FINDINGS: The heart size and mediastinal contours are within normal limits. Both lungs are clear. Severe thoracolumbar scoliosis convex RIGHT, unchanged. IMPRESSION: No active cardiopulmonary disease.  Stable exam. Electronically Signed   By: Elsie Stain M.D.   On: 04/26/2016 12:06   Ct Head Wo Contrast  Result Date: 04/26/2016 CLINICAL DATA:  Clinical findings consistent with stroke. Changes in speech and right arm over the last few hours. EXAM: CT HEAD WITHOUT CONTRAST TECHNIQUE: Contiguous axial images were obtained from the base of the skull through the vertex without intravenous contrast. COMPARISON:  MRI brain 03/04/2010.  CT head 12/09/2008 FINDINGS: Brain: There is an acute intraparenchymal hematoma in the region of the left internal capsule measuring 2.2 x 2.3 cm in diameter. Mild surrounding edema. No mass effect or midline shift. Diffuse cerebral atrophy. Mild ventricular dilatation consistent with central atrophy. Low-attenuation changes in the deep white matter consistent with small vessel ischemia. No abnormal extra-axial fluid collections. Gray-white matter junctions are distinct. Basal cisterns are not effaced. Vascular: Diffuse vascular calcifications. The left vertebral artery is densely calcified and ectatic. Appearance is similar to prior studies. Skull: Normal. Negative for fracture or focal lesion. Sinuses/Orbits: No acute finding. Other: None. IMPRESSION: Acute intraparenchymal hemorrhage in the left internal capsule region with mild surrounding edema. Underlying chronic atrophy and small vessel ischemia. These results were called by telephone at the time of interpretation on 04/26/2016 at 10:03 pm to Dr. Edsel Petrin , who  verbally acknowledged these results. Electronically Signed   By: Lucienne Capers M.D.   On: 04/26/2016 22:11   Assessment: 75 year old female  with acute, medium-sized left basal ganglia and internal capsule hemorrhage 1. ICH in the setting of hypertensive emergency.  2. Hyponatremia. 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  3. Multiple drug allergies.  4. Hypothyroidism. TSH mildly elevated at 5.3. 5. Chronic anxiety. Will have follow up with a psychiatrist in February. Has been difficult to treat due to many drug intolerances and patient's fear of becoming addicted to medications.  6. Chest pain. Felt secondary to anxiety. EKG was unchanged from prior. Cycling troponins.   Plan: 1. SBP goal of < 160 with PRN hydralazine and nicardipine gtt. 2. Gradually correct serum Na by no greater than 0.5 meq/L/hr, not to exceed a change of 10 meq/L per 24 hour period. Currently on 75 cc/hr NS with BMP checks q2h. If trend of Na levels is gradual and slow after several q2h checks, will change Na lab interval to q4h.  3. PO fluid restriction. 4. Continuing home bisoprolol. 5. Stroke team to consider restarting home dosage of amlodipine in the morning.  6. Repeat CT at 10 PM Thursday (this evening).  7. Frequent neuro checks.  8.  No antiplatelet medications or anticoagulants. DVT prophylaxis with SCDs. 9. MRI brain with and without contrast when stable.   10. Continue Armour thyroid tablet 60 mg po qd. May need to be titrated up slightly.  11. On Ambien for insomnia.  12. Also being followed by ICU team. We appreciate their input.  13. 1 mg Ativan IV x 1 for anxiety and insomnia. Ambien dose tonight did not have therapeutic effect.  14. Start Prozac 20 mg po qd for anxiety. Benefit outweighs risk of side effect (prior symptoms of feeling off balance with Zoloft).   Electronically signed: Dr. Kerney Elbe 04/27/2016, 12:03 AM

## 2016-04-27 NOTE — Progress Notes (Signed)
Upon housekeeping cleaning patients room, they found patients white straight cane in the corner of the room. Patient belongings tag attached to cane. TK, RN at [facility] neuro unit is patients nurse and I spoke with him and he stated he would let the patient know her cane is at [facility]. I will call patients daughter and inform her that her mothers cane was left at [facility].

## 2016-04-27 NOTE — Progress Notes (Signed)
  Echocardiogram 2D Echocardiogram has been performed.  Diamond Nickel 04/27/2016, 11:51 AM

## 2016-04-27 NOTE — Progress Notes (Signed)
Patients daughter was called, did not answer, voicemail left explaining that patients cane was at [employer] with security and to call [facility] to speak with the nurse there if any questions.

## 2016-04-27 NOTE — Progress Notes (Signed)
Pt having trouble sleeping.  RN pulled Ambien and scanned at Patricia Beck.  Pt said she did not swallow the Ambien and that the medication cup was empty.  RN watched her put cup in mouth and did not see med fall out.  Search room and bed with another RN .  Pt seem to become increasingly agitated and restless. Charge nurse notify.  Situation explained to MD , one dose of ativan order.  Ativan given through Iv.

## 2016-04-27 NOTE — Progress Notes (Signed)
Shift event note:  Notified by RN regarding changes in pt's neurological status. While attempting to assist pt to [facility] RN noted pt was leaning to the right w/ associated (R) sided weakness. Sppech also was lurred and pt appeared to have a (R) facial droop. Pt reports to RN that she has been aware of some changes x 2 hours. RR RN was paged and responded to bedside. RR RN has confirmed (R) sided weakness, facial droop and slurred speech. A code stroke was initiated, neurology was notified and pt was taken to for ct head w/o cm. Pt then transferred to room 1228. Upon assessment at bedside pt noted w/ (R) facial droop and slurred speech as well as some level of expressive aphagia. RUE and RLE 4/5 strength. NIH scale 7 per RN. Ct reveals (L) ICH measuring 2.2 x 2.3 cm in diameter. BP 204/101, HR-92, RR-22 w/ 02 sats of 99% on r/a. Pt is afebrile. Pt was supposed to receive her first dose of Lovenox tonight but this dose has been held.  Assessment/Plan: 1. (L) ICH: In setting of accelerated HTN. Discussed pt w/ Dr Cheral Marker w/ neurology service who has reviewed CT and  Recommends pt be transferred to [facility] neuro ICU. He has requested management of BP w/ Cardene qtt w/ SBP goal of 160 or less. Confirmation was received regarding available bed in neuro ICU at [facility].  Discussed pt w/ Dr Oletta Darter w/ [facility] who has agreed to have pt evaluated by CCM provider upon arrival at [place]. CCM to assist w/ BP management. Ct findings and plan disucced w/ pt and she is agreeable to transfer to [facility]. My colleagues Dr Hal Hope (transferring MD) and Dr Alcario Drought (receiving MD) were notified regarding pt's pending transfer. Will continue to monitor closely pending tx to [facility] neuro ICU.  Jeryl Columbia, NP-C [facility] Pager [phone]

## 2016-04-28 ENCOUNTER — Inpatient Hospital Stay (HOSPITAL_COMMUNITY): Payer: Medicare Other

## 2016-04-28 DIAGNOSIS — I610 Nontraumatic intracerebral hemorrhage in hemisphere, subcortical: Secondary | ICD-10-CM

## 2016-04-28 DIAGNOSIS — I10 Essential (primary) hypertension: Secondary | ICD-10-CM

## 2016-04-28 LAB — CBC
HEMATOCRIT: 38.6 % (ref 36.0–46.0)
HEMOGLOBIN: 13.5 g/dL (ref 12.0–15.0)
MCH: 30.9 pg (ref 26.0–34.0)
MCHC: 35 g/dL (ref 30.0–36.0)
MCV: 88.3 fL (ref 78.0–100.0)
Platelets: 290 10*3/uL (ref 150–400)
RBC: 4.37 MIL/uL (ref 3.87–5.11)
RDW: 13.3 % (ref 11.5–15.5)
WBC: 9.8 10*3/uL (ref 4.0–10.5)

## 2016-04-28 LAB — LIPID PANEL
CHOL/HDL RATIO: 3.4 ratio
Cholesterol: 205 mg/dL — ABNORMAL HIGH (ref 0–200)
HDL: 60 mg/dL (ref >40)
LDL CALC: 131 mg/dL — AB (ref 0–99)
TRIGLYCERIDES: 72 mg/dL (ref <150)
VLDL: 14 mg/dL (ref 0–40)

## 2016-04-28 LAB — MAGNESIUM: MAGNESIUM: 1.9 mg/dL (ref 1.7–2.4)

## 2016-04-28 LAB — PHOSPHORUS: Phosphorus: 2.7 mg/dL (ref 2.5–4.6)

## 2016-04-28 MED ORDER — TRAMADOL HCL 50 MG PO TABS
50.0000 mg | ORAL_TABLET | Freq: Once | ORAL | Status: AC
  Administered 2016-04-28: 50 mg via ORAL
  Filled 2016-04-28: qty 1

## 2016-04-28 MED ORDER — PRAVASTATIN SODIUM 40 MG PO TABS
40.0000 mg | ORAL_TABLET | Freq: Every day | ORAL | Status: DC
  Filled 2016-04-28: qty 1

## 2016-04-28 MED ORDER — ALPRAZOLAM 0.25 MG PO TABS
0.2500 mg | ORAL_TABLET | Freq: Three times a day (TID) | ORAL | Status: DC | PRN
  Administered 2016-04-28 – 2016-04-30 (×4): 0.25 mg via ORAL
  Filled 2016-04-28 (×4): qty 1

## 2016-04-28 NOTE — Progress Notes (Signed)
PULMONARY / CRITICAL CARE MEDICINE   Name: Patricia Beck MRN: [id number] DOB: 1940/07/10    ADMISSION DATE:  04/26/2016 CONSULTATION DATE:  1/10  REFERRING MD:  Dr. Ree Kida  CHIEF COMPLAINT:  ICH  Brief:   76 year old female with PMH as below, which is significant for HTN, Hypothyroid, anxiety, and hyponatremia. She presented to [facility] ED 1/10 with complaints of chest pain x 2 months. Hypertensive and hyponatremic, new ICH on Head CT. Transferred to [facility] on [address].   SIGNIFICANT EVENTS: 1/10 > ED with complaints of chest pain x 2 months >Hyponatremic/hypertensive > Stepdown > facial droop > ICH > Cardene gtt   SUBJECTIVE:  Requiring In and Out cath for urinary retention.   VITAL SIGNS: BP 140/76   Pulse (!) 107   Temp 97.9 F (36.6 C) (Axillary)   Resp (!) 22   Ht 5\' 2"  (1.575 m)   Wt 47.7 kg (105 lb 2.6 oz)   SpO2 99%   BMI 19.23 kg/m   HEMODYNAMICS:    VENTILATOR SETTINGS:    INTAKE / OUTPUT: I/O last 3 completed shifts: In: 3843.7 [I.V.:3843.7] Out: 2750 [Urine:2750]  PHYSICAL EXAMINATION: General:  Adult female, in bed, no distress  Neuro:  Alert, oriented, follows commands, moves all extremities  HEENT:  Normocephalic  Cardiovascular:  RRR, Nl S1/S2, No M/R/G. Lungs:  Clear breath sounds bilaterally  Abdomen:  Soft, NT, ND and +BS Musculoskeletal:  No deformities  Skin:  Warm, dry, Intact.  LABS:  BMET  Recent Labs Lab 04/27/16 1123 04/27/16 1421 04/27/16 1630  NA 128* 128* 128*  K 3.6 3.8 3.8  CL 98* 98* 96*  CO2 21* 21* 22  BUN 13 14 18   CREATININE 0.68 0.65 0.76  GLUCOSE 159* 133* 118*    Electrolytes  Recent Labs Lab 04/26/16 2052  04/27/16 1123 04/27/16 1421 04/27/16 1630 04/28/16 0336  CALCIUM  --   < > 9.1 9.3 9.4  --   MG 2.0  --   --   --   --  1.9  PHOS 2.4*  --   --   --   --  2.7  < > = values in this interval not displayed.  CBC  Recent Labs Lab 04/26/16 1139 04/28/16 0336  WBC 10.7* 9.8  HGB 13.1  13.5  HCT 36.9 38.6  PLT 302 290    Coag's No results for input(s): APTT, INR in the last 168 hours.  Sepsis Markers No results for input(s): LATICACIDVEN, PROCALCITON, O2SATVEN in the last 168 hours.  ABG No results for input(s): PHART, PCO2ART, PO2ART in the last 168 hours.  Liver Enzymes No results for input(s): AST, ALT, ALKPHOS, BILITOT, ALBUMIN in the last 168 hours.  Cardiac Enzymes  Recent Labs Lab 04/26/16 2052  TROPONINI <0.03    Glucose  Recent Labs Lab 04/26/16 2128  GLUCAP 150*    Imaging Mr [facility] Wo Contrast  Result Date: 04/27/2016 CLINICAL DATA:  Continued surveillance intraparenchymal hemorrhage. RIGHT-sided weakness. EXAM: MRI HEAD WITHOUT CONTRAST MRA HEAD WITHOUT CONTRAST TECHNIQUE: Multiplanar, multiecho pulse sequences of the brain and surrounding structures were obtained without intravenous contrast. Angiographic images of the head were obtained using MRA technique without contrast. COMPARISON:  CT head 04/26/2016. FINDINGS: MRI HEAD FINDINGS Brain: Acute intraparenchymal basal ganglia hemorrhage, epicenter posterior lentiform nucleus and internal capsule on the LEFT, markedly T2 hypointense consistent with deoxyhemoglobin, correlating with CT appearance, having cross-sectional measurements of 24 x 29 x 30 mm (R-L x A-P  x C-C), corresponding to a volume of 10.5 mL. Mild surrounding edema. No midline shift. Normal for age cerebral volume. Extensive focal and confluent T2 and FLAIR hyperintensities throughout the white matter, likely chronic microvascular ischemic change. Gradient sequence demonstrates numerous areas of susceptibility (greater than 10) throughout the deep white matter, deep nuclei, brainstem, vermis, and cerebellar hemispheres consistent with microbleeds related to chronic hypertensive cerebrovascular disease. Vascular: Heavily calcified and dolichoectatic distal LEFT vertebral demonstrates susceptibility due to mineralization representing  heavy calcification along its V4 segment. Flow voids are maintained throughout. Skull and upper cervical spine: Unremarkable. Sinuses/Orbits: No layering sinus or mastoid fluid. Other: None.  Compared with yesterday's CT, similar appearance. MRA HEAD FINDINGS Dolichoectatic but widely patent internal carotid arteries. Slight BILATERAL cavernous irregularity and outpouchings representing non stenotic atheromatous change. No proximal anterior or middle cerebral artery stenosis or dissection. Dolichoectatic basilar artery swings to the RIGHT. Dolichoectatic distal LEFT vertebral artery mildly dilated without evidence for dissection. RIGHT vertebral artery is diminutive, contributes only to PICA. Slight irregularity distal MCA and PCA branches consistent with intracranial atherosclerotic change. Fetal origin LEFT PCA. No saccular aneurysm. No vascular malformation. IMPRESSION: Acute intraparenchymal hemorrhage as identified on CT, posterior LEFT lentiform nucleus and internal capsule, measuring 24 x 29 x 30 mm corresponding to a volume of 10.5 mL. Mild surrounding AVM without midline shift. Widespread chronic microbleeds throughout the supratentorial and infratentorial compartment, along with extensive focal and confluent ischemic demyelination consistent with longstanding chronic hypertensive cerebrovascular disease. No intracranial vascular malformation, proximal stenosis, or dissection. Electronically Signed   By: Staci Righter M.D.   On: 04/27/2016 16:23   Mr Brain Wo Contrast  Result Date: 04/27/2016 CLINICAL DATA:  Continued surveillance intraparenchymal hemorrhage. RIGHT-sided weakness. EXAM: MRI HEAD WITHOUT CONTRAST MRA HEAD WITHOUT CONTRAST TECHNIQUE: Multiplanar, multiecho pulse sequences of the brain and surrounding structures were obtained without intravenous contrast. Angiographic images of the head were obtained using MRA technique without contrast. COMPARISON:  CT head 04/26/2016. FINDINGS: MRI HEAD  FINDINGS Brain: Acute intraparenchymal basal ganglia hemorrhage, epicenter posterior lentiform nucleus and internal capsule on the LEFT, markedly T2 hypointense consistent with deoxyhemoglobin, correlating with CT appearance, having cross-sectional measurements of 24 x 29 x 30 mm (R-L x A-P x C-C), corresponding to a volume of 10.5 mL. Mild surrounding edema. No midline shift. Normal for age cerebral volume. Extensive focal and confluent T2 and FLAIR hyperintensities throughout the white matter, likely chronic microvascular ischemic change. Gradient sequence demonstrates numerous areas of susceptibility (greater than 10) throughout the deep white matter, deep nuclei, brainstem, vermis, and cerebellar hemispheres consistent with microbleeds related to chronic hypertensive cerebrovascular disease. Vascular: Heavily calcified and dolichoectatic distal LEFT vertebral demonstrates susceptibility due to mineralization representing heavy calcification along its V4 segment. Flow voids are maintained throughout. Skull and upper cervical spine: Unremarkable. Sinuses/Orbits: No layering sinus or mastoid fluid. Other: None.  Compared with yesterday's CT, similar appearance. MRA HEAD FINDINGS Dolichoectatic but widely patent internal carotid arteries. Slight BILATERAL cavernous irregularity and outpouchings representing non stenotic atheromatous change. No proximal anterior or middle cerebral artery stenosis or dissection. Dolichoectatic basilar artery swings to the RIGHT. Dolichoectatic distal LEFT vertebral artery mildly dilated without evidence for dissection. RIGHT vertebral artery is diminutive, contributes only to PICA. Slight irregularity distal MCA and PCA branches consistent with intracranial atherosclerotic change. Fetal origin LEFT PCA. No saccular aneurysm. No vascular malformation. IMPRESSION: Acute intraparenchymal hemorrhage as identified on CT, posterior LEFT lentiform nucleus and internal capsule, measuring 24 x  29 x 30 mm  corresponding to a volume of 10.5 mL. Mild surrounding AVM without midline shift. Widespread chronic microbleeds throughout the supratentorial and infratentorial compartment, along with extensive focal and confluent ischemic demyelination consistent with longstanding chronic hypertensive cerebrovascular disease. No intracranial vascular malformation, proximal stenosis, or dissection. Electronically Signed   By: Staci Righter M.D.   On: 04/27/2016 16:23     STUDIES:  CT head 1/10 > Acute intraparenchymal hemorrhage in the left internal capsule region with mild surrounding edema. Underlying chronic atrophy and small vessel ischemia.   ASSESSMENT / PLAN:  NEUROLOGIC A:   Intracranial hemorrhage in the setting of hypertensive emergency Insomnia Anxiety  P:   Management per Neurology/Neurosurgery  Tight SBP control as below Continue Celexa  Continue Prozac   PULMONARY A: No acute issues  P:   Maintain Saturation >92  CARDIOVASCULAR A:  Hypertensive emergency  P:  Telemetry monitoring PRN hydralazine  Continue home amlodipine, bisoprolol  RENAL A:   Hypernatremia -favor SIADH and hypovolemia P:   NS @ 25mL/Hr Fluid restrict  GASTROINTESTINAL A:   Urinary Retention  P:   Place foley cath   HEMATOLOGIC A:   No acute issues P:  Follow CBC  INFECTIOUS A:   No acute issues P:   Follow WBC and fever curve  ENDOCRINE A:   Hypothyroid P:   Continue Amour thyroid 60 mg qd   FAMILY  - Updates: Patient updated 1/11  - Inter-disciplinary family meet or Palliative Care meeting due by:  1/17  Transferring to floor, will sign off to [facility] for 1/13.    Hayden Pedro, AG-ACNP [facility]  Pgr: [phone]  PCCM Pgr: [phone]  Attending Note:  I have examined patient, reviewed labs, studies and notes. I have discussed the case with Beaulah Corin, and I agree with the data and plans as amended above. 76 yo woman with HTN,  hyponatremia, admitted with hypertensive emergency and SDH. She required rx with nicardipine gtt, now off and controlled on amlodipine and bisoprolol. She has stable hyponatremia. On eval she is awake, interacting, in no distress. Clear lungs. She can transition out of ICU. Will ask [facility] to consult for medical care. Follow her BMP and Na level.   Baltazar Apo, MD, PhD 04/28/2016, 2:24 PM [facility] [phone] or if no answer [phone]

## 2016-04-28 NOTE — Progress Notes (Signed)
STROKE TEAM PROGRESS NOTE   HISTORY OF PRESENT ILLNESS (per record) Patricia Beck is an 76 y.o. female who initially presented to [facility] with chief complaints of increasing back pain and anxiety. On initial evaluation by hospitalist team her chest pain was felt likely to be secondary to anxiety as her troponin was negative and her EKG was unchanged from previous. 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 She denied any SOB, diaphoresis, abdominal pain, vomiting, headache, dizziness or nausea on initial evaluation at [facility] on Wednesday 1/11. She was noted to have significantly elevate BP with SBP readings of 156-198 and DBP ranging between 82 and 119.    She was admitted to the stepdown unit with PRN antihypertensives and IVF. While ambulating to her bedside commode, acute onset of right facial droop and RUE weakness was noted. A Code Stroke was called and she was sent for emergent CT, which showed a medium-sized acute left basal ganglia and internal capsule hemorrhage. She was transferred to the [facility] ICU for further management. She has been transferred to the Neurology service from the Hospitalist service with the ICU physician team consulting.    SUBJECTIVE (INTERVAL HISTORY) Complains of back pain. States she is allergic to codeine. Tylenol is not helping.  MRI showed stable hematoma without expansion   OBJECTIVE Temp:  [97.4 F (36.3 C)-98.4 F (36.9 C)] 97.9 F (36.6 C) (01/12 0400) Pulse Rate:  [66-104] 88 (01/12 0600) Cardiac Rhythm: Normal sinus rhythm (01/11 1600) Resp:  [15-32] 32 (01/12 0700) BP: (107-169)/(57-132) 139/77 (01/12 0700) SpO2:  [95 %-100 %] 98 % (01/12 0600) Weight:  [47.7 kg (105 lb 2.6 oz)] 47.7 kg (105 lb 2.6 oz) (01/12 0422)  CBC:   Recent Labs Lab 04/26/16 1139  04/28/16 0336  WBC 10.7* 9.8  HGB 13.1 13.5  HCT 36.9 38.6  MCV 87.0 88.3  PLT 302 [id number]    Basic Metabolic Panel:  Recent Labs Lab 04/26/16 2052  04/27/16 1421 04/27/16 1630 04/28/16 0336  NA  --   < > 128* 128*  --   K  --   < > 3.8 3.8  --   CL  --   < > 98* 96*  --   CO2  --   < > 21* 22  --   GLUCOSE  --   < > 133* 118*  --   BUN  --   < > 14 18  --   CREATININE  --   < > 0.65 0.76  --   CALCIUM  --   < > 9.3 9.4  --   MG 2.0  --   --   --  1.9  PHOS 2.4*  --   --   --  2.7  < > = values in this interval not displayed.  Lipid Panel:     Component Value Date/Time   CHOL 205 (H) 04/28/2016 0336   TRIG 72 04/28/2016 0336   HDL 60 04/28/2016 0336   CHOLHDL 3.4 04/28/2016 0336   VLDL 14 04/28/2016 0336   LDLCALC 131 (H) 04/28/2016 0336   HgbA1c:  Lab Results  Component Value Date   HGBA1C (H) 12/09/2008    6.3 (NOTE) The ADA recommends the following therapeutic goal for glycemic control related to Hgb  A1c measurement: Goal of therapy: <6.5 Hgb A1c  Reference: American Diabetes Association: Clinical Practice Recommendations 2010, Diabetes Care, 2010, 33: (Suppl  1).    IMAGING  Dg Chest 2 View  Result Date: 04/26/2016 CLINICAL DATA:  Dull chest pain for 2 days. EXAM: CHEST  2 VIEW COMPARISON:  10/29/2014. FINDINGS: The heart size and mediastinal contours are within normal limits. Both lungs are clear. Severe thoracolumbar scoliosis convex RIGHT, unchanged. IMPRESSION: No active cardiopulmonary disease.  Stable exam. Electronically Signed   By: Staci Righter M.D.   On: 04/26/2016 12:06   Ct Head Wo Contrast  Result Date: 04/26/2016 CLINICAL DATA:  Clinical findings consistent with stroke. Changes in speech and right arm over the last few hours. EXAM: CT HEAD WITHOUT CONTRAST TECHNIQUE: Contiguous axial images were obtained from the base of the skull through the vertex without intravenous contrast. COMPARISON:  MRI brain 03/04/2010.  CT head 12/09/2008 FINDINGS:  Brain: There is an acute intraparenchymal hematoma in the region of the left internal capsule measuring 2.2 x 2.3 cm in diameter. Mild surrounding edema. No mass effect or midline shift. Diffuse cerebral atrophy. Mild ventricular dilatation consistent with central atrophy. Low-attenuation changes in the deep white matter consistent with small vessel ischemia. No abnormal extra-axial fluid collections. Gray-white matter junctions are distinct. Basal cisterns are not effaced. Vascular: Diffuse vascular calcifications. The left vertebral artery is densely calcified and ectatic. Appearance is similar to prior studies. Skull: Normal. Negative for fracture or focal lesion. Sinuses/Orbits: No acute finding. Other: None. IMPRESSION: Acute intraparenchymal hemorrhage in the left internal capsule region with mild surrounding edema. Underlying chronic atrophy and small vessel ischemia. These results were called by telephone at the time of interpretation on 04/26/2016 at 10:03 pm to Dr. Cristal Ford , who verbally acknowledged these results. Electronically Signed   By: Lucienne Capers M.D.   On: 04/26/2016 22:11   Mr Jodene Nam Head Wo Contrast  Result Date: 04/27/2016 CLINICAL DATA:  Continued surveillance intraparenchymal hemorrhage. RIGHT-sided weakness. EXAM: MRI HEAD WITHOUT CONTRAST MRA HEAD WITHOUT CONTRAST TECHNIQUE: Multiplanar, multiecho pulse sequences of the brain and surrounding structures were obtained without intravenous contrast. Angiographic images of the head were obtained using MRA technique without contrast. COMPARISON:  CT head 04/26/2016. FINDINGS: MRI HEAD FINDINGS Brain: Acute intraparenchymal basal ganglia hemorrhage, epicenter posterior lentiform nucleus and internal capsule on the LEFT, markedly T2 hypointense consistent with deoxyhemoglobin, correlating with CT appearance, having cross-sectional measurements of 24 x 29 x 30 mm (R-L x A-P x C-C), corresponding to a volume of 10.5 mL. Mild surrounding  edema. No midline shift. Normal for age cerebral volume. Extensive focal and confluent T2 and FLAIR hyperintensities throughout the white matter, likely chronic microvascular ischemic change. Gradient sequence demonstrates numerous areas of susceptibility (greater than 10) throughout the deep white matter, deep nuclei, brainstem, vermis, and cerebellar hemispheres consistent with microbleeds related to chronic hypertensive cerebrovascular disease. Vascular: Heavily calcified and dolichoectatic distal LEFT vertebral demonstrates susceptibility due to mineralization representing heavy calcification along its V4 segment. Flow voids are maintained throughout. Skull and upper cervical spine: Unremarkable. Sinuses/Orbits: No layering sinus or mastoid fluid. Other: None.  Compared with yesterday's CT, similar appearance. MRA HEAD FINDINGS Dolichoectatic but widely patent internal carotid arteries. Slight BILATERAL cavernous irregularity and outpouchings representing non stenotic atheromatous change. No proximal anterior or middle cerebral artery stenosis or dissection. Dolichoectatic basilar artery swings to the RIGHT. Dolichoectatic distal LEFT vertebral artery mildly dilated without evidence for dissection. RIGHT vertebral artery is diminutive, contributes only to  PICA. Slight irregularity distal MCA and PCA branches consistent with intracranial atherosclerotic change. Fetal origin LEFT PCA. No saccular aneurysm. No vascular malformation. IMPRESSION: Acute intraparenchymal hemorrhage as identified on CT, posterior LEFT lentiform nucleus and internal capsule, measuring 24 x 29 x 30 mm corresponding to a volume of 10.5 mL. Mild surrounding AVM without midline shift. Widespread chronic microbleeds throughout the supratentorial and infratentorial compartment, along with extensive focal and confluent ischemic demyelination consistent with longstanding chronic hypertensive cerebrovascular disease. No intracranial vascular  malformation, proximal stenosis, or dissection. Electronically Signed   By: Staci Righter M.D.   On: 04/27/2016 16:23   Mr Brain Wo Contrast  Result Date: 04/27/2016 CLINICAL DATA:  Continued surveillance intraparenchymal hemorrhage. RIGHT-sided weakness. EXAM: MRI HEAD WITHOUT CONTRAST MRA HEAD WITHOUT CONTRAST TECHNIQUE: Multiplanar, multiecho pulse sequences of the brain and surrounding structures were obtained without intravenous contrast. Angiographic images of the head were obtained using MRA technique without contrast. COMPARISON:  CT head 04/26/2016. FINDINGS: MRI HEAD FINDINGS Brain: Acute intraparenchymal basal ganglia hemorrhage, epicenter posterior lentiform nucleus and internal capsule on the LEFT, markedly T2 hypointense consistent with deoxyhemoglobin, correlating with CT appearance, having cross-sectional measurements of 24 x 29 x 30 mm (R-L x A-P x C-C), corresponding to a volume of 10.5 mL. Mild surrounding edema. No midline shift. Normal for age cerebral volume. Extensive focal and confluent T2 and FLAIR hyperintensities throughout the white matter, likely chronic microvascular ischemic change. Gradient sequence demonstrates numerous areas of susceptibility (greater than 10) throughout the deep white matter, deep nuclei, brainstem, vermis, and cerebellar hemispheres consistent with microbleeds related to chronic hypertensive cerebrovascular disease. Vascular: Heavily calcified and dolichoectatic distal LEFT vertebral demonstrates susceptibility due to mineralization representing heavy calcification along its V4 segment. Flow voids are maintained throughout. Skull and upper cervical spine: Unremarkable. Sinuses/Orbits: No layering sinus or mastoid fluid. Other: None.  Compared with yesterday's CT, similar appearance. MRA HEAD FINDINGS Dolichoectatic but widely patent internal carotid arteries. Slight BILATERAL cavernous irregularity and outpouchings representing non stenotic atheromatous  change. No proximal anterior or middle cerebral artery stenosis or dissection. Dolichoectatic basilar artery swings to the RIGHT. Dolichoectatic distal LEFT vertebral artery mildly dilated without evidence for dissection. RIGHT vertebral artery is diminutive, contributes only to PICA. Slight irregularity distal MCA and PCA branches consistent with intracranial atherosclerotic change. Fetal origin LEFT PCA. No saccular aneurysm. No vascular malformation. IMPRESSION: Acute intraparenchymal hemorrhage as identified on CT, posterior LEFT lentiform nucleus and internal capsule, measuring 24 x 29 x 30 mm corresponding to a volume of 10.5 mL. Mild surrounding AVM without midline shift. Widespread chronic microbleeds throughout the supratentorial and infratentorial compartment, along with extensive focal and confluent ischemic demyelination consistent with longstanding chronic hypertensive cerebrovascular disease. No intracranial vascular malformation, proximal stenosis, or dissection. Electronically Signed   By: Staci Righter M.D.   On: 04/27/2016 16:23    PHYSICAL EXAM Frail petite elderly Caucasian lady not in distress. Anxious. . Afebrile. Head is nontraumatic. Neck is supple without bruit.    Cardiac exam no murmur or gallop. Lungs are clear to auscultation. Distal pulses are well felt. Neurological Exam ;  Awake  Alert oriented x 3. Mildly dysarthric speech Fundi not visualized.eye movements full without nystagmus.fundi were not visualized. Vision acuity and fields appear normal. Hearing is normal. Palatal movements are normal. Face asymmetric with mild right lower facial weakness.. Tongue midline. Normal strength, tone, reflexes and coordination except for mild right lower extremity drift. Diminished fine finger movements on the right. Orbits left over right upper  extremity.. Normal sensation. Gait deferred.  ASSESSMENT/PLAN [name] is a 76 y.o. female who was admitted to [facility] with  increasing back pain, anxiety, and hyponatremia who developed right sided weakness while ambulating to the bedside commode 04/26/2016. CT showed left basal ganglia/internal capsule hemorrhage.   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 Mild surrounding AVM without midline shift.Widespread chronic microbleeds throughout the supratentorial and infratentorial compartment, along with extensive focal and confluent ischemic demyelination consistent with longstanding chronic hypertensive cerebrovascular disease. MRA  No intracranial vascular malformation, proximal stenosis, or  dissection  Carotid Doppler  ordered  2D Echo  ordered  LDL 131  HgbA1c ordered  SCDs for VTE prophylaxis Diet regular Room service appropriate? Yes; Fluid consistency: Thin  No antithrombotic prior to admission  Ongoing aggressive stroke risk factor management  Therapy recommendations:  PT, OT and SLP cognition added. May be OOB  Disposition:  pending   plan transfer to the floor                                                                                                                                                                                                             Hypertensive emergency   Blood pressure as high as 204/101 in setting of neurologic symptoms   Treated with Cardene drip   Blood pressure improved today  Systolic blood pressure goal less than 160   O medications : Norvasc 5, Zebeta 5   Long-term BP goal normotensive  Other Stroke Risk Factors  Advanced age  Other Active Problems  Anxiety, increased at home per son. Sensitive to lots of meds. Has only tried zoloft which made her dizzy. Does not want  xanax. Dizzy on minimal doses. Discussed options. She is agreeable to start low dose - will start celexa 5 mg daily while hospitalized. Stop prozac ordered last night.  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 Agree with note above. Plan transfer to neurology floor bed today. Mobilize out of bed. Physical occupational consults. Depending upon how well she walks anticipate discharge either to home or rehabilitation over the next few days. Try tramadol for her back pain. Greater than 50% time during this 35 minute visit was spent on counseling and coordination of care about her brain hemorrhage, hypertension and back pain  Antony Contras, MD Medical Director [facility] Pager: [phone] 04/28/2016 2:43 PM  To contact Stroke Continuity provider, please refer to [email or web address] After hours, contact General Neurology

## 2016-04-28 NOTE — Progress Notes (Signed)
*  PRELIMINARY RESULTS* Vascular Ultrasound Carotid Duplex (Doppler) has been completed.  Preliminary findings: Findings consistent with a high end 1- 39 percent stenosis involving the right internal carotid artery and the left internal carotid artery. Heterogeneous thyroid bilaterally.  Everrett Coombe 04/28/2016, 3:21 PM

## 2016-04-28 NOTE — Evaluation (Signed)
Occupational Therapy Evaluation Patient Details Name: Patricia Beck MRN: [id number] DOB: [birth year] Today's Date: 04/28/2016    History of Present Illness Patient is a 76 y/o female with hx of HTN, hypothyrodism, chronic back pain, cataract presents with chest pain secondary to anxiety. When transferring to [facility], pt with onset of right sided weakness and slurred speech. Code stroke initiated. Head CT-medium-sized acute left basal ganglia and internal capsule hemorrhage.   Clinical Impression   This 76 yo female admitted with above presents to acute OT with deficits below (see OT problem list) thus affecting her PLOF to being totally independent at home including driving. She will benefit from acute OT with follow up OT at SNF to work back towards PLOF.    Follow Up Recommendations  SNF;Supervision/Assistance - 24 hour    Equipment Recommendations  Other (comment) (TBD at next venue)       Precautions / Restrictions Precautions Precautions: Fall Restrictions Weight Bearing Restrictions: No      Mobility Bed Mobility Overal bed mobility: Needs Assistance Bed Mobility: Supine to Sit     Supine to sit: Mod assist;HOB elevated     General bed mobility comments: Assist to elevate trunk to get to EOB with pt reaching for therapist for help.   Transfers Overall transfer level: Needs assistance Equipment used: 2 person hand held assist Transfers: Sit to/from Stand Sit to Stand: Min assist;+2 physical assistance;+2 safety/equipment         General transfer comment: Assist to power to standing. No dizziness reported.     Balance Overall balance assessment: Needs assistance Sitting-balance support: Feet supported;No upper extremity supported Sitting balance-Leahy Scale: Fair     Standing balance support: During functional activity;Single extremity supported Standing balance-Leahy Scale: Poor Standing balance comment: Requires at least 1 UE support.                             ADL Overall ADL's : Needs assistance/impaired Eating/Feeding: Supervision/ safety;Set up (supported sitting)   Grooming: Supervision/safety;Set up (supported sitting)   Upper Body Bathing: Supervision/ safety;Set up (supported sitting)   Lower Body Bathing: Minimal assistance;Sit to/from stand   Upper Body Dressing : Minimal assistance (supported sitting)   Lower Body Dressing: Minimal assistance;Sit to/from stand   Toilet Transfer: Minimal assistance;Stand-pivot   Toileting- Clothing Manipulation and Hygiene: Moderate assistance Toileting - Clothing Manipulation Details (indicate cue type and reason): min A sit<>stand             Vision Vision Assessment?: Yes Eye Alignment: Within Functional Limits Ocular Range of Motion: Within Functional Limits Alignment/Gaze Preference: Within Defined Limits Tracking/Visual Pursuits: Able to track stimulus in all quads without difficulty Convergence: Within functional limits Visual Fields: No apparent deficits         Pertinent Vitals/Pain Pain Assessment: Faces Faces Pain Scale: Hurts even more Pain Location: back  Pain Descriptors / Indicators: Sore;Aching Pain Intervention(s): Monitored during session;Repositioned     Hand Dominance Right   Extremity/Trunk Assessment Upper Extremity Assessment Upper Extremity Assessment: RUE deficits/detail RUE Deficits / Details: lag behind LUE for raising arms over head and arm would drift slowly downward. decreased grip over LUE even though RUE is dominant hand, decreased finger opposition RUE Coordination: decreased fine motor;decreased gross motor   Lower Extremity Assessment Lower Extremity Assessment: RLE deficits/detail RLE Deficits / Details: Grossly ~4/5 throughout except hip flexion ~3/5. Reports tingling in feet- premorbid RLE Sensation:  [facility])  Communication Communication Communication: No difficulties   Cognition Arousal/Alertness:  Awake/alert Behavior During Therapy: Anxious Overall Cognitive Status: Impaired/Different from baseline Area of Impairment: Awareness;Safety/judgement (A&Ox4)               General Comments: Easily distracted. Some right inattention noted. As soon as therapists leaving room, "you are going to leave me like this," and immediately reporting increased pain in back and new HA.              Home Living Family/patient expects to be discharged to:: Private residence Living Arrangements: Alone Available Help at Discharge: Family;Available PRN/intermittently Type of Home: Apartment Home Access: Level entry     Home Layout: One level     Bathroom Shower/Tub: Teacher, early years/pre: Standard     Home Equipment: Environmental consultant - 2 wheels;Cane - single point          Prior Functioning/Environment Level of Independence: Independent with assistive device(s)        Comments: Uses [employer] PRN. Drives, Cooks, cleans.        OT Problem List: Decreased strength;Decreased range of motion;Impaired balance (sitting and/or standing);Decreased safety awareness;Decreased knowledge of use of DME or AE;Impaired UE functional use   OT Treatment/Interventions: Self-care/ADL training;Therapeutic activities;Therapeutic exercise;Patient/family education;DME and/or AE instruction;Balance training    OT Goals(Current goals can be found in the care plan section) Acute Rehab OT Goals Patient Stated Goal: to go to rehab before going home OT Goal Formulation: With patient Time For Goal Achievement: 05/05/16 Potential to Achieve Goals: Good  OT Frequency: Min 2X/week   Barriers to D/C: Decreased caregiver support          Co-evaluation PT/OT/SLP Co-Evaluation/Treatment: Yes Reason for Co-Treatment: For patient/therapist safety;Necessary to address cognition/behavior during functional activity PT goals addressed during session: Mobility/safety with mobility OT goals addressed during  session: ADL's and self-care;Strengthening/ROM      End of Session Equipment Utilized During Treatment: Gait belt Nurse Communication: Mobility status;Patient requests pain meds  Activity Tolerance: Patient tolerated treatment well Patient left: in chair;with call bell/phone within reach;with chair alarm set   Time: 1205-1223 OT Time Calculation (min): 18 min Charges:  OT General Charges $OT Visit: 1 Procedure OT Evaluation $OT Eval Moderate Complexity: 1 Procedure  Almon Register [id number] 04/28/2016, 3:50 PM

## 2016-04-28 NOTE — Progress Notes (Signed)
Report called to [employer] on 60M.

## 2016-04-28 NOTE — Evaluation (Signed)
Physical Therapy Evaluation Patient Details Name: Patricia Beck MRN: [id number] DOB: 1941-02-28 Today's Date: 04/28/2016   History of Present Illness  Patient is a 76 y/o female with hx of HTN, hypothyrodism, chronic back pain, cataract presents with chest pain secondary to anxiety. When transferring to [facility], pt with onset of right sided weakness and slurred speech. Code stroke initiated. Head CT-medium-sized acute left basal ganglia and internal capsule hemorrhage.  Clinical Impression  Patient presents with mild right sided weakness, mild right inattention, impaired balance and baseline anxiety s/p above. Tolerated gait training with Min A for balance/safety. Pt easily distracted from pain but reverts back quickly once distractions are removed. Pt lives alone and Mod I PTA. Recommend SNF to maximize independence and mobility prior to return home. Will follow.    Follow Up Recommendations SNF    Equipment Recommendations  None recommended by PT    Recommendations for Other Services       Precautions / Restrictions Precautions Precautions: Fall Restrictions Weight Bearing Restrictions: No      Mobility  Bed Mobility Overal bed mobility: Needs Assistance Bed Mobility: Supine to Sit     Supine to sit: Mod assist;HOB elevated     General bed mobility comments: Assist to elevate trunk to get to EOB with pt reaching for therapist for help.   Transfers Overall transfer level: Needs assistance Equipment used: 2 person hand held assist Transfers: Sit to/from Stand Sit to Stand: Min assist;+2 physical assistance;+2 safety/equipment         General transfer comment: Assist to power to standing. No dizziness reported.   Ambulation/Gait Ambulation/Gait assistance: Min assist;+2 physical assistance;+2 safety/equipment Ambulation Distance (Feet): 20 Feet Assistive device: 2 person hand held assist;1 person hand held assist Gait Pattern/deviations: Step-through pattern;Decreased  stride length;Decreased step length - right;Decreased step length - left;Narrow base of support;Leaning posteriorly Gait velocity: decreased Gait velocity interpretation: Below normal speed for age/gender General Gait Details: Slow, unsteady gait with handheld assist x2 progressing to handheld assist of 1, with instances of posterior LOB and not able to self correct without assist.   Stairs            Wheelchair Mobility    Modified Rankin (Stroke Patients Only) Modified Rankin (Stroke Patients Only) Pre-Morbid Rankin Score: No significant disability Modified Rankin: Moderately severe disability     Balance Overall balance assessment: Needs assistance Sitting-balance support: Feet supported;No upper extremity supported Sitting balance-Leahy Scale: Fair     Standing balance support: During functional activity;Single extremity supported Standing balance-Leahy Scale: Poor Standing balance comment: Requires at least 1 UE support.                             Pertinent Vitals/Pain Pain Assessment: Faces Faces Pain Scale: Hurts even more Pain Location: back  Pain Descriptors / Indicators: Sore;Aching Pain Intervention(s): Monitored during session;Repositioned    Home Living Family/patient expects to be discharged to:: Private residence Living Arrangements: Alone Available Help at Discharge: Family;Available PRN/intermittently Type of Home: Apartment Home Access: Level entry     Home Layout: One level Home Equipment: Walker - 2 wheels;Cane - single point      Prior Function Level of Independence: Independent with assistive device(s)         Comments: Uses SPC PRN. Drives, Cooks, cleans.     Hand Dominance   Dominant Hand: Right    Extremity/Trunk Assessment   Upper Extremity Assessment Upper Extremity Assessment: Defer to OT  evaluation (Weaker grip right hand compared to left)    Lower Extremity Assessment Lower Extremity Assessment: RLE  deficits/detail RLE Deficits / Details: Grossly ~4/5 throughout except hip flexion ~3/5. Reports tingling in feet- premorbid RLE Sensation:  [facility])       Communication   Communication: No difficulties  Cognition Arousal/Alertness: Awake/alert Behavior During Therapy: Anxious Overall Cognitive Status: Impaired/Different from baseline Area of Impairment: Awareness;Safety/judgement (A&Ox4)               General Comments: Easily distracted. Some right inattention noted. As soon as therapists leaving room, "you are going to leave me like this," and immediately reporting increased pain in back and new HA.    General Comments General comments (skin integrity, edema, etc.): VSS.    Exercises     Assessment/Plan    PT Assessment Patient needs continued PT services  PT Problem List Decreased strength;Decreased mobility;Decreased safety awareness;Decreased range of motion;Decreased coordination;Decreased activity tolerance;Decreased cognition;Decreased balance          PT Treatment Interventions DME instruction;Therapeutic activities;Gait training;Therapeutic exercise;Patient/family education;Balance training;Functional mobility training;Neuromuscular re-education    PT Goals (Current goals can be found in the Care Plan section)  Acute Rehab PT Goals Patient Stated Goal: to go to rehab before going home PT Goal Formulation: With patient Time For Goal Achievement: 05/12/16 Potential to Achieve Goals: Good    Frequency Min 4X/week   Barriers to discharge Decreased caregiver support lives alone    Co-evaluation PT/OT/SLP Co-Evaluation/Treatment: Yes Reason for Co-Treatment: For patient/therapist safety;Necessary to address cognition/behavior during functional activity PT goals addressed during session: Mobility/safety with mobility         End of Session Equipment Utilized During Treatment: Gait belt Activity Tolerance: Patient limited by pain;Patient tolerated treatment  well Patient left: in chair;with call bell/phone within reach;with chair alarm set Nurse Communication: Mobility status         Time: 1205-1223 PT Time Calculation (min) (ACUTE ONLY): 18 min   Charges:   PT Evaluation $PT Eval Moderate Complexity: 1 Procedure     PT G Codes:        Martrice Apt A Safaa Stingley 04/28/2016, 1:11 PM Wray Kearns, [place], DPT [phone]

## 2016-04-28 NOTE — Progress Notes (Signed)
Approximately 1100, pt could not void in bedpan. We tried bedside commode, she could not void. I bladder scanned her it said greater than 415. This pt has been I and Od for three shifts, and is VERY anxious about it. I requested a foley from provider. Order received. Foley placed under sterile conditions by me and Melody RN.

## 2016-04-29 DIAGNOSIS — G8929 Other chronic pain: Secondary | ICD-10-CM

## 2016-04-29 DIAGNOSIS — M549 Dorsalgia, unspecified: Secondary | ICD-10-CM

## 2016-04-29 DIAGNOSIS — E039 Hypothyroidism, unspecified: Secondary | ICD-10-CM

## 2016-04-29 LAB — BASIC METABOLIC PANEL
ANION GAP: 10 (ref 5–15)
BUN: 23 mg/dL — ABNORMAL HIGH (ref 6–20)
CALCIUM: 9.2 mg/dL (ref 8.9–10.3)
CO2: 21 mmol/L — AB (ref 22–32)
CREATININE: 0.86 mg/dL (ref 0.44–1.00)
Chloride: 96 mmol/L — ABNORMAL LOW (ref 101–111)
GFR calc Af Amer: >60 mL/min (ref >60)
GLUCOSE: 106 mg/dL — AB (ref 65–99)
Potassium: 3.6 mmol/L (ref 3.5–5.1)
Sodium: 127 mmol/L — ABNORMAL LOW (ref 135–145)

## 2016-04-29 LAB — CBC
HEMATOCRIT: 37.3 % (ref 36.0–46.0)
Hemoglobin: 12.9 g/dL (ref 12.0–15.0)
MCH: 30.5 pg (ref 26.0–34.0)
MCHC: 34.6 g/dL (ref 30.0–36.0)
MCV: 88.2 fL (ref 78.0–100.0)
Platelets: 327 10*3/uL (ref 150–400)
RBC: 4.23 MIL/uL (ref 3.87–5.11)
RDW: 13.4 % (ref 11.5–15.5)
WBC: 10.9 10*3/uL — AB (ref 4.0–10.5)

## 2016-04-29 LAB — HEMOGLOBIN A1C
HEMOGLOBIN A1C: 5.7 % — AB (ref 4.8–5.6)
Mean Plasma Glucose: 117 mg/dL

## 2016-04-29 MED ORDER — SENNOSIDES-DOCUSATE SODIUM 8.6-50 MG PO TABS
2.0000 | ORAL_TABLET | Freq: Two times a day (BID) | ORAL | Status: DC
  Administered 2016-04-29 – 2016-05-01 (×4): 2 via ORAL
  Filled 2016-04-29 (×4): qty 2

## 2016-04-29 MED ORDER — AMLODIPINE BESYLATE 5 MG PO TABS
5.0000 mg | ORAL_TABLET | Freq: Every day | ORAL | Status: DC
  Administered 2016-04-29 – 2016-04-30 (×2): 5 mg via ORAL
  Filled 2016-04-29 (×2): qty 1

## 2016-04-29 MED ORDER — TRAMADOL HCL 50 MG PO TABS
50.0000 mg | ORAL_TABLET | Freq: Two times a day (BID) | ORAL | Status: DC | PRN
  Administered 2016-04-29 – 2016-05-01 (×4): 50 mg via ORAL
  Filled 2016-04-29 (×4): qty 1

## 2016-04-29 MED ORDER — BISOPROLOL FUMARATE 10 MG PO TABS
10.0000 mg | ORAL_TABLET | Freq: Every day | ORAL | Status: DC
  Administered 2016-04-30 – 2016-05-01 (×2): 10 mg via ORAL
  Filled 2016-04-29 (×2): qty 1

## 2016-04-29 MED ORDER — THYROID 30 MG PO TABS
90.0000 mg | ORAL_TABLET | Freq: Every day | ORAL | Status: DC
  Administered 2016-04-30 – 2016-05-01 (×2): 90 mg via ORAL
  Filled 2016-04-29 (×3): qty 1

## 2016-04-29 MED ORDER — CITALOPRAM HYDROBROMIDE 10 MG PO TABS
10.0000 mg | ORAL_TABLET | Freq: Every day | ORAL | Status: DC
  Administered 2016-04-30 – 2016-05-01 (×2): 10 mg via ORAL
  Filled 2016-04-29 (×2): qty 1

## 2016-04-29 NOTE — Consult Note (Signed)
Medical Consultation   Patricia Beck  [id number]  DOB: 06-09-1940   DOA: 04/26/2016 PCP: Pcp Not In System   Requesting physician: Dr Leonie Man   Reason for consultation: HTN    History of Present Illness: Patricia Beck is an 76 y.o. female  hypothyroidism, hypertension, anxiety, hyponatremia presented to [facility] with complaining of chest pain, back pain and anxiety, felt that was secondary to anxiety as troponin and EKG shows no change S I ADH versus polydipsia. She was placed on fluid restriction and monitor. One day after admission 1/11 patient was noted to have significant elevated blood pressure. She was place in the stepdown unit with the antihypertensive medication. She was noted to have acute onset of right facial droop right upper extremity weakness, code stroke was called, CT done emergently showed a medium-sized acute left basal ganglia and internal capsule hemorrhage she was transferred to the neurology service at [facility] for further management. Critical care was following the patient for hypertensive urgency and as TRH to take over for further monitoring.  Patient seen and examined, she is very anxious and is complaining of back pain that is chronic for a year due to a scoliosis. She reported the pain doesn't ease with Tylenol. She also reported that she has not had a bowel movement for the past 6 days. Also she reported that her anxiety  very severe but the addition of Celexa has helped a little. Denies chest pain, short of breath, nausea, vomiting, dizziness and palpitations   Review of Systems:  ROS Per HPI    Past Medical History: Past Medical History:  Diagnosis Date  . Cataract   . Fibroma 2006  . Hypertension   . Thyroid disease     Past Surgical History: Past Surgical History:  Procedure Laterality Date  . EYE SURGERY    . fibroma      Allergies:   Allergies  Allergen Reactions  . Aciphex [Rabeprazole Sodium]   . Aleve [Naproxen Sodium]    . Atarax [Hydroxyzine]   . Belsomra [Suvorexant] Other (See Comments)    Nightmares  . Buspirone   . Codeine   . Erythromycin   . Keflex [Cephalexin]   . Lidocaine   . Prednisone     Insomnia, nausea, hot flush   . Tramadol   . Zoloft [Sertraline Hcl]     Family History: Family History  Problem Relation Age of Onset  . Diabetes Father     Physical Exam: Vitals:   04/29/16 0110 04/29/16 0500 04/29/16 1001 04/29/16 1300  BP: 137/76 (!) 161/87 (!) 177/84 (!) 170/94  Pulse: 91 94 95 97  Resp: 20 20 19 20   Temp: 98.4 F (36.9 C) 98.4 F (36.9 C) 98 F (36.7 C) 98.9 F (37.2 C)  TempSrc: Oral Oral Oral Oral  SpO2: 96% 98% 99% 98%  Weight:  53.9 kg (118 lb 12.8 oz)    Height:        Constitutional  Frail, elderly, Very Anxious.  Neck: neck appears normal, no masses, normal ROM, no thyromegaly, no JVD  CVS: S1-S2 clear, no murmur rubs or gallops, no LE edema, normal pedal pulses  Respiratory:  clear to auscultation bilaterally, no wheezing, rales or rhonchi Abdomen: soft nontender, nondistended, normal bowel sounds, no hepatosplenomegaly, no hernias  Musculoskeletal: : no cyanosis, clubbing or edema noted bilaterally Neuro: Mild dysarthria, Asymmetric face with lower facial weakness, Strength 4/5 RLE Psych: Anxious  Skin: no rashes or lesions or ulcers, no induration or nodules   Data reviewed:  I have personally reviewed following labs and imaging studies Labs:  CBC:  Recent Labs Lab 04/26/16 1139 04/28/16 0336 04/29/16 0254  WBC 10.7* 9.8 10.9*  HGB 13.1 13.5 12.9  HCT 36.9 38.6 37.3  MCV 87.0 88.3 88.2  PLT 302 290 [id number]    Basic Metabolic Panel:  Recent Labs Lab 04/26/16 2052 04/27/16 0750 04/27/16 1123 04/27/16 1421 04/27/16 1630 04/28/16 0336 04/29/16 0254  NA  --  130* 128* 128* 128*  --  127*  K  --  3.5 3.6 3.8 3.8  --  3.6  CL  --  99* 98* 98* 96*  --  96*  CO2  --  23 21* 21* 22  --  21*  GLUCOSE  --  118* 159* 133* 118*  --  106*  BUN   --  12 13 14 18   --  23*  CREATININE  --  0.57 0.68 0.65 0.76  --  0.86  CALCIUM  --  8.9 9.1 9.3 9.4  --  9.2  MG 2.0  --   --   --   --  1.9  --   PHOS 2.4*  --   --   --   --  2.7  --    GFR Estimated Creatinine Clearance: 45.4 mL/min (by C-G formula based on SCr of 0.86 mg/dL). Liver Function Tests: No results for input(s): AST, ALT, ALKPHOS, BILITOT, PROT, ALBUMIN in the last 168 hours. No results for input(s): LIPASE, AMYLASE in the last 168 hours. No results for input(s): AMMONIA in the last 168 hours. Coagulation profile No results for input(s): INR, PROTIME in the last 168 hours.  Cardiac Enzymes:  Recent Labs Lab 04/26/16 2052  TROPONINI <0.03   BNP: Invalid input(s): POCBNP CBG:  Recent Labs Lab 04/26/16 2128  GLUCAP 150*   D-Dimer No results for input(s): DDIMER in the last 72 hours. Hgb A1c  Recent Labs  04/28/16 0336  HGBA1C 5.7*   Lipid Profile  Recent Labs  04/28/16 0336  CHOL 205*  HDL 60  LDLCALC 131*  TRIG 72  CHOLHDL 3.4   Thyroid function studies  Recent Labs  04/26/16 2052  TSH 5.293*   Anemia work up No results for input(s): VITAMINB12, FOLATE, FERRITIN, TIBC, IRON, RETICCTPCT in the last 72 hours. Urinalysis    Component Value Date/Time   COLORURINE COLORLESS (A) 04/26/2016 1702   APPEARANCEUR CLEAR 04/26/2016 1702   LABSPEC 1.004 (L) 04/26/2016 1702   PHURINE 9.0 (H) 04/26/2016 1702   GLUCOSEU NEGATIVE 04/26/2016 1702   HGBUR NEGATIVE 04/26/2016 1702   BILIRUBINUR NEGATIVE 04/26/2016 1702   BILIRUBINUR negative 01/03/2016 1501   KETONESUR NEGATIVE 04/26/2016 1702   PROTEINUR NEGATIVE 04/26/2016 1702   UROBILINOGEN 0.2 01/03/2016 1501   UROBILINOGEN 0.2 07/30/2014 1728   NITRITE NEGATIVE 04/26/2016 1702   LEUKOCYTESUR NEGATIVE 04/26/2016 1702     Microbiology Recent Results (from the past 240 hour(s))  MRSA PCR Screening     Status: None   Collection Time: 04/26/16 11:59 PM  Result Value Ref Range Status    MRSA by PCR NEGATIVE NEGATIVE Final    Comment:        The GeneXpert MRSA Assay (FDA approved for NASAL specimens only), is one component of a comprehensive MRSA colonization surveillance program. It is not intended to diagnose MRSA infection nor to guide or monitor treatment for MRSA infections.  Inpatient Medications:   Scheduled Meds: . [START ON 04/30/2016] bisoprolol  10 mg Oral Daily  . calcium citrate  200 mg of elemental calcium Oral Daily  . cholecalciferol  5,000 Units Oral Daily  . [START ON 04/30/2016] citalopram  10 mg Oral Daily  . pravastatin  40 mg Oral q1800  . senna-docusate  2 tablet Oral BID  . thyroid  60 mg Oral QAC breakfast  . vitamin C  1,000 mg Oral Daily    Radiological Exams on Admission: No results found.  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 secondary to uncontrolled hypertension Mild right hemiparesis Continue management per neurology team  Awaiting for CIR   Hypertensive emergency - blood pressure peaked at 204/101  Was treated with Cardene drip - now off started on Norvasc and Zebeta  BP remains poorly controlled - will increase bisoprolol to 10 mg daily Systolic blood pressure goal is less than 160 Hydralazine when necessary for SSBP is above 150 Continue to monitor blood pressure  Chronic back pain due to a scoliosis Added tramadol - real allergy to this medication patient reported she felt funny Continue Tylenol when necessary  Anxiety Started on Celexa by neurologist recommend to increase the dose to 10 mg as normally 5 mg does not work well for anxiety.  Hypothyroidism - elevated TSH this could be also contributing to hyponatremia  Will increase Amour  Check free T4  Check TSH in 6 weeks   Hyponatremia secondary to SIADH, thyroid could also be contributing  See above   Thank you for this consultation.  Our [facility] hospitalist team will follow the patient with  you.  Time Spent: 45 minutes discussing plan of treatment   Chipper Oman M.D. [facility] 04/29/2016, 4:31 PM

## 2016-04-29 NOTE — Clinical Social Work Placement (Signed)
   CLINICAL SOCIAL WORK PLACEMENT  NOTE  Date:  04/29/2016  Patient Details  Name: Patricia Beck MRN: [id number] Date of Birth: February 07, 1941  Clinical Social Work is seeking post-discharge placement for this patient at the [place] level of care (*CSW will initial, date and re-position this form in  chart as items are completed):  Yes   Patient/family provided with [facility] Work Department's list of facilities offering this level of care within the geographic area requested by the patient (or if unable, by the patient's family).  Yes   Patient/family informed of their freedom to choose among providers that offer the needed level of care, that participate in Medicare, Medicaid or managed care program needed by the patient, have an available bed and are willing to accept the patient.  Yes   Patient/family informed of [facility]'s ownership interest in [facility] and [facility], as well as of the fact that they are under no obligation to receive care at these facilities.  PASRR submitted to EDS on 04/29/16     PASRR number received on 04/29/16     Existing PASRR number confirmed on       FL2 transmitted to all facilities in geographic area requested by pt/family on 04/29/16     FL2 transmitted to all facilities within larger geographic area on       Patient informed that his/her managed care company has contracts with or will negotiate with certain facilities, including the following:            Patient/family informed of bed offers received.  Patient chooses bed at       Physician recommends and patient chooses bed at      Patient to be transferred to   on  .  Patient to be transferred to facility by       Patient family notified on   of transfer.  Name of family member notified:        PHYSICIAN Please sign FL2     Additional Comment:    _______________________________________________ Lilly [place], LCSW 04/29/2016,  11:06 AM

## 2016-04-29 NOTE — Clinical Social Work Note (Signed)
Clinical Social Work Assessment  Patient Details  Name: Patricia Beck MRN: [id number] Date of Birth: 12/06/1940  Date of referral:  04/29/16               Reason for consult:  Facility Placement                Permission sought to share information with:  Facility Art therapist granted to share information::  Yes, Verbal Permission Granted  Name::        Agency::  SNFs  Relationship::     Contact Information:     Housing/Transportation Living arrangements for the past 2 months:  Single Family Home Source of Information:  Patient Patient Interpreter Needed:  None Criminal Activity/Legal Involvement Pertinent to Current Situation/Hospitalization:  No - Comment as needed Significant Relationships:  Adult Children Lives with:  Self Do you feel safe going back to the place where you live?  No Need for family participation in patient care:  No (Coment)  Care giving concerns:  CSW received consult for possible SNF placement at time of discharge. CSW spoke with patient regarding PT recommendation of SNF placement at time of discharge. Patient reported that she lives alone and given patient's current physical needs and fall risk, she would like rehab. Patient expressed understanding of PT recommendation and is agreeable to SNF placement at time of discharge. CSW to continue to follow and assist with discharge planning needs.   Social Worker assessment / plan:  CSW spoke with patient concerning possibility of rehab at [facility] before returning home.  Employment status:  Retired Forensic scientist:  Medicare PT Recommendations:  [place] / Referral to community resources:  [place]  Patient/Family's Response to care:  Patient recognizes need for rehab before returning home and is agreeable to a SNF in [place].   Patient/Family's Understanding of and Emotional Response to Diagnosis, Current Treatment, and Prognosis:   Patient/family is realistic regarding therapy needs and expressed being hopeful for SNF placement. Patient expressed understanding of CSW role and discharge process. No questions/concerns about plan or treatment.    Emotional Assessment Appearance:  Appears stated age Attitude/Demeanor/Rapport:  Other (Appropriate; Moving around in bed) Affect (typically observed):  Accepting, Restless, Appropriate Orientation:  Oriented to Self, Oriented to Situation, Oriented to Place, Oriented to  Time Alcohol / Substance use:  Not Applicable Psych involvement (Current and /or in the community):  No (Comment)  Discharge Needs  Concerns to be addressed:  Care Coordination Readmission within the last 30 days:  No Current discharge risk:  Lives alone Barriers to Discharge:  Continued Medical Work up   [employer], [place] 04/29/2016, 11:38 AM

## 2016-04-29 NOTE — Progress Notes (Signed)
Pt refused Pravachol. Says she's taken in past and caused muscle aches.

## 2016-04-29 NOTE — NC FL2 (Signed)
[place] LEVEL OF CARE SCREENING TOOL     IDENTIFICATION  Patient Name: Patricia Beck Birthdate: 1940/08/18 Sex: female Admission Date (Current Location): 04/26/2016  [facility] and [place] Number:  Herbalist and Address:  [facility] [place], [place] [address] [place], [place] [address]      Provider Number: [id number]  Attending Physician Name and Address:  Rosalin Hawking, MD  Relative Name and Phone Number:       Current Level of Care: Hospital Recommended Level of Care: [place] Prior Approval Number:    Date Approved/Denied:   PASRR Number: [id number]  Discharge Plan: SNF    Current Diagnoses: Patient Active Problem List   Diagnosis Date Noted  . Intracerebral hemorrhage 04/27/2016  . ICH (intracerebral hemorrhage) ([place]) 04/27/2016  . Stroke determined by clinical assessment ([place])   . Hemorrhage   . Nontraumatic subcortical hemorrhage of left cerebral hemisphere ([place])   . Hypothyroidism 07/30/2014  . Hyponatremia 07/30/2014  . Hypokalemia 07/30/2014  . Hypochloremia 07/30/2014  . Nausea & vomiting 07/30/2014  . UTI (lower urinary tract infection) 07/30/2014  . Chronic back pain 07/30/2014  . Insomnia 07/30/2014  . Hypertension   . Sciatic neuritis 06/30/2014    Orientation RESPIRATION BLADDER Height & Weight     Self, Time, Situation, Place  Normal Continent Weight: 118 lb 12.8 oz (53.9 kg) Height:  5\' 2"  (157.5 cm)  BEHAVIORAL SYMPTOMS/MOOD NEUROLOGICAL BOWEL NUTRITION STATUS      Continent Diet (see DC summary)  AMBULATORY STATUS COMMUNICATION OF NEEDS Skin   Extensive Assist Verbally Normal                       Personal Care Assistance Level of Assistance  Bathing, Feeding, Dressing Bathing Assistance: Limited assistance Feeding assistance: Limited assistance Dressing Assistance: Limited assistance     Functional Limitations Info  Sight, Hearing, Speech Sight Info: Adequate Hearing  Info: Adequate Speech Info: Adequate    SPECIAL CARE FACTORS FREQUENCY  PT (By licensed PT), OT (By licensed OT)     PT Frequency: 5x OT Frequency: 5x            Contractures Contractures Info: Not present    Additional Factors Info  Code Status, Allergies Code Status Info: Full Code Allergies Info: Aciphex Rabeprazole Sodium, Aleve Naproxen Sodium, Atarax Hydroxyzine, Belsomra Suvorexant, Buspirone, Codeine, Erythromycin, Keflex Cephalexin, Lidocaine, Prednisone, Tramadol, Zoloft Sertraline Hcl           Current Medications (04/29/2016):  This is the current hospital active medication list Current Facility-Administered Medications  Medication Dose Route Frequency Provider Last Rate Last Dose  . acetaminophen (TYLENOL) tablet 1,000 mg  1,000 mg Oral Q6H PRN Kerney Elbe, MD   1,000 mg at 04/28/16 2000  . ALPRAZolam Duanne Moron) tablet 0.25 mg  0.25 mg Oral TID PRN Donzetta Starch, NP   0.25 mg at 04/28/16 1958  . bisoprolol (ZEBETA) tablet 5 mg  5 mg Oral Daily Maryann Mikhail, DO   5 mg at 04/28/16 1042  . calcium citrate (CALCITRATE - dosed in mg elemental calcium) tablet 200 mg of elemental calcium  200 mg of elemental calcium Oral Daily Maryann Mikhail, DO   200 mg of elemental calcium at 04/28/16 1043  . cholecalciferol (VITAMIN D) tablet 5,000 Units  5,000 Units Oral Daily Maryann Mikhail, DO   5,000 Units at 04/29/16 1102  . citalopram (CELEXA) tablet 5 mg  5 mg Oral Daily Sharon L  Biby, NP   5 mg at 04/29/16 1102  . hydrALAZINE (APRESOLINE) injection 10 mg  10 mg Intravenous Q6H PRN Maryann Mikhail, DO   10 mg at 04/28/16 1503  . ondansetron (ZOFRAN) tablet 4 mg  4 mg Oral Q6H PRN Maryann Mikhail, DO       Or  . ondansetron (ZOFRAN) injection 4 mg  4 mg Intravenous Q6H PRN Maryann Mikhail, DO      . pravastatin (PRAVACHOL) tablet 40 mg  40 mg Oral q1800 Rosalin Hawking, MD      . thyroid (ARMOUR) tablet 60 mg  60 mg Oral QAC breakfast Maryann Mikhail, DO   60 mg at 04/28/16 0800   . vitamin C (ASCORBIC ACID) tablet 1,000 mg  1,000 mg Oral Daily Maryann Mikhail, DO   1,000 mg at 04/28/16 1043  . zolpidem (AMBIEN) tablet 5 mg  5 mg Oral QHS PRN Maryann Mikhail, DO   5 mg at 04/29/16 0146     Discharge Medications: Please see discharge summary for a list of discharge medications.  Relevant Imaging Results:  Relevant Lab Results:   Additional Information SSN:  [id number]  [place], [place]

## 2016-04-29 NOTE — Evaluation (Signed)
Speech Language Pathology Evaluation Patient Details Name: Patricia Beck MRN: [id number] DOB: 05-15-1940 Today's Date: 04/29/2016 Time: 1205-1222 SLP Time Calculation (min) (ACUTE ONLY): 17 min  Problem List:  Patient Active Problem List   Diagnosis Date Noted  . Intracerebral hemorrhage 04/27/2016  . ICH (intracerebral hemorrhage) ([place]) 04/27/2016  . Stroke determined by clinical assessment ([place])   . Hemorrhage   . Nontraumatic subcortical hemorrhage of left cerebral hemisphere ([place])   . Hypothyroidism 07/30/2014  . Hyponatremia 07/30/2014  . Hypokalemia 07/30/2014  . Hypochloremia 07/30/2014  . Nausea & vomiting 07/30/2014  . UTI (lower urinary tract infection) 07/30/2014  . Chronic back pain 07/30/2014  . Insomnia 07/30/2014  . Hypertension   . Sciatic neuritis 06/30/2014   Past Medical History:  Past Medical History:  Diagnosis Date  . Cataract   . Fibroma 2006  . Hypertension   . Thyroid disease    Past Surgical History:  Past Surgical History:  Procedure Laterality Date  . EYE SURGERY    . fibroma     HPI:  Pt is a 76 y.o. female with PMH of hypothyroidism, hypertension, anxiety, hyponatremia with hospitalization in 2016, presented to ED on 1/10 with complaints of chest pain. Later that date developed R facial droop, slurred speech. MRI on 1/11 showed acute intraparenchymal hemorrhage, posterior left lentiform nucleus and internal capsule, measuring 24 x 29 x 30 mm. Also widespread chronic microbleeds throughout the supratentorial and unfratentorial compartment, along with extensive focal and confluent ischemic demyelination consistent with longstanding chronic hypertensive cerebrovascular disease. Neuro reports that speech is dysarthric and at times garbled/ agrammatical. Speech-language eval ordered.   Assessment / Plan / Recommendation Clinical Impression  Pt currently presenting with a mild dysarthria and mild cognitive deficits. Speech is mildly slurred with  low vocal intensity resulting in reduced speech intelligibility at times. Cognition was difficult to fully assess due to lethargy at time of eval. However, pt was oriented x4 and able to follow multistep commands without difficulty. Pt did demonstrate some decreased alternating attention between visitors, positioning in bed, and evaluation tasks presented. Lethargy increased as eval continued. Pt would benefit from continued speech therapy to further evaluate cognitive skills (esp memory and problem solving) and to address dysarthria both acutely and at next level of care. Will continue to follow.    SLP Assessment  Patient needs continued Speech Lanaguage Pathology Services    Follow Up Recommendations  Skilled Nursing facility    Frequency and Duration min 2x/week  2 weeks      SLP Evaluation Cognition  Overall Cognitive Status: Impaired/Different from baseline Arousal/Alertness: Lethargic Orientation Level: Oriented X4 Attention: Alternating Alternating Attention: Impaired Alternating Attention Impairment: Verbal basic Memory: Appears intact (for tasks assessed, would like to further eval when more ale) Awareness: Appears intact Behaviors: Restless Safety/Judgment: Impaired       Comprehension  Auditory Comprehension Overall Auditory Comprehension: Appears within functional limits for tasks assessed Yes/No Questions: Within Functional Limits Commands: Within Functional Limits Conversation: Complex    Expression Expression Primary Mode of Expression: Verbal Verbal Expression Overall Verbal Expression: Appears within functional limits for tasks assessed Initiation: No impairment Level of Generative/Spontaneous Verbalization: Conversation Repetition: No impairment Naming: No impairment Non-Verbal Means of Communication: Not applicable   Oral / Motor  Oral Motor/Sensory Function Overall Oral Motor/Sensory Function: Mild impairment Facial ROM: Reduced right;Suspected CN VII  (facial) dysfunction Facial Symmetry: Abnormal symmetry right Facial Strength: Within Functional Limits Lingual ROM: Within Functional Limits Lingual Symmetry: Within Functional Limits Lingual Strength:  Within Functional Limits Motor Speech Overall Motor Speech: Impaired Respiration: Within functional limits Phonation: Low vocal intensity Resonance: Within functional limits Articulation: Impaired Level of Impairment: Conversation Intelligibility: Intelligibility reduced Conversation: 75-100% accurate Motor Planning: Witnin functional limits Motor Speech Errors: Aware   GO                    Kern Reap, MA, CCC-SLP 04/29/2016, 12:33 PM [phone]

## 2016-04-29 NOTE — Progress Notes (Signed)
STROKE TEAM PROGRESS NOTE   SUBJECTIVE (INTERVAL HISTORY) Complains of back pain, pt is on prone position in bed. States she is allergic to codeine. Tylenol is not helping, on tramadol Q12.  MRI showed stable hematoma without expansion. BP still high, IM consulted and increased bisoprolol to 10mg .   OBJECTIVE Temp:  [97.9 F (36.6 C)-99.9 F (37.7 C)] 98.9 F (37.2 C) (01/13 1300) Pulse Rate:  [91-105] 97 (01/13 1300) Cardiac Rhythm: Normal sinus rhythm (01/13 1300) Resp:  [19-20] 20 (01/13 1300) BP: (122-177)/(63-94) 170/94 (01/13 1300) SpO2:  [96 %-99 %] 98 % (01/13 1300) Weight:  [53.9 kg (118 lb 12.8 oz)] 53.9 kg (118 lb 12.8 oz) (01/13 0500)  CBC:   Recent Labs Lab 04/28/16 0336 04/29/16 0254  WBC 9.8 10.9*  HGB 13.5 12.9  HCT 38.6 37.3  MCV 88.3 88.2  PLT 290 [id number]    Basic Metabolic Panel:  Recent Labs Lab 04/26/16 2052  04/27/16 1630 04/28/16 0336 04/29/16 0254  NA  --   < > 128*  --  127*  K  --   < > 3.8  --  3.6  CL  --   < > 96*  --  96*  CO2  --   < > 22  --  21*  GLUCOSE  --   < > 118*  --  106*  BUN  --   < > 18  --  23*  CREATININE  --   < > 0.76  --  0.86  CALCIUM  --   < > 9.4  --  9.2  MG 2.0  --   --  1.9  --   PHOS 2.4*  --   --  2.7  --   < > = values in this interval not displayed.  Lipid Panel:     Component Value Date/Time   CHOL 205 (H) 04/28/2016 0336   TRIG 72 04/28/2016 0336   HDL 60 04/28/2016 0336   CHOLHDL 3.4 04/28/2016 0336   VLDL 14 04/28/2016 0336   LDLCALC 131 (H) 04/28/2016 0336   HgbA1c:  Lab Results  Component Value Date   HGBA1C 5.7 (H) 04/28/2016    IMAGING I have personally reviewed the radiological images below and agree with the radiology interpretations.  Mri and Mra Head Wo Contrast 04/27/2016 Acute intraparenchymal hemorrhage as identified on CT, posterior LEFT lentiform nucleus and internal capsule, measuring 24 x 29 x 30 mm corresponding to a volume of 10.5 mL. Mild surrounding AVM without midline  shift. Widespread chronic microbleeds throughout the supratentorial and infratentorial compartment, along with extensive focal and confluent ischemic demyelination consistent with longstanding chronic hypertensive cerebrovascular disease. No intracranial vascular malformation, proximal stenosis, or dissection.   CT Head Wo Contrast 04/26/2016 Acute intraparenchymal hemorrhage in the left internal capsule region with mild surrounding edema. Underlying chronic atrophy and small vessel ischemia.  TTE - Left ventricle: The cavity size was normal. Wall thickness was   increased in a pattern of mild LVH. Systolic function was normal.   The estimated ejection fraction was in the range of 60% to 65%.   Wall motion was normal; there were no regional wall motion   abnormalities. Doppler parameters are consistent with abnormal   left ventricular relaxation (grade 1 diastolic dysfunction). Impressions: - No cardiac source of emboli was indentified.  CUS - Findings consistent with a high end 1- 39 percent stenosis involving the right internal carotid artery and the left internal carotid artery. Heterogeneous thyroid bilaterally.  PHYSICAL EXAM Frail petite elderly Caucasian lady in acute distress with back pain, lying in prone position. Anxious. Afebrile. Head is nontraumatic. Neck is supple without bruit. Cardiac exam no murmur or gallop. Lungs are clear to auscultation. Distal pulses are well felt.  Neurological Exam:  Awake, alert oriented x 3. Mildly dysarthric speech. Fundi not visualized. eye movements full without nystagmus. Vision acuity and fields appear normal. Hearing is normal. Palatal movements are normal. Face asymmetric with mild right lower facial weakness. Tongue midline. Normal strength, tone, reflexes and coordination except for mild right upper and lower extremity drift. Diminished fine finger movements on the right. No ataxia or dysmetria. Normal sensation. Gait not able to test due to  back pain.   ASSESSMENT/PLAN Patricia Beck is a 76 y.o. female who was admitted to [facility] with increasing back pain, anxiety, and hyponatremia who developed right sided weakness while ambulating to the bedside commode 04/26/2016. CT showed left basal ganglia/internal capsule hemorrhage.   ICH: Left basal ganglia/internal capsule hemorrhage secondary to hypertension   Resultant  mild right hemiparesis  CT - 04/26/2016 - Acute intraparenchymal hemorrhage in the left internal capsule region.  MRI - Acute intraparenchymal hemorrhage as identified on CT.  MRA - No intracranial vascular malformation, proximal stenosis, or dissection.   Carotid Doppler unremarkable   2D Echo - EF 60 - 65%. No cardiac source of emboli identified.  LDL 131  HgbA1c - 5.7  SCDs for VTE prophylaxis Diet regular Room service appropriate? Yes; Fluid consistency: Thin  No antithrombotic prior to admission, no antithrombotics due to [place]  Ongoing aggressive stroke risk factor management  Therapy recommendations:  SNF recommended.  Disposition:  pending                                                                                                                                                 Hypertensive emergency   Blood pressure as high as 204/101 in setting of neurologic symptoms.  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 Sensitive to lots of meds. Has only tried zoloft which  made her dizzy. Does not want xanax. Dizzy on minimal doses. On celexa 5 mg daily while hospitalized.   Hypothyroid, on Synthroid  Chest pain 2 months - troponin level [facility]  Hospital day # 2  Jezreel Justiniano  [name], MD PhD Stroke Neurology 04/29/2016 5:17 PM     To contact Stroke Continuity provider, please refer to [email or web address] After hours, contact General Neurology

## 2016-04-30 LAB — BASIC METABOLIC PANEL
ANION GAP: 11 (ref 5–15)
BUN: 17 mg/dL (ref 6–20)
CO2: 23 mmol/L (ref 22–32)
Calcium: 9.4 mg/dL (ref 8.9–10.3)
Chloride: 92 mmol/L — ABNORMAL LOW (ref 101–111)
Creatinine, Ser: 0.75 mg/dL (ref 0.44–1.00)
GFR calc non Af Amer: >60 mL/min (ref >60)
GLUCOSE: 121 mg/dL — AB (ref 65–99)
Potassium: 3.2 mmol/L — ABNORMAL LOW (ref 3.5–5.1)
Sodium: 126 mmol/L — ABNORMAL LOW (ref 135–145)

## 2016-04-30 LAB — VAS US CAROTID
LCCAPDIAS: 17 cm/s
LCCAPSYS: 114 cm/s
LEFT ECA DIAS: -10 cm/s
LEFT VERTEBRAL DIAS: 12 cm/s
LICADDIAS: -21 cm/s
LICADSYS: -80 cm/s
LICAPSYS: -75 cm/s
Left CCA dist dias: -17 cm/s
Left CCA dist sys: -97 cm/s
Left ICA prox dias: -25 cm/s
RCCADSYS: -89 cm/s
RIGHT ECA DIAS: 20 cm/s
RIGHT VERTEBRAL DIAS: -11 cm/s
Right CCA prox dias: -19 cm/s
Right CCA prox sys: -114 cm/s

## 2016-04-30 LAB — CBC
HEMATOCRIT: 39.7 % (ref 36.0–46.0)
Hemoglobin: 14.3 g/dL (ref 12.0–15.0)
MCH: 31.8 pg (ref 26.0–34.0)
MCHC: 36 g/dL (ref 30.0–36.0)
MCV: 88.2 fL (ref 78.0–100.0)
Platelets: 306 10*3/uL (ref 150–400)
RBC: 4.5 MIL/uL (ref 3.87–5.11)
RDW: 13.3 % (ref 11.5–15.5)
WBC: 10.4 10*3/uL (ref 4.0–10.5)

## 2016-04-30 MED ORDER — AMLODIPINE BESYLATE 5 MG PO TABS
5.0000 mg | ORAL_TABLET | Freq: Once | ORAL | Status: AC
Start: 2016-04-30 — End: 2016-04-30
  Administered 2016-04-30: 5 mg via ORAL
  Filled 2016-04-30: qty 1

## 2016-04-30 MED ORDER — POLYETHYLENE GLYCOL 3350 17 G PO PACK
17.0000 g | PACK | Freq: Every day | ORAL | Status: DC
  Administered 2016-04-30 – 2016-05-01 (×2): 17 g via ORAL
  Filled 2016-04-30 (×2): qty 1

## 2016-04-30 MED ORDER — AMLODIPINE BESYLATE 10 MG PO TABS
10.0000 mg | ORAL_TABLET | Freq: Every day | ORAL | Status: DC
Start: 2016-05-01 — End: 2016-05-01
  Administered 2016-05-01: 10 mg via ORAL
  Filled 2016-04-30: qty 1

## 2016-04-30 MED ORDER — POTASSIUM CHLORIDE 20 MEQ PO PACK
20.0000 meq | PACK | Freq: Two times a day (BID) | ORAL | Status: DC
  Administered 2016-04-30 – 2016-05-01 (×3): 20 meq via ORAL
  Filled 2016-04-30 (×4): qty 1

## 2016-04-30 NOTE — Consult Note (Signed)
PROGRESS NOTE [facility]   Patricia Beck   [id number] DOB: 1940/06/01  DOA: 04/26/2016 PCP: Pcp Not In System   Brief Narrative:  Patricia Beck is an 76 y.o. female  hypothyroidism, hypertension, anxiety, hyponatremia presented to [facility] with complaining of chest pain, back pain and anxiety, felt that was secondary to anxiety as troponin and EKG shows no change S I ADH versus polydipsia. She was placed on fluid restriction and monitor. One day after admission 1/11 patient was noted to have significant elevated blood pressure. She was place in the stepdown unit with the antihypertensive medication. She was noted to have acute onset of right facial droop right upper extremity weakness, code stroke was called, CT done emergently showed a medium-sized acute left basal ganglia and internal capsule hemorrhage she was transferred to the neurology service at [facility] for further management. Critical care was following the patient for hypertensive urgency and as TRH to take over for further monitoring.  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 hemorrhage secondary to uncontrolled hypertension Mild right hemiparesis Continue management per neurology team  Awaiting for CIR   Hypertensive emergency - blood pressure peaked at 204/101  Was treated with Cardene drip - d/ced BP improved but not at goal - On Norvasc 5 mg and Zebeta 10 mg - will increase Norvasc to 10 mg  Systolic blood pressure goal is less than 160 Hydralazine when necessary for SSBP is above 150 Continue to monitor blood pressure  Chronic back pain due to a scoliosis - Per patient surgery is not an option  Added tramadol - no real allergy to this medication patient reported she felt funny Continue Tylenol when necessary If pain not well controlled can consider adding Percocet - patient will need pain management as outpatient   Anxiety Started on Celexa by  neurologist 5 mg increased to 10 mg, somewhat working per patient.  Hypothyroidism - elevated TSH this could be also contributing to hyponatremia  Will increase Amour  Check free T4  Check TSH in 6 weeks   Hyponatremia secondary to SIADH, thyroid could also be contributing  See above   Thank you for this consultation.  Our [facility] hospitalist team will follow the patient with you.  Objective: Vitals:   04/30/16 0117 04/30/16 0500 04/30/16 0626 04/30/16 1020  BP: (!) 152/66  (!) 149/94 (!) 157/80  Pulse: 86  (!) 105 96  Resp: 16  16 18   Temp: 97.8 F (36.6 C)  97.4 F (36.3 C) 97.7 F (36.5 C)  TempSrc: Oral  Oral Oral  SpO2: 96%  96% 98%  Weight:  52.8 kg (116 lb 4.8 oz)    Height:        Intake/Output Summary (Last 24 hours) at 04/30/16 1340 Last data filed at 04/30/16 0640  Gross per 24 hour  Intake                0 ml  Output             1250 ml  Net            -1250 ml   Filed Weights   04/28/16 0422 04/29/16 0500 04/30/16 0500  Weight: 47.7 kg (105 lb 2.6 oz) 53.9 kg (118 lb 12.8 oz) 52.8 kg (116 lb 4.8 oz)    Examination: Constitutional  Frail, elderly, NAD  CVS: S1S2 1/6 Sm, no pedal edema   Respiratory:  CTA b/l  Neuro: Mild dysarthria,  Asymmetric face with lower facial weakness, Strength 4/5 RLE Psych: More calm today    Data Reviewed: I have personally reviewed following labs and imaging studies  CBC:  Recent Labs Lab 04/26/16 1139 04/28/16 0336 04/29/16 0254 04/30/16 0449  WBC 10.7* 9.8 10.9* 10.4  HGB 13.1 13.5 12.9 14.3  HCT 36.9 38.6 37.3 39.7  MCV 87.0 88.3 88.2 88.2  PLT 302 290 327 [id number]   Basic Metabolic Panel:  Recent Labs Lab 04/26/16 2052  04/27/16 1123 04/27/16 1421 04/27/16 1630 04/28/16 0336 04/29/16 0254 04/30/16 0449  NA  --   < > 128* 128* 128*  --  127* 126*  K  --   < > 3.6 3.8 3.8  --  3.6 3.2*  CL  --   < > 98* 98* 96*  --  96* 92*  CO2  --   < > 21* 21* 22  --  21* 23  GLUCOSE  --   < > 159* 133* 118*  --   106* 121*  BUN  --   < > 13 14 18   --  23* 17  CREATININE  --   < > 0.68 0.65 0.76  --  0.86 0.75  CALCIUM  --   < > 9.1 9.3 9.4  --  9.2 9.4  MG 2.0  --   --   --   --  1.9  --   --   PHOS 2.4*  --   --   --   --  2.7  --   --   < > = values in this interval not displayed. GFR: Estimated Creatinine Clearance: 48.8 mL/min (by C-G formula based on SCr of 0.75 mg/dL). Liver Function Tests: No results for input(s): AST, ALT, ALKPHOS, BILITOT, PROT, ALBUMIN in the last 168 hours. No results for input(s): LIPASE, AMYLASE in the last 168 hours. No results for input(s): AMMONIA in the last 168 hours. Coagulation Profile: No results for input(s): INR, PROTIME in the last 168 hours. Cardiac Enzymes:  Recent Labs Lab 04/26/16 2052  TROPONINI <0.03   BNP (last 3 results) No results for input(s): PROBNP in the last 8760 hours. HbA1C:  Recent Labs  04/28/16 0336  HGBA1C 5.7*   CBG:  Recent Labs Lab 04/26/16 2128  GLUCAP 150*   Lipid Profile:  Recent Labs  04/28/16 0336  CHOL 205*  HDL 60  LDLCALC 131*  TRIG 72  CHOLHDL 3.4   Thyroid Function Tests: No results for input(s): TSH, T4TOTAL, FREET4, T3FREE, THYROIDAB in the last 72 hours. Anemia Panel: No results for input(s): VITAMINB12, FOLATE, FERRITIN, TIBC, IRON, RETICCTPCT in the last 72 hours. Sepsis Labs: No results for input(s): PROCALCITON, LATICACIDVEN in the last 168 hours.  Recent Results (from the past 240 hour(s))  MRSA PCR Screening     Status: None   Collection Time: 04/26/16 11:59 PM  Result Value Ref Range Status   MRSA by PCR NEGATIVE NEGATIVE Final    Comment:        The GeneXpert MRSA Assay (FDA approved for NASAL specimens only), is one component of a comprehensive MRSA colonization surveillance program. It is not intended to diagnose MRSA infection nor to guide or monitor treatment for MRSA infections.       Radiology Studies: No results found.    Scheduled Meds: . amLODipine  5  mg Oral Daily  . bisoprolol  10 mg Oral Daily  . calcium citrate  200 mg of elemental calcium Oral Daily  .  cholecalciferol  5,000 Units Oral Daily  . citalopram  10 mg Oral Daily  . polyethylene glycol  17 g Oral Daily  . pravastatin  40 mg Oral q1800  . senna-docusate  2 tablet Oral BID  . thyroid  90 mg Oral QAC breakfast  . vitamin C  1,000 mg Oral Daily   Continuous Infusions:   LOS: 3 days     Chipper Oman, MD [facility] Pager [phone]  If 7PM-7AM, please contact night-coverage [email or web address] Password [email or web address] 04/30/2016, 1:40 PM

## 2016-04-30 NOTE — Progress Notes (Signed)
STROKE TEAM PROGRESS NOTE   SUBJECTIVE (INTERVAL HISTORY) No family at bedside. Still complains of back pain, but somewhat improved. BP improved, on amlodipine 10 mg and bisoprolol to 10mg .    OBJECTIVE Temp:  [97.4 F (36.3 C)-98 F (36.7 C)] 97.7 F (36.5 C) (01/14 1020) Pulse Rate:  [82-105] 96 (01/14 1020) Cardiac Rhythm: Normal sinus rhythm (01/14 0900) Resp:  [16-18] 18 (01/14 1020) BP: (131-169)/(66-94) 157/80 (01/14 1020) SpO2:  [96 %-99 %] 98 % (01/14 1020) Weight:  [52.8 kg (116 lb 4.8 oz)] 52.8 kg (116 lb 4.8 oz) (01/14 0500)  CBC:   Recent Labs Lab 04/29/16 0254 04/30/16 0449  WBC 10.9* 10.4  HGB 12.9 14.3  HCT 37.3 39.7  MCV 88.2 88.2  PLT 327 [id number]    Basic Metabolic Panel:  Recent Labs Lab 04/26/16 2052  04/28/16 0336 04/29/16 0254 04/30/16 0449  NA  --   < >  --  127* 126*  K  --   < >  --  3.6 3.2*  CL  --   < >  --  96* 92*  CO2  --   < >  --  21* 23  GLUCOSE  --   < >  --  106* 121*  BUN  --   < >  --  23* 17  CREATININE  --   < >  --  0.86 0.75  CALCIUM  --   < >  --  9.2 9.4  MG 2.0  --  1.9  --   --   PHOS 2.4*  --  2.7  --   --   < > = values in this interval not displayed.  Lipid Panel:     Component Value Date/Time   CHOL 205 (H) 04/28/2016 0336   TRIG 72 04/28/2016 0336   HDL 60 04/28/2016 0336   CHOLHDL 3.4 04/28/2016 0336   VLDL 14 04/28/2016 0336   LDLCALC 131 (H) 04/28/2016 0336   HgbA1c:  Lab Results  Component Value Date   HGBA1C 5.7 (H) 04/28/2016    IMAGING I have personally reviewed the radiological images below and agree with the radiology interpretations.  Mri and Mra Head Wo Contrast 04/27/2016 Acute intraparenchymal hemorrhage as identified on CT, posterior LEFT lentiform nucleus and internal capsule, measuring 24 x 29 x 30 mm corresponding to a volume of 10.5 mL. Mild surrounding AVM without midline shift. Widespread chronic microbleeds throughout the supratentorial and infratentorial compartment, along with  extensive focal and confluent ischemic demyelination consistent with longstanding chronic hypertensive cerebrovascular disease. No intracranial vascular malformation, proximal stenosis, or dissection.   CT Head Wo Contrast 04/26/2016 Acute intraparenchymal hemorrhage in the left internal capsule region with mild surrounding edema. Underlying chronic atrophy and small vessel ischemia.  TTE - Left ventricle: The cavity size was normal. Wall thickness was   increased in a pattern of mild LVH. Systolic function was normal.   The estimated ejection fraction was in the range of 60% to 65%.   Wall motion was normal; there were no regional wall motion   abnormalities. Doppler parameters are consistent with abnormal   left ventricular relaxation (grade 1 diastolic dysfunction). Impressions: - No cardiac source of emboli was indentified.  CUS - Findings consistent with a high end 1- 39 percent stenosis involving the right internal carotid artery and the left internal carotid artery. Heterogeneous thyroid bilaterally.   PHYSICAL EXAM Frail petite elderly Caucasian lady in acute distress due to anxiety. Afebrile. Head is nontraumatic.  Neck is supple without bruit. Cardiac exam no murmur or gallop. Lungs are clear to auscultation. Distal pulses are well felt.  Neurological Exam:  Awake, alert oriented x 3. Mildly dysarthric speech. Fundi not visualized. eye movements full without nystagmus. Vision acuity and fields appear normal. Hearing is normal. Palatal movements are normal. Face asymmetric with mild right lower facial weakness. Tongue midline. Normal strength, tone, reflexes and coordination except for mild right upper and lower extremity drift. Diminished fine finger movements on the right. No ataxia or dysmetria. Normal sensation. Gait not able to test due to back pain.   ASSESSMENT/PLAN Patricia Beck is a 76 y.o. female who was admitted to [facility] with increasing back pain, anxiety,  and hyponatremia who developed right sided weakness while ambulating to the bedside commode 04/26/2016. CT showed left basal ganglia/internal capsule hemorrhage.   ICH: Left basal ganglia/internal capsule hemorrhage secondary to hypertension   Resultant  mild right hemiparesis  CT - 04/26/2016 - Acute intraparenchymal hemorrhage in the left internal capsule region.  MRI - Acute intraparenchymal hemorrhage as identified on CT.  MRA - No intracranial vascular malformation, proximal stenosis, or dissection.   Carotid Doppler unremarkable   2D Echo - EF 60 - 65%. No cardiac source of emboli identified.  LDL 131  HgbA1c - 5.7  SCDs for VTE prophylaxis Diet regular Room service appropriate? Yes; Fluid consistency: Thin  No antithrombotic prior to admission, no antithrombotics due to [place]  Ongoing aggressive stroke risk factor management  Therapy recommendations:  SNF recommended.  Disposition:  pending                                                                                                                                                 Hypertensive emergency   Blood pressure as high as 204/101 in setting of neurologic symptoms.  Treated with Cardene drip, now off  Blood pressure improved  BP goal < 160   Now on Norvasc 10, Zebeta 10   Internal medicine on board, appreciate help  Chronic back pain  Allergy to codeine  On tramadol Q12h PRN now  May consider narcortics if not effective  Internal medicine on board, appreciate help  Anxiety  increased at home per son.   Sensitive to lots of meds.   Has only tried zoloft which made her dizzy.   Does not want xanax. Dizzy on minimal doses.   Increase celexa to 10 mg daily.   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 After hours, contact General Neurology

## 2016-04-30 NOTE — Progress Notes (Signed)
Assisted pt to [facility]. Pt didn't stand up all the way and was trying to sit down on [facility] when her right leg give in and she slid down slowly on her right hip to the ground assisted. Pt was assisted back up to the [facility]. VSS. Denies any pain. No distress noted. No bruises noted. MD made aware with no new order.

## 2016-05-01 DIAGNOSIS — R079 Chest pain, unspecified: Secondary | ICD-10-CM

## 2016-05-01 DIAGNOSIS — N8189 Other female genital prolapse: Secondary | ICD-10-CM | POA: Diagnosis not present

## 2016-05-01 DIAGNOSIS — R278 Other lack of coordination: Secondary | ICD-10-CM | POA: Diagnosis not present

## 2016-05-01 DIAGNOSIS — R488 Other symbolic dysfunctions: Secondary | ICD-10-CM | POA: Diagnosis not present

## 2016-05-01 DIAGNOSIS — G4700 Insomnia, unspecified: Secondary | ICD-10-CM | POA: Diagnosis not present

## 2016-05-01 DIAGNOSIS — E039 Hypothyroidism, unspecified: Secondary | ICD-10-CM | POA: Diagnosis not present

## 2016-05-01 DIAGNOSIS — Z20828 Contact with and (suspected) exposure to other viral communicable diseases: Secondary | ICD-10-CM | POA: Diagnosis not present

## 2016-05-01 DIAGNOSIS — S06351D Traumatic hemorrhage of left cerebrum with loss of consciousness of 30 minutes or less, subsequent encounter: Secondary | ICD-10-CM | POA: Diagnosis not present

## 2016-05-01 DIAGNOSIS — I69351 Hemiplegia and hemiparesis following cerebral infarction affecting right dominant side: Secondary | ICD-10-CM | POA: Diagnosis not present

## 2016-05-01 DIAGNOSIS — F411 Generalized anxiety disorder: Secondary | ICD-10-CM

## 2016-05-01 DIAGNOSIS — F329 Major depressive disorder, single episode, unspecified: Secondary | ICD-10-CM | POA: Diagnosis not present

## 2016-05-01 DIAGNOSIS — I610 Nontraumatic intracerebral hemorrhage in hemisphere, subcortical: Secondary | ICD-10-CM | POA: Diagnosis not present

## 2016-05-01 DIAGNOSIS — F5101 Primary insomnia: Secondary | ICD-10-CM | POA: Diagnosis not present

## 2016-05-01 DIAGNOSIS — E034 Atrophy of thyroid (acquired): Secondary | ICD-10-CM | POA: Diagnosis not present

## 2016-05-01 DIAGNOSIS — E222 Syndrome of inappropriate secretion of antidiuretic hormone: Secondary | ICD-10-CM | POA: Diagnosis not present

## 2016-05-01 DIAGNOSIS — R471 Dysarthria and anarthria: Secondary | ICD-10-CM | POA: Diagnosis not present

## 2016-05-01 DIAGNOSIS — E876 Hypokalemia: Secondary | ICD-10-CM | POA: Diagnosis not present

## 2016-05-01 DIAGNOSIS — N390 Urinary tract infection, site not specified: Secondary | ICD-10-CM | POA: Diagnosis not present

## 2016-05-01 DIAGNOSIS — M549 Dorsalgia, unspecified: Secondary | ICD-10-CM | POA: Diagnosis not present

## 2016-05-01 DIAGNOSIS — G8929 Other chronic pain: Secondary | ICD-10-CM | POA: Diagnosis not present

## 2016-05-01 DIAGNOSIS — E559 Vitamin D deficiency, unspecified: Secondary | ICD-10-CM | POA: Diagnosis not present

## 2016-05-01 DIAGNOSIS — I161 Hypertensive emergency: Secondary | ICD-10-CM | POA: Diagnosis not present

## 2016-05-01 DIAGNOSIS — R339 Retention of urine, unspecified: Secondary | ICD-10-CM | POA: Diagnosis not present

## 2016-05-01 DIAGNOSIS — E871 Hypo-osmolality and hyponatremia: Secondary | ICD-10-CM | POA: Diagnosis not present

## 2016-05-01 DIAGNOSIS — R262 Difficulty in walking, not elsewhere classified: Secondary | ICD-10-CM | POA: Diagnosis not present

## 2016-05-01 DIAGNOSIS — B952 Enterococcus as the cause of diseases classified elsewhere: Secondary | ICD-10-CM | POA: Diagnosis not present

## 2016-05-01 DIAGNOSIS — M6281 Muscle weakness (generalized): Secondary | ICD-10-CM | POA: Diagnosis not present

## 2016-05-01 DIAGNOSIS — Z7189 Other specified counseling: Secondary | ICD-10-CM | POA: Diagnosis not present

## 2016-05-01 DIAGNOSIS — F419 Anxiety disorder, unspecified: Secondary | ICD-10-CM | POA: Diagnosis not present

## 2016-05-01 LAB — CBC
HEMATOCRIT: 38.2 % (ref 36.0–46.0)
HEMOGLOBIN: 13.5 g/dL (ref 12.0–15.0)
MCH: 31.3 pg (ref 26.0–34.0)
MCHC: 35.3 g/dL (ref 30.0–36.0)
MCV: 88.6 fL (ref 78.0–100.0)
Platelets: 292 10*3/uL (ref 150–400)
RBC: 4.31 MIL/uL (ref 3.87–5.11)
RDW: 13.4 % (ref 11.5–15.5)
WBC: 10.7 10*3/uL — ABNORMAL HIGH (ref 4.0–10.5)

## 2016-05-01 LAB — BASIC METABOLIC PANEL
Anion gap: 10 (ref 5–15)
BUN: 18 mg/dL (ref 4–21)
BUN: 18 mg/dL (ref 6–20)
CHLORIDE: 94 mmol/L — AB (ref 101–111)
CO2: 25 mmol/L (ref 22–32)
CREATININE: 0.6 mg/dL (ref 0.5–1.1)
CREATININE: 0.64 mg/dL (ref 0.44–1.00)
Calcium: 9.4 mg/dL (ref 8.9–10.3)
GFR calc Af Amer: >60 mL/min (ref >60)
GFR calc non Af Amer: >60 mL/min (ref >60)
GLUCOSE: 114 mg/dL
GLUCOSE: 114 mg/dL — AB (ref 65–99)
POTASSIUM: 3.5 mmol/L (ref 3.5–5.1)
SODIUM: 129 mmol/L — AB (ref 137–147)
Sodium: 129 mmol/L — ABNORMAL LOW (ref 135–145)

## 2016-05-01 LAB — CBC AND DIFFERENTIAL: WBC: 10.7 10^3/mL

## 2016-05-01 MED ORDER — BISOPROLOL FUMARATE 10 MG PO TABS: 10.0000 mg | ORAL_TABLET | Freq: Every day | ORAL | 2 refills | Status: AC

## 2016-05-01 MED ORDER — ZOLPIDEM TARTRATE 5 MG PO TABS: 5.0000 mg | ORAL_TABLET | Freq: Every day | ORAL | Status: DC

## 2016-05-01 MED ORDER — ZOLPIDEM TARTRATE 5 MG PO TABS: 5.0000 mg | ORAL_TABLET | Freq: Every day | ORAL | 0 refills | Status: DC

## 2016-05-01 MED ORDER — ZOLPIDEM TARTRATE 5 MG PO TABS: 5.0000 mg | ORAL_TABLET | Freq: Every evening | ORAL | Status: DC | PRN

## 2016-05-01 MED ORDER — SENNOSIDES-DOCUSATE SODIUM 8.6-50 MG PO TABS: 2.0000 | ORAL_TABLET | Freq: Two times a day (BID) | ORAL | 2 refills | Status: DC

## 2016-05-01 MED ORDER — POLYETHYLENE GLYCOL 3350 17 G PO PACK: 17.0000 g | PACK | Freq: Every day | ORAL | 0 refills | Status: DC

## 2016-05-01 MED ORDER — POTASSIUM CHLORIDE 20 MEQ PO PACK: 20.0000 meq | PACK | Freq: Two times a day (BID) | ORAL | 0 refills | Status: AC

## 2016-05-01 MED ORDER — TRAMADOL HCL 50 MG PO TABS: 50.0000 mg | ORAL_TABLET | Freq: Two times a day (BID) | ORAL | 1 refills | Status: DC | PRN

## 2016-05-01 MED ORDER — CITALOPRAM HYDROBROMIDE 10 MG PO TABS: 10.0000 mg | ORAL_TABLET | Freq: Every day | ORAL | 2 refills | Status: AC

## 2016-05-01 MED ORDER — AMLODIPINE BESYLATE 10 MG PO TABS: 10.0000 mg | ORAL_TABLET | Freq: Every day | ORAL | 2 refills | Status: AC

## 2016-05-01 MED ORDER — THYROID 90 MG PO TABS: 90.0000 mg | ORAL_TABLET | Freq: Every day | ORAL | 2 refills | Status: DC

## 2016-05-01 NOTE — Progress Notes (Signed)
Physical Therapy Treatment Patient Details Name: Patricia Beck MRN: [id number] DOB: 09-19-1940 Today's Date: 05/01/2016    History of Present Illness Patient is a 76 y/o female with hx of HTN, hypothyrodism, chronic back pain, cataract presents with chest pain secondary to anxiety. When transferring to [facility], pt with onset of right sided weakness and slurred speech. Code stroke initiated. Head CT-medium-sized acute left basal ganglia and internal capsule hemorrhage.    PT Comments    Continuing work on gait and balance deficits; Pt is anxious and distractible, but participates fully in PT session; We discussed the need for practice, and that she does have good rehab potential; Able to use RW well, incr amb distance  Follow Up Recommendations  SNF     Equipment Recommendations  None recommended by PT    Recommendations for Other Services       Precautions / Restrictions Precautions Precautions: Fall Restrictions Weight Bearing Restrictions: No    Mobility  Bed Mobility Overal bed mobility: Needs Assistance Bed Mobility: Supine to Sit     Supine to sit: Min assist     General bed mobility comments: Min assist to square off hips at EOB  Transfers Overall transfer level: Needs assistance Equipment used: Rolling walker (2 wheeled) Transfers: Sit to/from Stand Sit to Stand: Min assist         General transfer comment: Assist to power to standing. No dizziness reported.   Ambulation/Gait Ambulation/Gait assistance: Min assist;Mod assist Ambulation Distance (Feet): 80 Feet Assistive device: Rolling walker (2 wheeled) Gait Pattern/deviations: Step-through pattern;Decreased stride length;Decreased step length - right;Decreased step length - left;Narrow base of support Gait velocity: decreased   General Gait Details: Able to support self on RW; noting occasional buckling  RLE and decreasing RLE step length with greater distance and therefore with more fatigue; mod assist  to prevent fall with return to room   Stairs            Wheelchair Mobility    Modified Rankin (Stroke Patients Only) Modified Rankin (Stroke Patients Only) Pre-Morbid Rankin Score: No significant disability Modified Rankin: Moderately severe disability     Balance     Sitting balance-Leahy Scale: Fair       Standing balance-Leahy Scale: Poor                      Cognition Arousal/Alertness: Awake/alert Behavior During Therapy: WFL for tasks assessed/performed Overall Cognitive Status:  (Continues to be easily distracted)                      Exercises      General Comments        Pertinent Vitals/Pain Pain Assessment: Faces Faces Pain Scale: Hurts little more Pain Location: back  Pain Descriptors / Indicators: Sore;Aching (reports this is chronic) Pain Intervention(s): Monitored during session;Repositioned    Home Living                      Prior Function            PT Goals (current goals can now be found in the care plan section) Acute Rehab PT Goals Patient Stated Goal: to go to rehab before going home PT Goal Formulation: With patient Time For Goal Achievement: 05/12/16 Potential to Achieve Goals: Good Progress towards PT goals: Progressing toward goals    Frequency    Min 4X/week      PT Plan Current plan remains appropriate  Co-evaluation             End of Session Equipment Utilized During Treatment: Gait belt Activity Tolerance: Patient tolerated treatment well Patient left: in chair;with call bell/phone within reach;with chair alarm set     Time: [id number] PT Time Calculation (min) (ACUTE ONLY): 25 min  Charges:  $Gait Training: 23-37 mins                    G Codes:      Colletta [place] 2016-05-03, 12:34 PM  Roney Marion, [place] Pager [phone] Office [phone]

## 2016-05-01 NOTE — Discharge Summary (Signed)
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  Past Medical History:  Diagnosis Date  . Cataract   . Fibroma 2006  . Hypertension   . Thyroid disease    Past Surgical History:  Procedure Laterality Date  . EYE SURGERY    . fibroma      Allergies as of 05/01/2016      Reactions   Aciphex [rabeprazole Sodium]    Aleve [naproxen Sodium]    Atarax [hydroxyzine]    Belsomra [suvorexant] Other (See Comments)   Nightmares   Buspirone    Codeine    Erythromycin    Keflex [cephalexin]    Lidocaine    Prednisone    Insomnia, nausea, hot flush   Tramadol    Zoloft [sertraline Hcl]       Medication List    STOP taking these medications   Melatonin 3 MG Tabs     TAKE these medications   acetaminophen 500 MG tablet Commonly known as:  TYLENOL Take 1,000 mg by mouth every 6 (six) hours as needed for moderate pain (pain).   amLODipine 10 MG tablet Commonly known as:  NORVASC Take 1 tablet (10 mg total) by mouth daily. What changed:  medication strength  how much to take   bisoprolol 10 MG tablet Commonly known as:  ZEBETA Take 1 tablet (10 mg total) by mouth daily. What changed:  medication strength  how much to take   calcium citrate 950 MG tablet Commonly known as:  CALCITRATE - dosed in mg elemental calcium Take 200 mg of elemental  calcium by mouth daily.   cholecalciferol 1000 units tablet Commonly known as:  VITAMIN D Take 5,000 Units by mouth daily.   citalopram 10 MG tablet Commonly known as:  CELEXA Take 1 tablet (10 mg total) by mouth daily. Start taking on:  05/02/2016   polyethylene glycol packet Commonly known as:  MIRALAX / GLYCOLAX Take 17 g by mouth daily. Start taking on:  05/02/2016   potassium chloride 20 MEQ packet Commonly known as:  KLOR-CON Take 20 mEq by mouth 2 (two) times daily.   senna-docusate 8.6-50 MG tablet Commonly known as:  Senokot-S Take 2 tablets by mouth 2 (two) times daily.   thyroid 90 MG tablet Commonly known as:  ARMOUR Take 1 tablet (90 mg total) by mouth daily before breakfast. Start taking on:  05/02/2016 What changed:  medication strength  how much to take   traMADol 50 MG tablet Commonly known as:  ULTRAM Take 1 tablet (50 mg total) by mouth every 12 (twelve) hours as needed for moderate pain.   vitamin C 1000 MG tablet Take 1,000 mg by mouth daily.   vitamin E 100 UNIT capsule Take by mouth daily.   zolpidem 5 MG tablet Commonly known as:  AMBIEN Take 1 tablet (  5 mg total) by mouth at bedtime. What changed:  when to take this  reasons to take this       LABORATORY STUDIES CBC    Component Value Date/Time   WBC 10.7 (H) 05/01/2016 0443   RBC 4.31 05/01/2016 0443   HGB 13.5 05/01/2016 0443   HCT 38.2 05/01/2016 0443   PLT 292 05/01/2016 0443   MCV 88.6 05/01/2016 0443   MCV 91.8 01/03/2016 1500   MCH 31.3 05/01/2016 0443   MCHC 35.3 05/01/2016 0443   RDW 13.4 05/01/2016 0443   LYMPHSABS 1.1 07/30/2014 1646   MONOABS 1.3 (H) 07/30/2014 1646   EOSABS 0.0 07/30/2014 1646   BASOSABS 0.0 07/30/2014 1646   CMP    Component Value Date/Time   NA 129 (L) 05/01/2016 0443   K 3.5 05/01/2016 0443   CL 94 (L) 05/01/2016 0443   CO2 25 05/01/2016 0443   GLUCOSE 114 (H) 05/01/2016 0443   BUN 18 05/01/2016 0443   CREATININE 0.64 05/01/2016  0443   CALCIUM 9.4 05/01/2016 0443   PROT 7.0 07/30/2014 1646   ALBUMIN 3.5 07/30/2014 1646   AST 36 07/30/2014 1646   ALT 21 07/30/2014 1646   ALKPHOS 55 07/30/2014 1646   BILITOT 1.0 07/30/2014 1646   GFRNONAA >60 05/01/2016 0443   GFRAA >60 05/01/2016 0443   COAGS Lab Results  Component Value Date   INR 1.05 07/30/2014   INR 0.9 12/11/2008   Lipid Panel    Component Value Date/Time   CHOL 205 (H) 04/28/2016 0336   TRIG 72 04/28/2016 0336   HDL 60 04/28/2016 0336   CHOLHDL 3.4 04/28/2016 0336   VLDL 14 04/28/2016 0336   LDLCALC 131 (H) 04/28/2016 0336   HgbA1C  Lab Results  Component Value Date   HGBA1C 5.7 (H) 04/28/2016   Cardiac Panel (last 3 results) No results for input(s): CKTOTAL, CKMB, TROPONINI, RELINDX in the last 72 hours. Urinalysis    Component Value Date/Time   COLORURINE COLORLESS (A) 04/26/2016 1702   APPEARANCEUR CLEAR 04/26/2016 1702   LABSPEC 1.004 (L) 04/26/2016 1702   PHURINE 9.0 (H) 04/26/2016 1702   GLUCOSEU NEGATIVE 04/26/2016 1702   HGBUR NEGATIVE 04/26/2016 1702   BILIRUBINUR NEGATIVE 04/26/2016 1702   BILIRUBINUR negative 01/03/2016 1501   KETONESUR NEGATIVE 04/26/2016 1702   PROTEINUR NEGATIVE 04/26/2016 1702   UROBILINOGEN 0.2 01/03/2016 1501   UROBILINOGEN 0.2 07/30/2014 1728   NITRITE NEGATIVE 04/26/2016 1702   LEUKOCYTESUR NEGATIVE 04/26/2016 1702   Urine Drug Screen No results found for: LABOPIA, COCAINSCRNUR, LABBENZ, AMPHETMU, THCU, LABBARB  Alcohol Level No results found for: [facility]   SIGNIFICANT DIAGNOSTIC STUDIES CT Head Wo Contrast 04/26/2016 Acute intraparenchymal hemorrhage in the left internal capsule region with mild surrounding edema. Underlying chronic atrophy and small vessel ischemia.  Mri and Mra Head Wo Contrast 04/27/2016 Acute intraparenchymal hemorrhage as identified on CT, posterior LEFT lentiform nucleus and internal capsule, measuring 24 x 29 x 30 mm corresponding to a volume of 10.5 mL. Mild  surrounding AVM without midline shift. Widespread chronic microbleeds throughout the supratentorial and infratentorial compartment, along with extensive focal and confluent ischemic demyelination consistent with longstanding chronic hypertensive cerebrovascular disease. No intracranial vascular malformation, proximal stenosis, or dissection.   CUS - Findings consistent with a high end 1- 39 percent stenosis involving the right internal carotid artery and the left internal carotid artery. Heterogeneous thyroid bilaterally.  TTE  - Left ventricle: The cavity size was normal. Wall thickness wasincreased in a pattern  of mild LVH. Systolic function was normal.The estimated ejection fraction was in the range of 60% to 65%.Wall motion was normal; there were no regional wall motionabnormalities. Doppler parameters are consistent with abnormalleft ventricular relaxation (grade 1 diastolic dysfunction). Impressions:   No cardiac source of emboli was indentified.     HISTORY OF PRESENT ILLNESS Sherricka Vangrinsven [name] an 76 y.o.femalewho initially presented to [facility] with chief complaints of increasing back pain and anxiety. On initial evaluation by hospitalist team her chest pain was felt likely to be secondary to anxiety as her troponin was negative and her EKG was unchanged from previous. 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 She denied any SOB, diaphoresis, abdominal pain, vomiting, headache, dizziness or nausea on initial evaluation at [facility] on Wednesday 1/11. She was noted to have significantly elevate BP with SBP readings of 156-198 and DBP ranging between 82 and 119.   She was admitted to the stepdown unit with PRN antihypertensives and IVF. While ambulating to her bedside commode, acute onset of right  facial droop and RUE weakness was noted. A Code Stroke was called and she was sent for emergent CT, which showed a medium-sized acute left basal ganglia and internal capsule hemorrhage. She was transferred to the [facility] ICU for further management. She has been transferred to the Neurology service from the Hospitalist service with the ICU physician team consulting.     HOSPITAL COURSE Ms. CARRIEANN GILVIN is a 76 y.o. female who was admitted to [facility] with increasing back pain, anxiety, and hyponatremia who developed right sided weakness while ambulating to the bedside commode 04/26/2016. CT showed left basal ganglia/internal capsule hemorrhage.   ICH: Left basal ganglia/internal capsule hemorrhage secondary to hypertension   Resultant  mild right hemiparesis  CT - 04/26/2016 - Acute intraparenchymal hemorrhage in the left internal capsule region.  MRI - Acute intraparenchymal hemorrhage as identified on CT.  MRA - No intracranial vascular malformation, proximal stenosis, or dissection.   Carotid Doppler unremarkable   2D Echo - EF 60 - 65%. No cardiac source of emboli identified.  LDL 131  HgbA1c - 5.7  No antithrombotic prior to admission, no antithrombotics due to [place]  Ongoing aggressive stroke risk factor management  Therapy recommendations:  SNF [facility]  Disposition:  SNF                                                                                                                                                Hypertensive emergency   Blood pressure as high as 204/101 in setting of neurologic symptoms.  Treated with Cardene drip in hospital  Blood pressure improved  BP goal < 160  In hospital  on Norvasc 10, Zebeta 10   BP goal normotensive              Chronic back pain  Secondary to scoliosis  Allergy to codeine  On tramadol Q12h PRN and tylenol prn now   Need chronic pain management as an outpatient  Anxiety  increased at home   Sensitive  to lots of meds.   Has only tried zoloft which made her dizzy.   Does not want xanax. Dizzy on minimal doses.   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 Dose increased. Could be contribution to hyponatremia  Chest pain 2 months - troponin level WNL  Hypokalemia, resolved after supplement - 3.2  -> 3.5   DISCHARGE EXAM Blood pressure 132/76, pulse 74, temperature 98 F (36.7 C), temperature source Oral, resp. rate 16, height 5\' 2"  (1.575 m), weight 52.8 kg (116 lb 4.8 oz), SpO2 100 %. Frail petite elderly Caucasian lady in acute distress due to anxiety. Afebrile. Head is nontraumatic. Neck is supple without bruit. Cardiac exam no murmur or gallop. Lungs are clear to auscultation. Distal pulses are well felt.  Neurological Exam:  Awake, alert oriented x 3. Mildly dysarthric speech. Fundi not visualized. eye movements full without nystagmus. Vision acuity and fields appear normal. Hearing is normal. Palatal movements are normal. Face asymmetric with mild right lower facial weakness. Tongue midline. Normal strength, tone, reflexes and coordination except for mild right upper and lower extremity drift. Diminished fine finger movements on the right. No ataxia or dysmetria. Normal sensation. Gait not able to test due to back pain.   Discharge Diet   Diet regular Room service appropriate? Yes; Fluid consistency: Thin liquids  DISCHARGE PLAN  Disposition:  Discharge to skilled nursing facility for ongoing PT, OT and ST.   No aspirin or aspirin containing products at this time given hemorrhage  Ongoing risk factor control by Primary Care Physician at time of discharge  Follow-up PCP in 2 weeks.  Follow-up with Dr. Leonie Man, [facility] in 6  weeks, office to schedule an appointment.  45 minutes were spent preparing discharge.  Rosalin Hawking, MD PhD Stroke Neurology 05/01/2016 7:04 PM

## 2016-05-01 NOTE — Care Management Important Message (Signed)
Important Message  Patient Details  Name: Patricia Beck MRN: [id number] Date of Birth: 13-May-1940   Medicare Important Message Given:  Yes    [name] 05/01/2016, 4:44 PM

## 2016-05-01 NOTE — Care Management Note (Signed)
Case Management Note  Patient Details  Name: Patricia Beck MRN: [id number] Date of Birth: Sep 07, 1940  Subjective/Objective:              Patient was admitted with CVA. Lives at home alone. Plan is for SNF at discharge. CM will follow for discharge needs pending discharge disposition.     Action/Plan:   Expected Discharge Date:   (unknown)               Expected Discharge Plan:     In-House Referral:     Discharge planning Services     Post Acute Care Choice:    Choice offered to:     DME Arranged:    DME Agency:     HH Arranged:    HH Agency:     Status of Service:     If discussed at [employer] of Stay Meetings, dates discussed:    Additional Comments:  Rolm Baptise, RN 05/01/2016, 12:01 PM

## 2016-05-01 NOTE — Clinical Social Work Note (Addendum)
CSW met with patient and presented bed offers. Patient has accepted bed offer at [facility]. CSW called and left voicemail for admissions coordinator.  Patient wants to assure that her prn Ambien is on her discharge summary in case she cannot sleep at night. Will let MD know.  Dayton Scrape, CSW [phone]  1:42 pm Received call back from admissions coordinator. She said to let her know when patient discharging.  Dayton Scrape, [place]  3:08 pm CSW notified patient that with her walking 80 feet and no oxygen, insurance is not likely to cover a PTAR transport. Patient will get a support to transport her.  Dayton Scrape, [place]

## 2016-05-01 NOTE — Progress Notes (Signed)
Pt discharging at this time to [facility] with friends taking all personal belongings. IV discontinued, dry dressing applied. Report called in to nurse [place] at the facility.

## 2016-05-01 NOTE — Clinical Social Work Placement (Signed)
   CLINICAL SOCIAL WORK PLACEMENT  NOTE  Date:  05/01/2016  Patient Details  Name: Patricia Beck MRN: [id number] Date of Birth: 03-Oct-1940  Clinical Social Work is seeking post-discharge placement for this patient at the [place] level of care (*CSW will initial, date and re-position this form in  chart as items are completed):  Yes   Patient/family provided with [place] Work Department's list of facilities offering this level of care within the geographic area requested by the patient (or if unable, by the patient's family).  Yes   Patient/family informed of their freedom to choose among providers that offer the needed level of care, that participate in Medicare, Medicaid or managed care program needed by the patient, have an available bed and are willing to accept the patient.  Yes   Patient/family informed of [facility]'s ownership interest in [facility] and [facility], as well as of the fact that they are under no obligation to receive care at these facilities.  PASRR submitted to EDS on 04/29/16     PASRR number received on 04/29/16     Existing PASRR number confirmed on       FL2 transmitted to all facilities in geographic area requested by pt/family on 04/29/16     FL2 transmitted to all facilities within larger geographic area on       Patient informed that his/her managed care company has contracts with or will negotiate with certain facilities, including the following:        Yes   Patient/family informed of bed offers received.  Patient chooses bed at [facility]     Physician recommends and patient chooses bed at      Patient to be transferred to [facility] on 05/01/16.  Patient to be transferred to facility by Support to pick her up and transport by car     Patient family notified on 05/01/16 of transfer.  Name of family member notified:        PHYSICIAN Please prepare  prescriptions     Additional Comment:    _______________________________________________ Candie Chroman, LCSW 05/01/2016, 3:46 PM

## 2016-05-01 NOTE — Clinical Social Work Note (Signed)
CSW facilitated patient discharge including contacting facility to confirm patient discharge plans. Clinical information faxed to facility and family agreeable with plan. Patient states that family will transport her to [facility] because insurance will likely not cover her PTAR ride. RN to call report prior to discharge [phone]).  CSW will sign off for now as social work intervention is no longer needed. Please consult [place] again if new needs arise.  Dayton Scrape, [place]

## 2016-05-02 ENCOUNTER — Non-Acute Institutional Stay (SKILLED_NURSING_FACILITY): Payer: Medicare Other | Admitting: Internal Medicine

## 2016-05-02 ENCOUNTER — Encounter: Payer: Self-pay | Admitting: Internal Medicine

## 2016-05-02 DIAGNOSIS — E559 Vitamin D deficiency, unspecified: Secondary | ICD-10-CM

## 2016-05-02 DIAGNOSIS — F411 Generalized anxiety disorder: Secondary | ICD-10-CM | POA: Diagnosis not present

## 2016-05-02 DIAGNOSIS — M549 Dorsalgia, unspecified: Secondary | ICD-10-CM

## 2016-05-02 DIAGNOSIS — E876 Hypokalemia: Secondary | ICD-10-CM

## 2016-05-02 DIAGNOSIS — I610 Nontraumatic intracerebral hemorrhage in hemisphere, subcortical: Secondary | ICD-10-CM | POA: Diagnosis not present

## 2016-05-02 DIAGNOSIS — I161 Hypertensive emergency: Secondary | ICD-10-CM

## 2016-05-02 DIAGNOSIS — F5101 Primary insomnia: Secondary | ICD-10-CM | POA: Diagnosis not present

## 2016-05-02 DIAGNOSIS — G8929 Other chronic pain: Secondary | ICD-10-CM

## 2016-05-02 DIAGNOSIS — E871 Hypo-osmolality and hyponatremia: Secondary | ICD-10-CM

## 2016-05-02 NOTE — Progress Notes (Signed)
: Provider:  Noah Delaine. Sheppard Coil, MD Location:  [place] Room Number: [id number] Place of Service:  SNF (31)  PCP: Pcp Not In System Patient Care Team: Pcp Not In System as PCP - General  Extended Emergency Contact Information Primary Emergency Contact: Kamphausen,Kate Address: [place], [place] Phone: [phone] Work Phone: [phone] Relation: Daughter Secondary Emergency Contact: Townsend,Stephen  [place] Phone: [phone] Relation: Other     Allergies: Aciphex [rabeprazole sodium]; Aleve [naproxen sodium]; Atarax [hydroxyzine]; Belsomra [suvorexant]; Buspirone; Codeine; Erythromycin; Keflex [cephalexin]; Lidocaine; Prednisone; Tramadol; and Zoloft [sertraline hcl]  Chief Complaint  Patient presents with  . New Admit To SNF    Admit to Facility    HPI: Patient is 76 y.o. female who was admitted to [facility] from 1/10-15 with increasing back pain, anxiety, andhyponatremiawho developed right sided weaknesswhile ambulating to the bedside commode 04/26/2016 while in the stepdown unit for elevated BP.Marland Kitchen CT showed left basal ganglia/internal capsule hemorrhage. During hospital all parameters improved and emphasis on risk factor control. Pt is admitted to SNF for oT/PT. While at SNF pt will be followed for hypothyroidism, tx with replacement, Vit d, tx with replacement and insomnia, tx with ambien.  Past Medical History:  Diagnosis Date  . Cataract   . Fibroma 2006  . Hypertension   . Thyroid disease     Past Surgical History:  Procedure Laterality Date  . EYE SURGERY    . fibroma      Allergies as of 05/02/2016      Reactions   Aciphex [rabeprazole Sodium]    Aleve [naproxen Sodium]    Atarax [hydroxyzine]    Belsomra [suvorexant] Other (See Comments)   Nightmares   Buspirone    Codeine    Erythromycin    Keflex [cephalexin]    Lidocaine    Prednisone    Insomnia, nausea, hot flush   Tramadol    Zoloft [sertraline Hcl]       Medication List       Accurate as of 05/02/16  8:50 AM. Always use your most recent med list.          acetaminophen 500 MG tablet Commonly known as:  TYLENOL Take 1,000 mg by mouth every 8 (eight) hours as needed for moderate pain (pain).   amLODipine 10 MG tablet Commonly known as:  NORVASC Take 1 tablet (10 mg total) by mouth daily.   bisoprolol 10 MG tablet Commonly known as:  ZEBETA Take 1 tablet (10 mg total) by mouth daily.   calcium citrate 950 MG tablet Commonly known as:  CALCITRATE - dosed in mg elemental calcium Take 200 mg of elemental calcium by mouth daily.   cholecalciferol 1000 units tablet Commonly known as:  VITAMIN D Take 5,000 Units by mouth daily.   citalopram 10 MG tablet Commonly known as:  CELEXA Take 1 tablet (10 mg total) by mouth daily.   polyethylene glycol packet Commonly known as:  MIRALAX / GLYCOLAX Take 17 g by mouth daily.   potassium chloride 20 MEQ packet Commonly known as:  KLOR-CON Take 20 mEq by mouth 2 (two) times daily.   senna-docusate 8.6-50 MG tablet Commonly known as:  Senokot-S Take 2 tablets by mouth 2 (two) times daily.   thyroid 90 MG tablet Commonly known as:  ARMOUR Take 1 tablet (90 mg total) by mouth daily before breakfast.  traMADol 50 MG tablet Commonly known as:  ULTRAM Take 1 tablet (50 mg total) by mouth every 12 (twelve) hours as needed for moderate pain.   vitamin C 1000 MG tablet Take 1,000 mg by mouth daily.   vitamin E 100 UNIT capsule Take by mouth daily.   zolpidem 5 MG tablet Commonly known as:  AMBIEN Take 1 tablet (5 mg total) by mouth at bedtime.       No orders of the defined types were placed in this encounter.    There is no immunization history on file for this patient.  Social History  Substance Use Topics  . Smoking status: Never Smoker  . Smokeless tobacco: Never Used  . Alcohol use No     Family history is   Family History  Problem Relation Age of Onset  . Diabetes Father       Review of Systems  DATA OBTAINED: from patient GENERAL:  no fevers, fatigue, appetite changes SKIN: No itching, or rash EYES: No eye pain, redness, discharge EARS: No earache, tinnitus, change in hearing NOSE: No congestion, drainage or bleeding  MOUTH/THROAT: No mouth or tooth pain, No sore throat RESPIRATORY: No cough, wheezing, SOB CARDIAC: No chest pain, palpitations, lower extremity edema  GI: No abdominal pain, No N/V/D or constipation, No heartburn or reflux  GU: No dysuria, frequency or urgency, or incontinence  MUSCULOSKELETAL:c/o LBP from her scoliosis NEUROLOGIC: No headache, dizziness or focal weakness PSYCHIATRIC: didn't sleep last night  Vitals:   05/02/16 0836  BP: 138/76  Pulse: 64  Resp: 18  Temp: 98.7 F (37.1 C)    SpO2 Readings from Last 1 Encounters:  05/01/16 97%   Body mass index is 21.73 kg/m.     Physical Exam  GENERAL APPEARANCE: Alert, conversant, Lying on pillows on her stomach rolling around "in pain".  SKIN: No diaphoresis rash HEAD: Normocephalic, atraumatic  EYES: Conjunctiva/lids clear. Pupils round, reactive. EOMs intact.  EARS: External exam WNL, canals clear. Hearing grossly normal.  NOSE: No deformity or discharge.  MOUTH/THROAT: Lips w/o lesions  RESPIRATORY: Breathing is even, unlabored. Lung sounds are clear   CARDIOVASCULAR: Heart RRR no murmurs, rubs or gallops. No peripheral edema.   GASTROINTESTINAL: Abdomen is soft, non-tender, not distended w/ normal bowel sounds. GENITOURINARY: Bladder non tender, not distended  MUSCULOSKELETAL: No abnormal joints or musculature NEUROLOGIC:  Cranial nerves 2-12 grossly intact. Moves all extremities  PSYCHIATRIC: Mood and affect anxious and odd., no behavioral issues  Patient Active Problem List   Diagnosis Date Noted  . Hypertensive emergency 05/01/2016  . Anxiety state 05/01/2016   . Chest pain 05/01/2016  . 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 Hypothyroidism 07/30/2014  . Hyponatremia 07/30/2014  . Hypokalemia 07/30/2014  . Hypochloremia 07/30/2014  . Nausea & vomiting 07/30/2014  . UTI (lower urinary tract infection) 07/30/2014  . Chronic back pain 07/30/2014  . Insomnia 07/30/2014  . Hypertension   . Sciatic neuritis 06/30/2014      Labs reviewed: Basic Metabolic Panel:    Component Value Date/Time   NA 129 (L) 05/01/2016 0443   NA 129 (A) 05/01/2016   K 3.5 05/01/2016 0443   CL 94 (L) 05/01/2016 0443   CO2 25 05/01/2016 0443   GLUCOSE 114 (H) 05/01/2016 0443   BUN 18 05/01/2016 0443   BUN 18 05/01/2016   CREATININE 0.64 05/01/2016 0443   CALCIUM 9.4 05/01/2016 0443   PROT 7.0 07/30/2014 1646   ALBUMIN 3.5  07/30/2014 1646   AST 36 07/30/2014 1646   ALT 21 07/30/2014 1646   ALKPHOS 55 07/30/2014 1646   BILITOT 1.0 07/30/2014 1646   GFRNONAA >60 05/01/2016 0443   GFRAA >60 05/01/2016 0443     Recent Labs  04/26/16 2052  04/28/16 0336 04/29/16 0254 04/30/16 0449 05/01/16 05/01/16 0443  NA  --   < >  --  127* 126* 129* 129*  K  --   < >  --  3.6 3.2*  --  3.5  CL  --   < >  --  96* 92*  --  94*  CO2  --   < >  --  21* 23  --  25  GLUCOSE  --   < >  --  106* 121*  --  114*  BUN  --   < >  --  23* 17 18 18   CREATININE  --   < >  --  0.86 0.75 0.6 0.64  CALCIUM  --   < >  --  9.2 9.4  --  9.4  MG 2.0  --  1.9  --   --   --   --   PHOS 2.4*  --  2.7  --   --   --   --   < > = values in this interval not displayed. Liver Function Tests: No results for input(s): AST, ALT, ALKPHOS, BILITOT, PROT, ALBUMIN in the last 8760 hours. No results for input(s): LIPASE, AMYLASE in the last 8760 hours. No results for input(s): AMMONIA in the last 8760 hours. CBC:  Recent Labs  04/29/16 0254 04/30/16 0449 05/01/16 05/01/16 0443  WBC 10.9* 10.4 10.7 10.7*  HGB 12.9 14.3  --  13.5  HCT 37.3 39.7  --   38.2  MCV 88.2 88.2  --  88.6  PLT 327 306  --  292   Lipid  Recent Labs  04/28/16 0336  CHOL 205*  HDL 60  LDLCALC 131*  TRIG 72    Cardiac Enzymes:  Recent Labs  04/26/16 2052  TROPONINI <0.03   BNP: No results for input(s): BNP in the last 8760 hours. No results found for: [facility] Lab Results  Component Value Date   HGBA1C 5.7 (H) 04/28/2016   Lab Results  Component Value Date   TSH 5.293 (H) 04/26/2016   No results found for: VITAMINB12 No results found for: FOLATE No results found for: IRON, TIBC, FERRITIN  Imaging and Procedures obtained prior to SNF admission: Dg Chest 2 View  Result Date: 04/26/2016 CLINICAL DATA:  Dull chest pain for 2 days. EXAM: CHEST  2 VIEW COMPARISON:  10/29/2014. FINDINGS: The heart size and mediastinal contours are within normal limits. Both lungs are clear. Severe thoracolumbar scoliosis convex RIGHT, unchanged. IMPRESSION: No active cardiopulmonary disease.  Stable exam. Electronically Signed   By: Staci Righter M.D.   On: 04/26/2016 12:06   Ct Head Wo Contrast  Result Date: 04/26/2016 CLINICAL DATA:  Clinical findings consistent with stroke. Changes in speech and right arm over the last few hours. EXAM: CT HEAD WITHOUT CONTRAST TECHNIQUE: Contiguous axial images were obtained from the base of the skull through the vertex without intravenous contrast. COMPARISON:  MRI brain 03/04/2010.  CT head 12/09/2008 FINDINGS: Brain: There is an acute intraparenchymal hematoma in the region of the left internal capsule measuring 2.2 x 2.3 cm in diameter. Mild surrounding edema. No mass effect or midline shift. Diffuse cerebral atrophy. Mild ventricular dilatation consistent  with central atrophy. Low-attenuation changes in the deep white matter consistent with small vessel ischemia. No abnormal extra-axial fluid collections. Gray-white matter junctions are distinct. Basal cisterns are not effaced. Vascular: Diffuse vascular calcifications. The left  vertebral artery is densely calcified and ectatic. Appearance is similar to prior studies. Skull: Normal. Negative for fracture or focal lesion. Sinuses/Orbits: No acute finding. Other: None. IMPRESSION: Acute intraparenchymal hemorrhage in the left internal capsule region with mild surrounding edema. Underlying chronic atrophy and small vessel ischemia. These results were called by telephone at the time of interpretation on 04/26/2016 at 10:03 pm to Dr. Cristal Ford , who verbally acknowledged these results. Electronically Signed   By: Lucienne Capers M.D.   On: 04/26/2016 22:11   Mr Jodene Nam Head Wo Contrast  Result Date: 04/27/2016 CLINICAL DATA:  Continued surveillance intraparenchymal hemorrhage. RIGHT-sided weakness. EXAM: MRI HEAD WITHOUT CONTRAST MRA HEAD WITHOUT CONTRAST TECHNIQUE: Multiplanar, multiecho pulse sequences of the brain and surrounding structures were obtained without intravenous contrast. Angiographic images of the head were obtained using MRA technique without contrast. COMPARISON:  CT head 04/26/2016. FINDINGS: MRI HEAD FINDINGS Brain: Acute intraparenchymal basal ganglia hemorrhage, epicenter posterior lentiform nucleus and internal capsule on the LEFT, markedly T2 hypointense consistent with deoxyhemoglobin, correlating with CT appearance, having cross-sectional measurements of 24 x 29 x 30 mm (R-L x A-P x C-C), corresponding to a volume of 10.5 mL. Mild surrounding edema. No midline shift. Normal for age cerebral volume. Extensive focal and confluent T2 and FLAIR hyperintensities throughout the white matter, likely chronic microvascular ischemic change. Gradient sequence demonstrates numerous areas of susceptibility (greater than 10) throughout the deep white matter, deep nuclei, brainstem, vermis, and cerebellar hemispheres consistent with microbleeds related to chronic hypertensive cerebrovascular disease. Vascular: Heavily calcified and dolichoectatic distal LEFT vertebral  demonstrates susceptibility due to mineralization representing heavy calcification along its V4 segment. Flow voids are maintained throughout. Skull and upper cervical spine: Unremarkable. Sinuses/Orbits: No layering sinus or mastoid fluid. Other: None.  Compared with yesterday's CT, similar appearance. MRA HEAD FINDINGS Dolichoectatic but widely patent internal carotid arteries. Slight BILATERAL cavernous irregularity and outpouchings representing non stenotic atheromatous change. No proximal anterior or middle cerebral artery stenosis or dissection. Dolichoectatic basilar artery swings to the RIGHT. Dolichoectatic distal LEFT vertebral artery mildly dilated without evidence for dissection. RIGHT vertebral artery is diminutive, contributes only to PICA. Slight irregularity distal MCA and PCA branches consistent with intracranial atherosclerotic change. Fetal origin LEFT PCA. No saccular aneurysm. No vascular malformation. IMPRESSION: Acute intraparenchymal hemorrhage as identified on CT, posterior LEFT lentiform nucleus and internal capsule, measuring 24 x 29 x 30 mm corresponding to a volume of 10.5 mL. Mild surrounding AVM without midline shift. Widespread chronic microbleeds throughout the supratentorial and infratentorial compartment, along with extensive focal and confluent ischemic demyelination consistent with longstanding chronic hypertensive cerebrovascular disease. No intracranial vascular malformation, proximal stenosis, or dissection. Electronically Signed   By: Staci Righter M.D.   On: 04/27/2016 16:23   Mr Brain Wo Contrast  Result Date: 04/27/2016 CLINICAL DATA:  Continued surveillance intraparenchymal hemorrhage. RIGHT-sided weakness. EXAM: MRI HEAD WITHOUT CONTRAST MRA HEAD WITHOUT CONTRAST TECHNIQUE: Multiplanar, multiecho pulse sequences of the brain and surrounding structures were obtained without intravenous contrast. Angiographic images of the head were obtained using MRA technique without  contrast. COMPARISON:  CT head 04/26/2016. FINDINGS: MRI HEAD FINDINGS Brain: Acute intraparenchymal basal ganglia hemorrhage, epicenter posterior lentiform nucleus and internal capsule on the LEFT, markedly T2 hypointense consistent with deoxyhemoglobin, correlating with CT appearance, having  cross-sectional measurements of 24 x 29 x 30 mm (R-L x A-P x C-C), corresponding to a volume of 10.5 mL. Mild surrounding edema. No midline shift. Normal for age cerebral volume. Extensive focal and confluent T2 and FLAIR hyperintensities throughout the white matter, likely chronic microvascular ischemic change. Gradient sequence demonstrates numerous areas of susceptibility (greater than 10) throughout the deep white matter, deep nuclei, brainstem, vermis, and cerebellar hemispheres consistent with microbleeds related to chronic hypertensive cerebrovascular disease. Vascular: Heavily calcified and dolichoectatic distal LEFT vertebral demonstrates susceptibility due to mineralization representing heavy calcification along its V4 segment. Flow voids are maintained throughout. Skull and upper cervical spine: Unremarkable. Sinuses/Orbits: No layering sinus or mastoid fluid. Other: None.  Compared with yesterday's CT, similar appearance. MRA HEAD FINDINGS Dolichoectatic but widely patent internal carotid arteries. Slight BILATERAL cavernous irregularity and outpouchings representing non stenotic atheromatous change. No proximal anterior or middle cerebral artery stenosis or dissection. Dolichoectatic basilar artery swings to the RIGHT. Dolichoectatic distal LEFT vertebral artery mildly dilated without evidence for dissection. RIGHT vertebral artery is diminutive, contributes only to PICA. Slight irregularity distal MCA and PCA branches consistent with intracranial atherosclerotic change. Fetal origin LEFT PCA. No saccular aneurysm. No vascular malformation. IMPRESSION: Acute intraparenchymal hemorrhage as identified on CT,  posterior LEFT lentiform nucleus and internal capsule, measuring 24 x 29 x 30 mm corresponding to a volume of 10.5 mL. Mild surrounding AVM without midline shift. Widespread chronic microbleeds throughout the supratentorial and infratentorial compartment, along with extensive focal and confluent ischemic demyelination consistent with longstanding chronic hypertensive cerebrovascular disease. No intracranial vascular malformation, proximal stenosis, or dissection. Electronically Signed   By: Staci Righter M.D.   On: 04/27/2016 16:23     Not all labs, radiology exams or other studies done during hospitalization come through on my EPIC note; however they are reviewed by me.    Assessment and Plan  iCH - L BASAL GANGLIA/INTERNAL CAPSULE BLEED 2/2 HTN - MRA-No intracranial vascular malformation, proximal stenosis, or dissection; CarotidDopplerunremarkable; 2D Echo- EF 60 - 65%. No cardiac source of emboli identified.;IT:[id number]; HgbA1c - 5.7;No antithromboticprior to admission, no antithrombotics due to [place] Ongoing aggressive stroke risk factor management SNF - admit for OT/PT  HTN EMERGENCY-Blood pressure as high as 204/101 in setting of neurologic symptoms.;Treated with Cardene drip in hospital; Blood pressure improved BP goal <160  In hospital SNF - will cont norvasc 10 mg, bisoprolol 10 mg,; will be monitoring BP daily  CHRONIC LBP - from scoliosis SNF - will need to stay with tramadol for pain, allergy to codeine and hasn't taken norco; will add prn robaxin;pt seem more anxious than in pain  ANXIETY - sensitive to a lot od meds; zoloft and xanax are no's; celexa was started with some success SNF - cont celexa 10 mg daily  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 45 MIN;> 50% of time with patient was spent reviewing records, labs, tests and studies, counseling and developing plan of care  Hennie Duos, MD

## 2016-05-05 LAB — BASIC METABOLIC PANEL
BUN: 19 mg/dL (ref 4–21)
CREATININE: 0.6 mg/dL (ref 0.5–1.1)
GLUCOSE: 180 mg/dL
POTASSIUM: 3.9 mmol/L (ref 3.4–5.3)
SODIUM: 128 mmol/L — AB (ref 137–147)

## 2016-05-05 LAB — CBC AND DIFFERENTIAL
HCT: 40 % (ref 36–46)
HEMOGLOBIN: 13.9 g/dL (ref 12.0–16.0)
Platelets: 352 10*3/uL (ref 150–399)
WBC: 9.7 10*3/mL

## 2016-05-10 ENCOUNTER — Other Ambulatory Visit: Payer: Self-pay | Admitting: *Deleted

## 2016-05-10 ENCOUNTER — Encounter: Payer: Self-pay | Admitting: Internal Medicine

## 2016-05-10 ENCOUNTER — Non-Acute Institutional Stay (SKILLED_NURSING_FACILITY): Payer: Medicare Other | Admitting: Internal Medicine

## 2016-05-10 DIAGNOSIS — F419 Anxiety disorder, unspecified: Secondary | ICD-10-CM | POA: Diagnosis not present

## 2016-05-10 NOTE — Patient Outreach (Signed)
[facility]) Care Management  05/10/2016  Patricia Beck [birth year] [phone]   Briefly met with patient at bedside to review patient anticipated discharge planning needs. Patient reports she has had a stroke, she is hoping she will get enough independence back through rehab stay to go home alone. States she was independent before stroke.  She states she will have to make other plans if she will need 24 hour assistance or oversight.   RNCM briefly discussed [facility] program, will plan to follow patient for discharge needs.   Royetta Crochet. Laymond Purser, RN, BSN, [place] [phone]) Business Cell  [phone]) Toll Free Office

## 2016-05-10 NOTE — Progress Notes (Signed)
Location:  [place] Room Number: 110P Place of Service:  SNF (31)  Noah Delaine. Sheppard Coil, MD  Patient Care Team: Pcp Not In System as PCP - General  Extended Emergency Contact Information Primary Emergency Contact: Kamphausen,Kate Address: [place], [place] Phone: [phone] Work Phone: [phone] Relation: Daughter Secondary Emergency Contact: Townsend,Stephen  [place] Phone: [phone] Relation: Other    Allergies: Aciphex [rabeprazole sodium]; Aleve [naproxen sodium]; Atarax [hydroxyzine]; Belsomra [suvorexant]; Buspirone; Codeine; Erythromycin; Keflex [cephalexin]; Lidocaine; Prednisone; Tramadol; and Zoloft [sertraline hcl]  Chief Complaint  Patient presents with  . Acute Visit    Acute    HPI: Patient is 76 y.o. female who is being seen acutely. Nursing reports pt has been sleepy during the daytime. Pt has needed klonopin to face rehab and has been getting it in the am and qHS.  Past Medical History:  Diagnosis Date  . Cataract   . Fibroma 2006  . Hypertension   . Thyroid disease     Past Surgical History:  Procedure Laterality Date  . EYE SURGERY    . fibroma      Allergies as of 05/10/2016      Reactions   Aciphex [rabeprazole Sodium]    Aleve [naproxen Sodium]    Atarax [hydroxyzine]    Belsomra [suvorexant] Other (See Comments)   Nightmares   Buspirone    Codeine    Erythromycin    Keflex [cephalexin]    Lidocaine    Prednisone    Insomnia, nausea, hot flush   Tramadol    Zoloft [sertraline Hcl]       Medication List       Accurate as of 05/10/16  3:03 PM. Always use your most recent med list.          acetaminophen 500 MG tablet Commonly known as:  TYLENOL Take 1,000 mg by mouth every 8 (eight) hours as needed for moderate pain (pain).   amLODipine 10 MG tablet Commonly known as:  NORVASC Take 1 tablet (10 mg total) by mouth  daily.   bisoprolol 10 MG tablet Commonly known as:  ZEBETA Take 1 tablet (10 mg total) by mouth daily.   calcium citrate 950 MG tablet Commonly known as:  CALCITRATE - dosed in mg elemental calcium Take 200 mg of elemental calcium by mouth daily.   cholecalciferol 1000 units tablet Commonly known as:  VITAMIN D Take 5,000 Units by mouth daily.   citalopram 10 MG tablet Commonly known as:  CELEXA Take 1 tablet (10 mg total) by mouth daily.   clonazePAM 0.5 MG tablet Commonly known as:  KLONOPIN Take 0.5 mg by mouth 2 (two) times daily.   methocarbamol 500 MG tablet Commonly known as:  ROBAXIN Take 500 mg by mouth 3 (three) times daily.   polyethylene glycol packet Commonly known as:  MIRALAX / GLYCOLAX Take 17 g by mouth daily.   potassium chloride 20 MEQ packet Commonly known as:  KLOR-CON Take 20 mEq by mouth 2 (two) times daily.   senna-docusate 8.6-50 MG tablet Commonly known as:  Senokot-S Take 2 tablets by mouth 2 (two) times daily.   thyroid 90 MG tablet Commonly known as:  ARMOUR Take 1 tablet (90 mg total) by mouth daily before breakfast.   traMADol 50 MG tablet Commonly known as:  ULTRAM Take by mouth every 12 (twelve) hours. scheduled   vitamin  C 1000 MG tablet Take 1,000 mg by mouth daily.   vitamin E 100 UNIT capsule Take by mouth daily.   zolpidem 5 MG tablet Commonly known as:  AMBIEN Take 1 tablet (5 mg total) by mouth at bedtime.       No orders of the defined types were placed in this encounter.    There is no immunization history on file for this patient.  Social History  Substance Use Topics  . Smoking status: Never Smoker  . Smokeless tobacco: Never Used  . Alcohol use No    Review of Systems  DATA OBTAINED: from patient, nurse GENERAL:  no fevers, fatigue, appetite changes SKIN: No itching, rash HEENT: No complaint RESPIRATORY: No cough, wheezing, SOB CARDIAC: No chest pain, palpitations, lower extremity edema  GI:  No abdominal pain, No N/V/D or constipation, No heartburn or reflux  GU: No dysuria, frequency or urgency, or incontinence  MUSCULOSKELETAL: No unrelieved bone/joint pain NEUROLOGIC: No headache, dizziness  PSYCHIATRIC: No overt anxiety or sadness  Vitals:   05/10/16 1501  BP: (!) 104/59  Pulse: 76  Resp: 18  Temp: 97.6 F (36.4 C)   Body mass index is 21.58 kg/m. Physical Exam  GENERAL APPEARANCE: looks sleepy, conversant, No acute distress  SKIN: No diaphoresis rash HEENT: Unremarkable RESPIRATORY: Breathing is even, unlabored. Lung sounds are clear   CARDIOVASCULAR: Heart RRR no murmurs, rubs or gallops. No peripheral edema  GASTROINTESTINAL: Abdomen is soft, non-tender, not distended w/ normal bowel sounds.  GENITOURINARY: Bladder non tender, not distended  MUSCULOSKELETAL: No abnormal joints or musculature NEUROLOGIC: Cranial nerves 2-12 grossly intact. Moves all extremities; pt answers apprpriately, sheis just sleepy PSYCHIATRIC: Mood and affect appropriate to situation, no behavioral issues  Patient Active Problem List   Diagnosis Date Noted  . Vitamin D deficiency 05/02/2016  . Hypertensive emergency 05/01/2016  . Anxiety state 05/01/2016  . Chest pain 05/01/2016  . 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 Hypothyroidism 07/30/2014  . Hyponatremia 07/30/2014  . Hypokalemia 07/30/2014  . Hypochloremia 07/30/2014  . Nausea & vomiting 07/30/2014  . UTI (lower urinary tract infection) 07/30/2014  . Chronic back pain 07/30/2014  . Insomnia 07/30/2014  . Hypertension   . Sciatic neuritis 06/30/2014    CMP     Component Value Date/Time   NA 128 (A) 05/05/2016   K 3.9 05/05/2016   CL 94 (L) 05/01/2016 0443   CO2 25 05/01/2016 0443   GLUCOSE 114 (H) 05/01/2016 0443   BUN 19 05/05/2016   CREATININE 0.6 05/05/2016   CREATININE 0.64 05/01/2016 0443   CALCIUM 9.4 05/01/2016 0443   PROT 7.0 07/30/2014 1646   ALBUMIN 3.5  07/30/2014 1646   AST 36 07/30/2014 1646   ALT 21 07/30/2014 1646   ALKPHOS 55 07/30/2014 1646   BILITOT 1.0 07/30/2014 1646   GFRNONAA >60 05/01/2016 0443   GFRAA >60 05/01/2016 0443    Recent Labs  04/26/16 2052  04/28/16 0336 04/29/16 0254 04/30/16 0449 05/01/16 05/01/16 0443 05/05/16  NA  --   < >  --  127* 126* 129* 129* 128*  K  --   < >  --  3.6 3.2*  --  3.5 3.9  CL  --   < >  --  96* 92*  --  94*  --   CO2  --   < >  --  21* 23  --  25  --   GLUCOSE  --   < >  --  106* 121*  --  114*  --   BUN  --   < >  --  23* 17 18 18 19   CREATININE  --   < >  --  0.86 0.75 0.6 0.64 0.6  CALCIUM  --   < >  --  9.2 9.4  --  9.4  --   MG 2.0  --  1.9  --   --   --   --   --   PHOS 2.4*  --  2.7  --   --   --   --   --   < > = values in this interval not displayed. No results for input(s): AST, ALT, ALKPHOS, BILITOT, PROT, ALBUMIN in the last 8760 hours.  Recent Labs  04/29/16 0254 04/30/16 0449 05/01/16 05/01/16 0443 05/05/16  WBC 10.9* 10.4 10.7 10.7* 9.7  HGB 12.9 14.3  --  13.5 13.9  HCT 37.3 39.7  --  38.2 40  MCV 88.2 88.2  --  88.6  --   PLT 327 306  --  292 352    Recent Labs  04/28/16 0336  CHOL 205*  LDLCALC 131*  TRIG 72   No results found for: [facility] Lab Results  Component Value Date   TSH 5.293 (H) 04/26/2016   Lab Results  Component Value Date   HGBA1C 5.7 (H) 04/28/2016   Lab Results  Component Value Date   CHOL 205 (H) 04/28/2016   HDL 60 04/28/2016   LDLCALC 131 (H) 04/28/2016   TRIG 72 04/28/2016   CHOLHDL 3.4 04/28/2016    Significant Diagnostic Results in last 30 days:  Dg Chest 2 View  Result Date: 04/26/2016 CLINICAL DATA:  Dull chest pain for 2 days. EXAM: CHEST  2 VIEW COMPARISON:  10/29/2014. FINDINGS: The heart size and mediastinal contours are within normal limits. Both lungs are clear. Severe thoracolumbar scoliosis convex RIGHT, unchanged. IMPRESSION: No active cardiopulmonary disease.  Stable exam. Electronically Signed    By: Staci Righter M.D.   On: 04/26/2016 12:06   Ct Head Wo Contrast  Result Date: 04/26/2016 CLINICAL DATA:  Clinical findings consistent with stroke. Changes in speech and right arm over the last few hours. EXAM: CT HEAD WITHOUT CONTRAST TECHNIQUE: Contiguous axial images were obtained from the base of the skull through the vertex without intravenous contrast. COMPARISON:  MRI brain 03/04/2010.  CT head 12/09/2008 FINDINGS: Brain: There is an acute intraparenchymal hematoma in the region of the left internal capsule measuring 2.2 x 2.3 cm in diameter. Mild surrounding edema. No mass effect or midline shift. Diffuse cerebral atrophy. Mild ventricular dilatation consistent with central atrophy. Low-attenuation changes in the deep white matter consistent with small vessel ischemia. No abnormal extra-axial fluid collections. Gray-white matter junctions are distinct. Basal cisterns are not effaced. Vascular: Diffuse vascular calcifications. The left vertebral artery is densely calcified and ectatic. Appearance is similar to prior studies. Skull: Normal. Negative for fracture or focal lesion. Sinuses/Orbits: No acute finding. Other: None. IMPRESSION: Acute intraparenchymal hemorrhage in the left internal capsule region with mild surrounding edema. Underlying chronic atrophy and small vessel ischemia. These results were called by telephone at the time of interpretation on 04/26/2016 at 10:03 pm to Dr. Cristal Ford , who verbally acknowledged these results. Electronically Signed   By: Lucienne Capers M.D.   On: 04/26/2016 22:11   Mr Jodene Nam Head Wo Contrast  Result Date: 04/27/2016 CLINICAL DATA:  Continued surveillance intraparenchymal hemorrhage. RIGHT-sided weakness. EXAM: MRI HEAD WITHOUT  CONTRAST MRA HEAD WITHOUT CONTRAST TECHNIQUE: Multiplanar, multiecho pulse sequences of the brain and surrounding structures were obtained without intravenous contrast. Angiographic images of the head were obtained using MRA  technique without contrast. COMPARISON:  CT head 04/26/2016. FINDINGS: MRI HEAD FINDINGS Brain: Acute intraparenchymal basal ganglia hemorrhage, epicenter posterior lentiform nucleus and internal capsule on the LEFT, markedly T2 hypointense consistent with deoxyhemoglobin, correlating with CT appearance, having cross-sectional measurements of 24 x 29 x 30 mm (R-L x A-P x C-C), corresponding to a volume of 10.5 mL. Mild surrounding edema. No midline shift. Normal for age cerebral volume. Extensive focal and confluent T2 and FLAIR hyperintensities throughout the white matter, likely chronic microvascular ischemic change. Gradient sequence demonstrates numerous areas of susceptibility (greater than 10) throughout the deep white matter, deep nuclei, brainstem, vermis, and cerebellar hemispheres consistent with microbleeds related to chronic hypertensive cerebrovascular disease. Vascular: Heavily calcified and dolichoectatic distal LEFT vertebral demonstrates susceptibility due to mineralization representing heavy calcification along its V4 segment. Flow voids are maintained throughout. Skull and upper cervical spine: Unremarkable. Sinuses/Orbits: No layering sinus or mastoid fluid. Other: None.  Compared with yesterday's CT, similar appearance. MRA HEAD FINDINGS Dolichoectatic but widely patent internal carotid arteries. Slight BILATERAL cavernous irregularity and outpouchings representing non stenotic atheromatous change. No proximal anterior or middle cerebral artery stenosis or dissection. Dolichoectatic basilar artery swings to the RIGHT. Dolichoectatic distal LEFT vertebral artery mildly dilated without evidence for dissection. RIGHT vertebral artery is diminutive, contributes only to PICA. Slight irregularity distal MCA and PCA branches consistent with intracranial atherosclerotic change. Fetal origin LEFT PCA. No saccular aneurysm. No vascular malformation. IMPRESSION: Acute intraparenchymal hemorrhage as  identified on CT, posterior LEFT lentiform nucleus and internal capsule, measuring 24 x 29 x 30 mm corresponding to a volume of 10.5 mL. Mild surrounding AVM without midline shift. Widespread chronic microbleeds throughout the supratentorial and infratentorial compartment, along with extensive focal and confluent ischemic demyelination consistent with longstanding chronic hypertensive cerebrovascular disease. No intracranial vascular malformation, proximal stenosis, or dissection. Electronically Signed   By: Staci Righter M.D.   On: 04/27/2016 16:23   Mr Brain Wo Contrast  Result Date: 04/27/2016 CLINICAL DATA:  Continued surveillance intraparenchymal hemorrhage. RIGHT-sided weakness. EXAM: MRI HEAD WITHOUT CONTRAST MRA HEAD WITHOUT CONTRAST TECHNIQUE: Multiplanar, multiecho pulse sequences of the brain and surrounding structures were obtained without intravenous contrast. Angiographic images of the head were obtained using MRA technique without contrast. COMPARISON:  CT head 04/26/2016. FINDINGS: MRI HEAD FINDINGS Brain: Acute intraparenchymal basal ganglia hemorrhage, epicenter posterior lentiform nucleus and internal capsule on the LEFT, markedly T2 hypointense consistent with deoxyhemoglobin, correlating with CT appearance, having cross-sectional measurements of 24 x 29 x 30 mm (R-L x A-P x C-C), corresponding to a volume of 10.5 mL. Mild surrounding edema. No midline shift. Normal for age cerebral volume. Extensive focal and confluent T2 and FLAIR hyperintensities throughout the white matter, likely chronic microvascular ischemic change. Gradient sequence demonstrates numerous areas of susceptibility (greater than 10) throughout the deep white matter, deep nuclei, brainstem, vermis, and cerebellar hemispheres consistent with microbleeds related to chronic hypertensive cerebrovascular disease. Vascular: Heavily calcified and dolichoectatic distal LEFT vertebral demonstrates susceptibility due to  mineralization representing heavy calcification along its V4 segment. Flow voids are maintained throughout. Skull and upper cervical spine: Unremarkable. Sinuses/Orbits: No layering sinus or mastoid fluid. Other: None.  Compared with yesterday's CT, similar appearance. MRA HEAD FINDINGS Dolichoectatic but widely patent internal carotid arteries. Slight BILATERAL cavernous irregularity and outpouchings representing non stenotic atheromatous change.  No proximal anterior or middle cerebral artery stenosis or dissection. Dolichoectatic basilar artery swings to the RIGHT. Dolichoectatic distal LEFT vertebral artery mildly dilated without evidence for dissection. RIGHT vertebral artery is diminutive, contributes only to PICA. Slight irregularity distal MCA and PCA branches consistent with intracranial atherosclerotic change. Fetal origin LEFT PCA. No saccular aneurysm. No vascular malformation. IMPRESSION: Acute intraparenchymal hemorrhage as identified on CT, posterior LEFT lentiform nucleus and internal capsule, measuring 24 x 29 x 30 mm corresponding to a volume of 10.5 mL. Mild surrounding AVM without midline shift. Widespread chronic microbleeds throughout the supratentorial and infratentorial compartment, along with extensive focal and confluent ischemic demyelination consistent with longstanding chronic hypertensive cerebrovascular disease. No intracranial vascular malformation, proximal stenosis, or dissection. Electronically Signed   By: Staci Righter M.D.   On: 04/27/2016 16:23    Assessment and Plan  ANXIETY/ SLEEPINESS -will d/c klonopin in am and cont Klonopin 0.5 mg qHS; will moniotr    Anne D. Sheppard Coil, MD

## 2016-05-17 ENCOUNTER — Non-Acute Institutional Stay (SKILLED_NURSING_FACILITY): Payer: Medicare Other | Admitting: Internal Medicine

## 2016-05-17 DIAGNOSIS — Z20828 Contact with and (suspected) exposure to other viral communicable diseases: Secondary | ICD-10-CM

## 2016-05-18 ENCOUNTER — Encounter: Payer: Self-pay | Admitting: Internal Medicine

## 2016-05-18 ENCOUNTER — Non-Acute Institutional Stay (SKILLED_NURSING_FACILITY): Payer: Medicare Other | Admitting: Internal Medicine

## 2016-05-18 DIAGNOSIS — N390 Urinary tract infection, site not specified: Secondary | ICD-10-CM

## 2016-05-18 DIAGNOSIS — B952 Enterococcus as the cause of diseases classified elsewhere: Secondary | ICD-10-CM | POA: Diagnosis not present

## 2016-05-18 NOTE — Progress Notes (Signed)
Location:  [facility] Room Number: 110P Place of Service:  SNF (31)  Patricia Beck. Sheppard Coil, MD  Patient Care Team: Pcp Not In System as PCP - General  Extended Emergency Contact Information Primary Emergency Contact: Kamphausen,Kate Address: [place], [place] Phone: [phone] Work Phone: [phone] Relation: Daughter Secondary Emergency Contact: Townsend,Stephen  [place] Phone: [phone] Relation: Other    Allergies: Aciphex [rabeprazole sodium]; Aleve [naproxen sodium]; Atarax [hydroxyzine]; Belsomra [suvorexant]; Buspirone; Codeine; Erythromycin; Keflex [cephalexin]; Lidocaine; Prednisone; Tramadol; and Zoloft [sertraline hcl]  Chief Complaint  Patient presents with  . Acute Visit    Acute    HPI: Patient is 76 y.o. female who is being seen for acute UTI for urine sent for dysuria and frequency. No reported fever.Urine grew out > 100,000 enterococcus.  Past Medical History:  Diagnosis Date  . Cataract   . Fibroma 2006  . Hypertension   . Thyroid disease     Past Surgical History:  Procedure Laterality Date  . EYE SURGERY    . fibroma      Allergies as of 05/18/2016      Reactions   Aciphex [rabeprazole Sodium]    Aleve [naproxen Sodium]    Atarax [hydroxyzine]    Belsomra [suvorexant] Other (See Comments)   Nightmares   Buspirone    Codeine    Erythromycin    Keflex [cephalexin]    Lidocaine    Prednisone    Insomnia, nausea, hot flush   Tramadol    Zoloft [sertraline Hcl]       Medication List       Accurate as of 05/18/16  1:41 PM. Always use your most recent med list.          acetaminophen 500 MG tablet Commonly known as:  TYLENOL Take 1,000 mg by mouth every 8 (eight) hours as needed for moderate pain (pain).   amLODipine 10 MG tablet Commonly known as:  NORVASC Take 1 tablet (10 mg total) by mouth daily.   bisoprolol 10 MG  tablet Commonly known as:  ZEBETA Take 1 tablet (10 mg total) by mouth daily.   calcium citrate 950 MG tablet Commonly known as:  CALCITRATE - dosed in mg elemental calcium Take 200 mg of elemental calcium by mouth daily.   cholecalciferol 1000 units tablet Commonly known as:  VITAMIN D Take 5,000 Units by mouth daily.   citalopram 10 MG tablet Commonly known as:  CELEXA Take 1 tablet (10 mg total) by mouth daily.   clonazePAM 0.5 MG tablet Commonly known as:  KLONOPIN Take 0.5 mg by mouth 2 (two) times daily.   methocarbamol 500 MG tablet Commonly known as:  ROBAXIN Take 500 mg by mouth 3 (three) times daily.   polyethylene glycol packet Commonly known as:  MIRALAX / GLYCOLAX Take 17 g by mouth daily.   potassium chloride 20 MEQ packet Commonly known as:  KLOR-CON Take 20 mEq by mouth 2 (two) times daily.   senna-docusate 8.6-50 MG tablet Commonly known as:  Senokot-S Take 2 tablets by mouth 2 (two) times daily.   thyroid 90 MG tablet Commonly known as:  ARMOUR Take 1 tablet (90 mg total) by mouth daily before breakfast.   traMADol 50 MG tablet Commonly known as:  ULTRAM Take by mouth every 12 (twelve) hours. scheduled   vitamin C 1000 MG tablet Take 1,000 mg by mouth  daily.   vitamin E 100 UNIT capsule Take by mouth daily.   zolpidem 5 MG tablet Commonly known as:  AMBIEN Take 1 tablet (5 mg total) by mouth at bedtime.       No orders of the defined types were placed in this encounter.    There is no immunization history on file for this patient.  Social History  Substance Use Topics  . Smoking status: Never Smoker  . Smokeless tobacco: Never Used  . Alcohol use No    Review of Systems  DATA OBTAINED: from patient, nurse GENERAL:  no fevers, fatigue, appetite changes SKIN: No itching, rash HEENT: No complaint RESPIRATORY: No cough, wheezing, SOB CARDIAC: No chest pain, palpitations, lower extremity edema  GI: No abdominal pain, No N/V/D  or constipation, No heartburn or reflux  GU: + dysuria, frequency, urgency, NO  incontinence  MUSCULOSKELETAL: No unrelieved bone/joint pain NEUROLOGIC: No headache, dizziness  PSYCHIATRIC: No overt anxiety or sadness  Vitals:   05/18/16 1154  BP: 105/65  Pulse: 76  Resp: 18  Temp: (!) 96.8 F (36 C)   Body mass index is 19.42 kg/m. Physical Exam  GENERAL APPEARANCE: Alert, conversant, No acute distress  SKIN: No diaphoresis rash HEENT: Unremarkable RESPIRATORY: Breathing is even, unlabored. Lung sounds are clear   CARDIOVASCULAR: Heart RRR no murmurs, rubs or gallops. No peripheral edema  GASTROINTESTINAL: Abdomen is soft, non-tender, not distended w/ normal bowel sounds.  GENITOURINARY: Bladder non tender, not distended  MUSCULOSKELETAL: No abnormal joints or musculature NEUROLOGIC: Cranial nerves 2-12 grossly intact. Moves all extremities PSYCHIATRIC: Mood and affect ODD, no behavioral issues  Patient Active Problem List   Diagnosis Date Noted  . Vitamin D deficiency 05/02/2016  . Hypertensive emergency 05/01/2016  . Anxiety state 05/01/2016  . Chest pain 05/01/2016  . 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 Hypothyroidism 07/30/2014  . Hyponatremia 07/30/2014  . Hypokalemia 07/30/2014  . Hypochloremia 07/30/2014  . Nausea & vomiting 07/30/2014  . UTI (lower urinary tract infection) 07/30/2014  . Chronic back pain 07/30/2014  . Insomnia 07/30/2014  . Hypertension   . Sciatic neuritis 06/30/2014    CMP     Component Value Date/Time   NA 128 (A) 05/05/2016   K 3.9 05/05/2016   CL 94 (L) 05/01/2016 0443   CO2 25 05/01/2016 0443   GLUCOSE 114 (H) 05/01/2016 0443   BUN 19 05/05/2016   CREATININE 0.6 05/05/2016   CREATININE 0.64 05/01/2016 0443   CALCIUM 9.4 05/01/2016 0443   PROT 7.0 07/30/2014 1646   ALBUMIN 3.5 07/30/2014 1646   AST 36 07/30/2014 1646   ALT 21 07/30/2014 1646   ALKPHOS 55 07/30/2014 1646   BILITOT  1.0 07/30/2014 1646   GFRNONAA >60 05/01/2016 0443   GFRAA >60 05/01/2016 0443    Recent Labs  04/26/16 2052  04/28/16 0336 04/29/16 0254 04/30/16 0449 05/01/16 05/01/16 0443 05/05/16  NA  --   < >  --  127* 126* 129* 129* 128*  K  --   < >  --  3.6 3.2*  --  3.5 3.9  CL  --   < >  --  96* 92*  --  94*  --   CO2  --   < >  --  21* 23  --  25  --   GLUCOSE  --   < >  --  106* 121*  --  114*  --   BUN  --   < >  --  23* 17 18 18 19   CREATININE  --   < >  --  0.86 0.75 0.6 0.64 0.6  CALCIUM  --   < >  --  9.2 9.4  --  9.4  --   MG 2.0  --  1.9  --   --   --   --   --   PHOS 2.4*  --  2.7  --   --   --   --   --   < > = values in this interval not displayed. No results for input(s): AST, ALT, ALKPHOS, BILITOT, PROT, ALBUMIN in the last 8760 hours.  Recent Labs  04/29/16 0254 04/30/16 0449 05/01/16 05/01/16 0443 05/05/16  WBC 10.9* 10.4 10.7 10.7* 9.7  HGB 12.9 14.3  --  13.5 13.9  HCT 37.3 39.7  --  38.2 40  MCV 88.2 88.2  --  88.6  --   PLT 327 306  --  292 352    Recent Labs  04/28/16 0336  CHOL 205*  LDLCALC 131*  TRIG 72   No results found for: [facility] Lab Results  Component Value Date   TSH 5.293 (H) 04/26/2016   Lab Results  Component Value Date   HGBA1C 5.7 (H) 04/28/2016   Lab Results  Component Value Date   CHOL 205 (H) 04/28/2016   HDL 60 04/28/2016   LDLCALC 131 (H) 04/28/2016   TRIG 72 04/28/2016   CHOLHDL 3.4 04/28/2016    Significant Diagnostic Results in last 30 days:  Dg Chest 2 View  Result Date: 04/26/2016 CLINICAL DATA:  Dull chest pain for 2 days. EXAM: CHEST  2 VIEW COMPARISON:  10/29/2014. FINDINGS: The heart size and mediastinal contours are within normal limits. Both lungs are clear. Severe thoracolumbar scoliosis convex RIGHT, unchanged. IMPRESSION: No active cardiopulmonary disease.  Stable exam. Electronically Signed   By: Staci Righter M.D.   On: 04/26/2016 12:06   Ct Head Wo Contrast  Result Date: 04/26/2016 CLINICAL  DATA:  Clinical findings consistent with stroke. Changes in speech and right arm over the last few hours. EXAM: CT HEAD WITHOUT CONTRAST TECHNIQUE: Contiguous axial images were obtained from the base of the skull through the vertex without intravenous contrast. COMPARISON:  MRI brain 03/04/2010.  CT head 12/09/2008 FINDINGS: Brain: There is an acute intraparenchymal hematoma in the region of the left internal capsule measuring 2.2 x 2.3 cm in diameter. Mild surrounding edema. No mass effect or midline shift. Diffuse cerebral atrophy. Mild ventricular dilatation consistent with central atrophy. Low-attenuation changes in the deep white matter consistent with small vessel ischemia. No abnormal extra-axial fluid collections. Gray-white matter junctions are distinct. Basal cisterns are not effaced. Vascular: Diffuse vascular calcifications. The left vertebral artery is densely calcified and ectatic. Appearance is similar to prior studies. Skull: Normal. Negative for fracture or focal lesion. Sinuses/Orbits: No acute finding. Other: None. IMPRESSION: Acute intraparenchymal hemorrhage in the left internal capsule region with mild surrounding edema. Underlying chronic atrophy and small vessel ischemia. These results were called by telephone at the time of interpretation on 04/26/2016 at 10:03 pm to Dr. Cristal Ford , who verbally acknowledged these results. Electronically Signed   By: Lucienne Capers M.D.   On: 04/26/2016 22:11   Mr Jodene Nam Head Wo Contrast  Result Date: 04/27/2016 CLINICAL DATA:  Continued surveillance intraparenchymal hemorrhage. RIGHT-sided weakness. EXAM: MRI HEAD WITHOUT CONTRAST MRA HEAD WITHOUT CONTRAST TECHNIQUE: Multiplanar, multiecho pulse sequences of the brain and surrounding structures were obtained without intravenous  contrast. Angiographic images of the head were obtained using MRA technique without contrast. COMPARISON:  CT head 04/26/2016. FINDINGS: MRI HEAD FINDINGS Brain: Acute  intraparenchymal basal ganglia hemorrhage, epicenter posterior lentiform nucleus and internal capsule on the LEFT, markedly T2 hypointense consistent with deoxyhemoglobin, correlating with CT appearance, having cross-sectional measurements of 24 x 29 x 30 mm (R-L x A-P x C-C), corresponding to a volume of 10.5 mL. Mild surrounding edema. No midline shift. Normal for age cerebral volume. Extensive focal and confluent T2 and FLAIR hyperintensities throughout the white matter, likely chronic microvascular ischemic change. Gradient sequence demonstrates numerous areas of susceptibility (greater than 10) throughout the deep white matter, deep nuclei, brainstem, vermis, and cerebellar hemispheres consistent with microbleeds related to chronic hypertensive cerebrovascular disease. Vascular: Heavily calcified and dolichoectatic distal LEFT vertebral demonstrates susceptibility due to mineralization representing heavy calcification along its V4 segment. Flow voids are maintained throughout. Skull and upper cervical spine: Unremarkable. Sinuses/Orbits: No layering sinus or mastoid fluid. Other: None.  Compared with yesterday's CT, similar appearance. MRA HEAD FINDINGS Dolichoectatic but widely patent internal carotid arteries. Slight BILATERAL cavernous irregularity and outpouchings representing non stenotic atheromatous change. No proximal anterior or middle cerebral artery stenosis or dissection. Dolichoectatic basilar artery swings to the RIGHT. Dolichoectatic distal LEFT vertebral artery mildly dilated without evidence for dissection. RIGHT vertebral artery is diminutive, contributes only to PICA. Slight irregularity distal MCA and PCA branches consistent with intracranial atherosclerotic change. Fetal origin LEFT PCA. No saccular aneurysm. No vascular malformation. IMPRESSION: Acute intraparenchymal hemorrhage as identified on CT, posterior LEFT lentiform nucleus and internal capsule, measuring 24 x 29 x 30 mm  corresponding to a volume of 10.5 mL. Mild surrounding AVM without midline shift. Widespread chronic microbleeds throughout the supratentorial and infratentorial compartment, along with extensive focal and confluent ischemic demyelination consistent with longstanding chronic hypertensive cerebrovascular disease. No intracranial vascular malformation, proximal stenosis, or dissection. Electronically Signed   By: Staci Righter M.D.   On: 04/27/2016 16:23   Mr Brain Wo Contrast  Result Date: 04/27/2016 CLINICAL DATA:  Continued surveillance intraparenchymal hemorrhage. RIGHT-sided weakness. EXAM: MRI HEAD WITHOUT CONTRAST MRA HEAD WITHOUT CONTRAST TECHNIQUE: Multiplanar, multiecho pulse sequences of the brain and surrounding structures were obtained without intravenous contrast. Angiographic images of the head were obtained using MRA technique without contrast. COMPARISON:  CT head 04/26/2016. FINDINGS: MRI HEAD FINDINGS Brain: Acute intraparenchymal basal ganglia hemorrhage, epicenter posterior lentiform nucleus and internal capsule on the LEFT, markedly T2 hypointense consistent with deoxyhemoglobin, correlating with CT appearance, having cross-sectional measurements of 24 x 29 x 30 mm (R-L x A-P x C-C), corresponding to a volume of 10.5 mL. Mild surrounding edema. No midline shift. Normal for age cerebral volume. Extensive focal and confluent T2 and FLAIR hyperintensities throughout the white matter, likely chronic microvascular ischemic change. Gradient sequence demonstrates numerous areas of susceptibility (greater than 10) throughout the deep white matter, deep nuclei, brainstem, vermis, and cerebellar hemispheres consistent with microbleeds related to chronic hypertensive cerebrovascular disease. Vascular: Heavily calcified and dolichoectatic distal LEFT vertebral demonstrates susceptibility due to mineralization representing heavy calcification along its V4 segment. Flow voids are maintained throughout.  Skull and upper cervical spine: Unremarkable. Sinuses/Orbits: No layering sinus or mastoid fluid. Other: None.  Compared with yesterday's CT, similar appearance. MRA HEAD FINDINGS Dolichoectatic but widely patent internal carotid arteries. Slight BILATERAL cavernous irregularity and outpouchings representing non stenotic atheromatous change. No proximal anterior or middle cerebral artery stenosis or dissection. Dolichoectatic basilar artery swings to the RIGHT. Dolichoectatic distal LEFT  vertebral artery mildly dilated without evidence for dissection. RIGHT vertebral artery is diminutive, contributes only to PICA. Slight irregularity distal MCA and PCA branches consistent with intracranial atherosclerotic change. Fetal origin LEFT PCA. No saccular aneurysm. No vascular malformation. IMPRESSION: Acute intraparenchymal hemorrhage as identified on CT, posterior LEFT lentiform nucleus and internal capsule, measuring 24 x 29 x 30 mm corresponding to a volume of 10.5 mL. Mild surrounding AVM without midline shift. Widespread chronic microbleeds throughout the supratentorial and infratentorial compartment, along with extensive focal and confluent ischemic demyelination consistent with longstanding chronic hypertensive cerebrovascular disease. No intracranial vascular malformation, proximal stenosis, or dissection. Electronically Signed   By: Staci Righter M.D.   On: 04/27/2016 16:23    Assessment and Plan  ENTEROCOCCUS FAECALIS UTI -  Renal dosing is levaquin 750 mg daily for 5 days; will monitor    Time spent > 25 min Anne D. Sheppard Coil, MD

## 2016-05-19 ENCOUNTER — Other Ambulatory Visit: Payer: Self-pay | Admitting: *Deleted

## 2016-05-19 NOTE — Patient Outreach (Signed)
[facility]) Care Management  05/19/2016  Patricia Beck Apr 18, 1940 [id number]   Met with patient at facility. Patient states she is still unsure of discharge plans, she states her daughter is coming in from [place] and will be meeting with therapy and will be discussing discharge plans and needs.  Gave patient a [facility] brochure for reference and to review with daughter.   Met with Nikki in business office, facility does not have a SW at this time. She states patient discharge plan is still unsure.  I reviewed that patient is eligible for [facility] services as an addition to any home care needs patient would have and would not interfere or replace any services that facility would put in place upon discharge.   Plan to follow for discharge needs.  Royetta Crochet. Laymond Purser, RN, BSN, [place] [phone]) Business Cell  [phone]) Toll Free Office

## 2016-05-22 ENCOUNTER — Encounter: Payer: Self-pay | Admitting: Internal Medicine

## 2016-05-22 DIAGNOSIS — B952 Enterococcus as the cause of diseases classified elsewhere: Secondary | ICD-10-CM | POA: Insufficient documentation

## 2016-05-22 DIAGNOSIS — N390 Urinary tract infection, site not specified: Principal | ICD-10-CM

## 2016-05-25 DIAGNOSIS — F329 Major depressive disorder, single episode, unspecified: Secondary | ICD-10-CM | POA: Diagnosis not present

## 2016-05-25 DIAGNOSIS — F419 Anxiety disorder, unspecified: Secondary | ICD-10-CM | POA: Diagnosis not present

## 2016-05-25 DIAGNOSIS — G4700 Insomnia, unspecified: Secondary | ICD-10-CM | POA: Diagnosis not present

## 2016-05-30 ENCOUNTER — Encounter: Payer: Self-pay | Admitting: Internal Medicine

## 2016-05-30 ENCOUNTER — Non-Acute Institutional Stay (SKILLED_NURSING_FACILITY): Payer: Medicare Other | Admitting: Internal Medicine

## 2016-05-30 DIAGNOSIS — S06351D Traumatic hemorrhage of left cerebrum with loss of consciousness of 30 minutes or less, subsequent encounter: Secondary | ICD-10-CM | POA: Diagnosis not present

## 2016-05-30 DIAGNOSIS — N8189 Other female genital prolapse: Secondary | ICD-10-CM | POA: Diagnosis not present

## 2016-05-30 DIAGNOSIS — F419 Anxiety disorder, unspecified: Secondary | ICD-10-CM

## 2016-05-30 DIAGNOSIS — Z7189 Other specified counseling: Secondary | ICD-10-CM | POA: Diagnosis not present

## 2016-05-30 NOTE — Progress Notes (Signed)
Location:  [facility] Room Number: 110P Place of Service:  SNF (31)  Noah Delaine. Sheppard Coil, MD  Patient Care Team: Pcp Not In System as PCP - General  Extended Emergency Contact Information Primary Emergency Contact: Kamphausen,Kate Address: [place], [place] Phone: [phone] Work Phone: [phone] Relation: Daughter Secondary Emergency Contact: Townsend,Stephen  [place] Phone: [phone] Relation: Other    Allergies: Aciphex [rabeprazole sodium]; Aleve [naproxen sodium]; Atarax [hydroxyzine]; Belsomra [suvorexant]; Buspirone; Codeine; Erythromycin; Keflex [cephalexin]; Lidocaine; Prednisone; Tramadol; and Zoloft [sertraline hcl]  Chief Complaint  Patient presents with  . Acute Visit    Acute    HPI: Patient is 76 y.o. female whose daughter has asked me to call her in [place] to discuss Mom's problem with anxiety and her refusal to use any "mind altering " meds for it.   Past Medical History:  Diagnosis Date  . Cataract   . Fibroma 2006  . Hypertension   . Thyroid disease     Past Surgical History:  Procedure Laterality Date  . EYE SURGERY    . fibroma      Allergies as of 05/30/2016      Reactions   Aciphex [rabeprazole Sodium]    Aleve [naproxen Sodium]    Atarax [hydroxyzine]    Belsomra [suvorexant] Other (See Comments)   Nightmares   Buspirone    Codeine    Erythromycin    Keflex [cephalexin]    Lidocaine    Prednisone    Insomnia, nausea, hot flush   Tramadol    Zoloft [sertraline Hcl]       Medication List       Accurate as of 05/30/16  9:38 AM. Always use your most recent med list.          acetaminophen 500 MG tablet Commonly known as:  TYLENOL Take 1,000 mg by mouth every 8 (eight) hours as needed for moderate pain (pain).   amLODipine 10 MG tablet Commonly known as:  NORVASC Take 1 tablet (10 mg total) by mouth daily.     bisoprolol 10 MG tablet Commonly known as:  ZEBETA Take 1 tablet (10 mg total) by mouth daily.   calcium citrate 950 MG tablet Commonly known as:  CALCITRATE - dosed in mg elemental calcium Take 200 mg of elemental calcium by mouth daily.   citalopram 10 MG tablet Commonly known as:  CELEXA Take 1 tablet (10 mg total) by mouth daily.   clonazePAM 0.5 MG tablet Commonly known as:  KLONOPIN Take 0.5 mg by mouth at bedtime.   methocarbamol 500 MG tablet Commonly known as:  ROBAXIN Take 500 mg by mouth every 8 (eight) hours as needed for muscle spasms.   oseltamivir 75 MG capsule Commonly known as:  TAMIFLU Take 75 mg by mouth daily.   polyethylene glycol packet Commonly known as:  MIRALAX / GLYCOLAX Take 17 g by mouth daily.   potassium chloride 20 MEQ packet Commonly known as:  KLOR-CON Take 20 mEq by mouth 2 (two) times daily.   senna-docusate 8.6-50 MG tablet Commonly known as:  Senokot-S Take 2 tablets by mouth 2 (two) times daily.   thyroid 90 MG tablet Commonly known as:  ARMOUR Take 1 tablet (90 mg total) by mouth daily before breakfast.   traMADol 50 MG tablet Commonly known as:  ULTRAM Take 50 mg by mouth every 12 (twelve) hours.  scheduled   traMADol 50 MG tablet Commonly known as:  ULTRAM Take 50 mg by mouth every 12 (twelve) hours as needed for moderate pain.   vitamin C 500 MG tablet Commonly known as:  ASCORBIC ACID Take 1,000 mg by mouth daily.   Vitamin D3 5000 units Caps Take 1 capsule by mouth daily.   vitamin E 100 UNIT capsule Take 100 Units by mouth daily.   zolpidem 5 MG tablet Commonly known as:  AMBIEN Take 1 tablet (5 mg total) by mouth at bedtime.       No orders of the defined types were placed in this encounter.    There is no immunization history on file for this patient.  Social History  Substance Use Topics  . Smoking status: Never Smoker  . Smokeless tobacco: Never Used  . Alcohol use No    Review of  Systems  DATA OBTAINED: from patient, nurse GENERAL:  no fevers, fatigue, appetite changes SKIN: No itching, rash HEENT: No complaint RESPIRATORY: No cough, wheezing, SOB CARDIAC: No chest pain, palpitations, lower extremity edema  GI: No abdominal pain, No N/V/D or constipation, No heartburn or reflux  GU: No dysuria, frequency or urgency, or incontinence  MUSCULOSKELETAL: No unrelieved bone/joint pain NEUROLOGIC: No headache, dizziness  PSYCHIATRIC: debilitating anxiety   Vitals:   05/30/16 0928  BP: (!) 110/58  Pulse: 64  Resp: 18  Temp: (!) 96.9 F (36.1 C)   Body mass index is 19.42 kg/m. Physical Exam  GENERAL APPEARANCE: Alert, conversant, No acute distress  SKIN: No diaphoresis rash HEENT: Unremarkable RESPIRATORY: Breathing is even, unlabored. Lung sounds are clear   CARDIOVASCULAR: Heart RRR no murmurs, rubs or gallops. No peripheral edema  GASTROINTESTINAL: Abdomen is soft, non-tender, not distended w/ normal bowel sounds.  GENITOURINARY: Bladder non tender, not distended  MUSCULOSKELETAL: No abnormal joints or musculature NEUROLOGIC: Cranial nerves 2-12 grossly intact. Moves all extremities PSYCHIATRIC: Mood and affect very anxious, negative, perseverates  Patient Active Problem List   Diagnosis Date Noted  . Enterococcus UTI 05/22/2016  . Vitamin D deficiency 05/02/2016  . Hypertensive emergency 05/01/2016  . Anxiety state 05/01/2016  . Chest pain 05/01/2016  . 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 Hypothyroidism 07/30/2014  . Hyponatremia 07/30/2014  . Hypokalemia 07/30/2014  . Hypochloremia 07/30/2014  . Nausea & vomiting 07/30/2014  . UTI (lower urinary tract infection) 07/30/2014  . Chronic back pain 07/30/2014  . Insomnia 07/30/2014  . Hypertension   . Sciatic neuritis 06/30/2014    CMP     Component Value Date/Time   NA 128 (A) 05/05/2016   K 3.9 05/05/2016   CL 94 (L) 05/01/2016 0443   CO2 25  05/01/2016 0443   GLUCOSE 114 (H) 05/01/2016 0443   BUN 19 05/05/2016   CREATININE 0.6 05/05/2016   CREATININE 0.64 05/01/2016 0443   CALCIUM 9.4 05/01/2016 0443   PROT 7.0 07/30/2014 1646   ALBUMIN 3.5 07/30/2014 1646   AST 36 07/30/2014 1646   ALT 21 07/30/2014 1646   ALKPHOS 55 07/30/2014 1646   BILITOT 1.0 07/30/2014 1646   GFRNONAA >60 05/01/2016 0443   GFRAA >60 05/01/2016 0443    Recent Labs  04/26/16 2052  04/28/16 0336 04/29/16 0254 04/30/16 0449 05/01/16 05/01/16 0443 05/05/16  NA  --   < >  --  127* 126* 129* 129* 128*  K  --   < >  --  3.6 3.2*  --  3.5 3.9  CL  --   < >  --  96* 92*  --  94*  --   CO2  --   < >  --  21* 23  --  25  --   GLUCOSE  --   < >  --  106* 121*  --  114*  --   BUN  --   < >  --  23* 17 18 18 19   CREATININE  --   < >  --  0.86 0.75 0.6 0.64 0.6  CALCIUM  --   < >  --  9.2 9.4  --  9.4  --   MG 2.0  --  1.9  --   --   --   --   --   PHOS 2.4*  --  2.7  --   --   --   --   --   < > = values in this interval not displayed. No results for input(s): AST, ALT, ALKPHOS, BILITOT, PROT, ALBUMIN in the last 8760 hours.  Recent Labs  04/29/16 0254 04/30/16 0449 05/01/16 05/01/16 0443 05/05/16  WBC 10.9* 10.4 10.7 10.7* 9.7  HGB 12.9 14.3  --  13.5 13.9  HCT 37.3 39.7  --  38.2 40  MCV 88.2 88.2  --  88.6  --   PLT 327 306  --  292 352    Recent Labs  04/28/16 0336  CHOL 205*  LDLCALC 131*  TRIG 72   No results found for: [facility] Lab Results  Component Value Date   TSH 5.293 (H) 04/26/2016   Lab Results  Component Value Date   HGBA1C 5.7 (H) 04/28/2016   Lab Results  Component Value Date   CHOL 205 (H) 04/28/2016   HDL 60 04/28/2016   LDLCALC 131 (H) 04/28/2016   TRIG 72 04/28/2016   CHOLHDL 3.4 04/28/2016    Significant Diagnostic Results in last 30 days:  No results found.  Assessment and Plan  [place] / ICH/ANXIETY - I spoke with nursing and rehab before I called pt's daughter. We  discussed mainly Mom's anxiety and whether I would be willing to work with daughter on different therapies, specifically hemp CBD oil. I will do some research and we will meet when she comes to [place] on Friday.   Time spent > 35 min;> 50% of time with patient was spent reviewing records, labs, tests and studies, counseling and developing plan of care  Noah Delaine. Sheppard Coil, MD

## 2016-06-02 ENCOUNTER — Encounter: Payer: Self-pay | Admitting: Internal Medicine

## 2016-06-02 ENCOUNTER — Non-Acute Institutional Stay (SKILLED_NURSING_FACILITY): Payer: Medicare Other | Admitting: Internal Medicine

## 2016-06-02 DIAGNOSIS — F419 Anxiety disorder, unspecified: Secondary | ICD-10-CM | POA: Diagnosis not present

## 2016-06-02 DIAGNOSIS — I610 Nontraumatic intracerebral hemorrhage in hemisphere, subcortical: Secondary | ICD-10-CM | POA: Diagnosis not present

## 2016-06-02 DIAGNOSIS — Z7189 Other specified counseling: Secondary | ICD-10-CM | POA: Diagnosis not present

## 2016-06-02 NOTE — Progress Notes (Addendum)
Location:  [place] Room Number: 110P Place of Service:  SNF (31)  Patricia Beck. Patricia Coil, MD  Patient Care Team: Pcp Not In System as PCP - General  Extended Emergency Contact Information Primary Emergency Contact: Patricia Beck,Patricia Beck Address: [place], [place] Phone: [phone] Work Phone: [phone] Relation: Daughter Secondary Emergency Contact: Townsend,Stephen  [place] Phone: [phone] Relation: Other    Allergies: Aciphex [rabeprazole sodium]; Aleve [naproxen sodium]; Atarax [hydroxyzine]; Belsomra [suvorexant]; Buspirone; Codeine; Erythromycin; Keflex [cephalexin]; Lidocaine; Prednisone; Tramadol; and Zoloft [sertraline hcl]  Chief Complaint  Patient presents with  . Medical Management of Chronic Issues    Routine Visit    HPI: Patient is 76 y.o. female whose daughter is here from [place] and we are meeting to discuss Mom's progress and prognosis, in particular interventions to help her anxiety which is life long.  Past Medical History:  Diagnosis Date  . Cataract   . Fibroma 2006  . Hypertension   . Thyroid disease     Past Surgical History:  Procedure Laterality Date  . EYE SURGERY    . fibroma      Allergies as of 06/02/2016      Reactions   Aciphex [rabeprazole Sodium]    Aleve [naproxen Sodium]    Atarax [hydroxyzine]    Belsomra [suvorexant] Other (See Comments)   Nightmares   Buspirone    Codeine    Erythromycin    Keflex [cephalexin]    Lidocaine    Prednisone    Insomnia, nausea, hot flush   Tramadol    Zoloft [sertraline Hcl]       Medication List       Accurate as of 06/02/16  9:22 AM. Always use your most recent med list.          acetaminophen 500 MG tablet Commonly known as:  TYLENOL Take 1,000 mg by mouth every 8 (eight) hours as needed for moderate pain (pain).   amLODipine 10 MG tablet Commonly known as:  NORVASC Take  1 tablet (10 mg total) by mouth daily.   bisoprolol 10 MG tablet Commonly known as:  ZEBETA Take 1 tablet (10 mg total) by mouth daily.   calcium citrate 950 MG tablet Commonly known as:  CALCITRATE - dosed in mg elemental calcium Take 200 mg of elemental calcium by mouth daily.   citalopram 10 MG tablet Commonly known as:  CELEXA Take 1 tablet (10 mg total) by mouth daily.   clonazePAM 0.5 MG tablet Commonly known as:  KLONOPIN Take 0.5 mg by mouth at bedtime.   methocarbamol 500 MG tablet Commonly known as:  ROBAXIN Take 500 mg by mouth every 8 (eight) hours as needed for muscle spasms.   polyethylene glycol packet Commonly known as:  MIRALAX / GLYCOLAX Take 17 g by mouth daily.   potassium chloride 20 MEQ packet Commonly known as:  KLOR-CON Take 20 mEq by mouth 2 (two) times daily.   senna-docusate 8.6-50 MG tablet Commonly known as:  Senokot-S Take 2 tablets by mouth 2 (two) times daily.   thyroid 90 MG tablet Commonly known as:  ARMOUR Take 1 tablet (90 mg total) by mouth daily before breakfast.   traMADol 50 MG tablet Commonly known as:  ULTRAM Take 50 mg by mouth every 12 (twelve) hours. scheduled   traMADol 50 MG tablet Commonly known as:  ULTRAM Take 50 mg by mouth  every 12 (twelve) hours as needed for moderate pain.   vitamin C 500 MG tablet Commonly known as:  ASCORBIC ACID Take 1,000 mg by mouth daily.   Vitamin D3 5000 units Caps Take 1 capsule by mouth daily.   vitamin E 100 UNIT capsule Take 100 Units by mouth daily.   zolpidem 5 MG tablet Commonly known as:  AMBIEN Take 1 tablet (5 mg total) by mouth at bedtime.       No orders of the defined types were placed in this encounter.   Immunization History  Administered Date(s) Administered  . PPD Test 05/01/2016    Social History  Substance Use Topics  . Smoking status: Never Smoker  . Smokeless tobacco: Never Used  . Alcohol use No    Review of Systems  DATA OBTAINED: from  patient, daughter GENERAL:  no fevers, fatigue, appetite changes SKIN: No itching, rash HEENT: No complaint RESPIRATORY: No cough, wheezing, SOB CARDIAC: No chest pain, palpitations, lower extremity edema  GI: No abdominal pain, No N/V/D or constipation, No heartburn or reflux  GU: No dysuria, frequency or urgency, or incontinence  MUSCULOSKELETAL: No unrelieved bone/joint pain NEUROLOGIC: No headache, dizziness; + back pain PSYCHIATRIC: No overt anxiety or sadness  Vitals:   06/02/16 0914  BP: (!) 142/76  Pulse: 68  Resp: 18  Temp: 97.2 F (36.2 C)   Body mass index is 19.42 kg/m. Physical Exam  GENERAL APPEARANCE: Alert, conversant, No acute distress  SKIN: No diaphoresis rash HEENT: Unremarkable RESPIRATORY: Breathing is even, unlabored. Lung sounds are clear   CARDIOVASCULAR: Heart RRR no murmurs, rubs or gallops. No peripheral edema  GASTROINTESTINAL: Abdomen is soft, non-tender, not distended w/ normal bowel sounds.  GENITOURINARY: Bladder non tender, not distended  MUSCULOSKELETAL: No abnormal joints or musculature NEUROLOGIC: Cranial nerves 2-12 grossly intact. Moves all extremities PSYCHIATRIC: pt continually perseverates over her pain anxiety and continually says " I don't know how to do that"  Patient Active Problem List   Diagnosis Date Noted  . Enterococcus UTI 05/22/2016  . Vitamin D deficiency 05/02/2016  . Hypertensive emergency 05/01/2016  . Anxiety state 05/01/2016  . Chest pain 05/01/2016  . 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 Hypothyroidism 07/30/2014  . Hyponatremia 07/30/2014  . Hypokalemia 07/30/2014  . Hypochloremia 07/30/2014  . Nausea & vomiting 07/30/2014  . UTI (lower urinary tract infection) 07/30/2014  . Chronic back pain 07/30/2014  . Insomnia 07/30/2014  . Hypertension   . Sciatic neuritis 06/30/2014    CMP     Component Value Date/Time   NA 128 (A) 05/05/2016   K 3.9 05/05/2016   CL  94 (L) 05/01/2016 0443   CO2 25 05/01/2016 0443   GLUCOSE 114 (H) 05/01/2016 0443   BUN 19 05/05/2016   CREATININE 0.6 05/05/2016   CREATININE 0.64 05/01/2016 0443   CALCIUM 9.4 05/01/2016 0443   PROT 7.0 07/30/2014 1646   ALBUMIN 3.5 07/30/2014 1646   AST 36 07/30/2014 1646   ALT 21 07/30/2014 1646   ALKPHOS 55 07/30/2014 1646   BILITOT 1.0 07/30/2014 1646   GFRNONAA >60 05/01/2016 0443   GFRAA >60 05/01/2016 0443    Recent Labs  04/26/16 2052  04/28/16 0336 04/29/16 0254 04/30/16 0449 05/01/16 05/01/16 0443 05/05/16  NA  --   < >  --  127* 126* 129* 129* 128*  K  --   < >  --  3.6 3.2*  --  3.5 3.9  CL  --   < >  --  96* 92*  --  94*  --   CO2  --   < >  --  21* 23  --  25  --   GLUCOSE  --   < >  --  106* 121*  --  114*  --   BUN  --   < >  --  23* 17 18 18 19   CREATININE  --   < >  --  0.86 0.75 0.6 0.64 0.6  CALCIUM  --   < >  --  9.2 9.4  --  9.4  --   MG 2.0  --  1.9  --   --   --   --   --   PHOS 2.4*  --  2.7  --   --   --   --   --   < > = values in this interval not displayed. No results for input(s): AST, ALT, ALKPHOS, BILITOT, PROT, ALBUMIN in the last 8760 hours.  Recent Labs  04/29/16 0254 04/30/16 0449 05/01/16 05/01/16 0443 05/05/16  WBC 10.9* 10.4 10.7 10.7* 9.7  HGB 12.9 14.3  --  13.5 13.9  HCT 37.3 39.7  --  38.2 40  MCV 88.2 88.2  --  88.6  --   PLT 327 306  --  292 352    Recent Labs  04/28/16 0336  CHOL 205*  LDLCALC 131*  TRIG 72   No results found for: [facility] Lab Results  Component Value Date   TSH 5.293 (H) 04/26/2016   Lab Results  Component Value Date   HGBA1C 5.7 (H) 04/28/2016   Lab Results  Component Value Date   CHOL 205 (H) 04/28/2016   HDL 60 04/28/2016   LDLCALC 131 (H) 04/28/2016   TRIG 72 04/28/2016   CHOLHDL 3.4 04/28/2016    Significant Diagnostic Results in last 30 days:  No results found.  Assessment and Plan  ENCOUNTER FOR FAMILY CONFERENCE WITH PT PRESENT/ S/P SUBCORTICAL BLEED/ ANXIETY- I  spoke with rehab;pt can do eveything strength wise but anxiety is constantly getting in the way; per friend who visited her yesterday this anxiety is not new ( ie not from bleed) but has been going on for years. Daughter would like to try Hemp CBD oil which necessitated some research and a discussion with the DON. Daughter has noted that benadryl really helped her mother last night and Mom is not opposed to taking it. Will work with that and the hemp oil while daughter is here . Starting with benadryl 12.5 mg BID and 25 mg at 8p. Hemp oil at 6p. Will discuss results on Monday.     Time spent > 35 min;> 50% of time with patient was spent reviewing records, labs, tests and studies, counseling and developing plan of care  Patricia Beck. Patricia Coil, MD

## 2016-06-03 ENCOUNTER — Encounter: Payer: Self-pay | Admitting: Internal Medicine

## 2016-06-07 ENCOUNTER — Other Ambulatory Visit: Payer: Self-pay | Admitting: *Deleted

## 2016-06-07 LAB — BASIC METABOLIC PANEL
BUN: 22 mg/dL — AB (ref 4–21)
Creatinine: 0.6 mg/dL (ref 0.5–1.1)
Glucose: 101 mg/dL
Potassium: 4.4 mmol/L (ref 3.4–5.3)
Sodium: 134 mmol/L — AB (ref 137–147)

## 2016-06-07 LAB — CBC AND DIFFERENTIAL
HEMATOCRIT: 39 % (ref 36–46)
HEMOGLOBIN: 13 g/dL (ref 12.0–16.0)
PLATELETS: 265 10*3/uL (ref 150–399)
WBC: 7.3 10^3/mL

## 2016-06-07 NOTE — Patient Outreach (Signed)
[facility]) Care Management  06/07/2016  Patricia Beck 1940-08-04 [id number]  Met with [name], who is assisting with discharge planning at facility, as no SW on site at this time. She reports that she received notice from therapy that they are discharging patient 2/27. She plans to call daughter later today to give notice. She reports that the discharge plan is either for patient to go into ALF or to go home. Facility has let daughter know that patient does not go to ALF they feel she needs assistance in the home. They have provided ALF and Private pay aide service brochures to daughter.  Daughter lives in [place] and just went back to [place] yesterday.   Met with patient at bedside, patient requested RNCM call daughter regarding [facility] program.   Plan to call daughter later this week after facility has given notice. Will review discharge plan and [facility] care management services.   Royetta Crochet. Laymond Purser, RN, BSN, [place] [phone]) Business Cell  [phone]) Toll Free Office

## 2016-06-08 ENCOUNTER — Ambulatory Visit (HOSPITAL_COMMUNITY): Payer: Medicare Other | Admitting: Psychiatry

## 2016-06-09 ENCOUNTER — Other Ambulatory Visit: Payer: Self-pay | Admitting: *Deleted

## 2016-06-09 NOTE — Patient Outreach (Signed)
Outreach call to patient daughter, Joellen Jersey per patient request. Unable to reach, left VM with RNCM contact requesting call. Plan to speak with daughter re: [facility] Care management services and patient discharge plan. Will attempt follow up. Royetta Crochet. Laymond Purser, RN, BSN, [place] [phone]) Business Cell  [phone]) Toll Free Office

## 2016-06-12 ENCOUNTER — Other Ambulatory Visit: Payer: Self-pay | Admitting: *Deleted

## 2016-06-12 ENCOUNTER — Encounter: Payer: Self-pay | Admitting: Internal Medicine

## 2016-06-12 ENCOUNTER — Non-Acute Institutional Stay (SKILLED_NURSING_FACILITY): Payer: Medicare Other | Admitting: Internal Medicine

## 2016-06-12 DIAGNOSIS — E871 Hypo-osmolality and hyponatremia: Secondary | ICD-10-CM

## 2016-06-12 DIAGNOSIS — E222 Syndrome of inappropriate secretion of antidiuretic hormone: Secondary | ICD-10-CM | POA: Diagnosis not present

## 2016-06-12 DIAGNOSIS — E034 Atrophy of thyroid (acquired): Secondary | ICD-10-CM

## 2016-06-12 DIAGNOSIS — M549 Dorsalgia, unspecified: Secondary | ICD-10-CM | POA: Diagnosis not present

## 2016-06-12 DIAGNOSIS — E876 Hypokalemia: Secondary | ICD-10-CM

## 2016-06-12 DIAGNOSIS — I610 Nontraumatic intracerebral hemorrhage in hemisphere, subcortical: Secondary | ICD-10-CM | POA: Diagnosis not present

## 2016-06-12 DIAGNOSIS — I161 Hypertensive emergency: Secondary | ICD-10-CM | POA: Diagnosis not present

## 2016-06-12 DIAGNOSIS — G8929 Other chronic pain: Secondary | ICD-10-CM | POA: Diagnosis not present

## 2016-06-12 DIAGNOSIS — E559 Vitamin D deficiency, unspecified: Secondary | ICD-10-CM

## 2016-06-12 DIAGNOSIS — F411 Generalized anxiety disorder: Secondary | ICD-10-CM

## 2016-06-12 DIAGNOSIS — F5101 Primary insomnia: Secondary | ICD-10-CM | POA: Diagnosis not present

## 2016-06-12 DIAGNOSIS — R339 Retention of urine, unspecified: Secondary | ICD-10-CM | POA: Diagnosis not present

## 2016-06-12 NOTE — Progress Notes (Signed)
Location:  [place] Room Number: 110P Place of Service:  SNF (31)  Patricia Beck. Sheppard Coil, MD  PCP: Pcp Not In System Patient Care Team: Pcp Not In System as PCP - General  Extended Emergency Contact Information Primary Emergency Contact: Kamphausen,Kate Address: [place], [place] Phone: [phone] Work Phone: [phone] Relation: Daughter Secondary Emergency Contact: Townsend,Stephen  [place] Phone: [phone] Relation: Other  Allergies  Allergen Reactions  . Aciphex [Rabeprazole Sodium]   . Aleve [Naproxen Sodium]   . Atarax [Hydroxyzine]   . Belsomra [Suvorexant] Other (See Comments)    Nightmares  . Buspirone   . Codeine   . Erythromycin   . Keflex [Cephalexin]   . Lidocaine   . Prednisone     Insomnia, nausea, hot flush   . Tramadol   . Zoloft [Sertraline Hcl]     Chief Complaint  Patient presents with  . Discharge Note    Discharged from SNF    HPI:  76 y.o. female  who was admitted to [facility] from 1/10-15 with increasing back pain, anxiety, andhyponatremiawho developed right sided weaknesswhile ambulating to the bedside commode 04/26/2016 while in the stepdown unit for elevated BP.Marland Kitchen CT showed left basal ganglia/internal capsule hemorrhage. During hospital all parameters improved with  emphasis on risk factor control. Pt was admitted to SNF for OT/PT and is now ready to be d/c to home.     Past Medical History:  Diagnosis Date  . Cataract   . Fibroma 2006  . Hypertension   . Thyroid disease     Past Surgical History:  Procedure Laterality Date  . EYE SURGERY    . fibroma       reports that she has never smoked. She has never used smokeless tobacco. She reports that she does not drink alcohol or use drugs. Social History   Social History  . Marital status: Widowed    Spouse name: N/A  . Number of children: N/A  . Years of  education: N/A   Occupational History  . Not on file.   Social History Main Topics  . Smoking status: Never Smoker  . Smokeless tobacco: Never Used  . Alcohol use No  . Drug use: No  . Sexual activity: Not on file   Other Topics Concern  . Not on file   Social History Narrative  . No narrative on file    Pertinent  Health Maintenance Due  Topic Date Due  . MAMMOGRAM  06/19/1991  . COLONOSCOPY  06/19/1991  . DEXA SCAN  06/19/2006  . PNA vac Low Risk Adult (1 of 2 - PCV13) 06/19/2006  . INFLUENZA VACCINE  11/16/2015    Medications: Allergies as of 06/12/2016      Reactions   Aciphex [rabeprazole Sodium]    Aleve [naproxen Sodium]    Atarax [hydroxyzine]    Belsomra [suvorexant] Other (See Comments)   Nightmares   Buspirone    Codeine    Erythromycin    Keflex [cephalexin]    Lidocaine    Prednisone    Insomnia, nausea, hot flush   Tramadol    Zoloft [sertraline Hcl]       Medication List       Accurate as of 06/12/16  9:50 AM. Always use your most recent med list.          acetaminophen 500 MG tablet  Commonly known as:  TYLENOL Take 1,000 mg by mouth every 8 (eight) hours as needed for moderate pain (pain).   amLODipine 10 MG tablet Commonly known as:  NORVASC Take 1 tablet (10 mg total) by mouth daily.   bisoprolol 10 MG tablet Commonly known as:  ZEBETA Take 1 tablet (10 mg total) by mouth daily.   calcium citrate 950 MG tablet Commonly known as:  CALCITRATE - dosed in mg elemental calcium Take 200 mg of elemental calcium by mouth daily.   citalopram 10 MG tablet Commonly known as:  CELEXA Take 1 tablet (10 mg total) by mouth daily.   clonazePAM 0.5 MG tablet Commonly known as:  KLONOPIN Take 0.5 mg by mouth at bedtime.   Diphenhydramine-PE-APAP 12.5-5-325 MG Tabs Take 12.5 mg by mouth See admin instructions. 1 tablet 12.5 mg at 8 am and 1 tablet (12.5) at 2 pm. Then take 2 tablets (25 mg) at bedtime.   Melatonin 5 MG Tabs Take 3  tablets by mouth at bedtime. 8 pm   methocarbamol 500 MG tablet Commonly known as:  ROBAXIN Take 500 mg by mouth every 8 (eight) hours as needed for muscle spasms.   polyethylene glycol packet Commonly known as:  MIRALAX / GLYCOLAX Take 17 g by mouth daily.   potassium chloride 20 MEQ packet Commonly known as:  KLOR-CON Take 20 mEq by mouth 2 (two) times daily.   QUINOA KALE & HEMP Liqd Take 1 drop by mouth daily after supper. Under tongue   senna-docusate 8.6-50 MG tablet Commonly known as:  Senokot-S Take 2 tablets by mouth 2 (two) times daily.   thyroid 90 MG tablet Commonly known as:  ARMOUR Take 1 tablet (90 mg total) by mouth daily before breakfast.   traMADol 50 MG tablet Commonly known as:  ULTRAM Take 50 mg by mouth every 12 (twelve) hours. scheduled   traMADol 50 MG tablet Commonly known as:  ULTRAM Take 50 mg by mouth every 12 (twelve) hours as needed for moderate pain.   vitamin C 500 MG tablet Commonly known as:  ASCORBIC ACID Take 1,000 mg by mouth daily.   Vitamin D3 5000 units Caps Take 1 capsule by mouth daily.   vitamin E 100 UNIT capsule Take 100 Units by mouth daily.        Vitals:   06/12/16 0937  BP: (!) 92/52  Pulse: 72  Resp: 18  Temp: 97.6 F (36.4 C)  SpO2: 98%  Weight: 110 lb 3.2 oz (50 kg)  Height: 5\' 2"  (1.575 m)   Body mass index is 20.16 kg/m.  Physical Exam  GENERAL APPEARANCE: Alert, conversant. No acute distress.  HEENT: Unremarkable. RESPIRATORY: Breathing is even, unlabored. Lung sounds are clear   CARDIOVASCULAR: Heart RRR no murmurs, rubs or gallops. No peripheral edema.  GASTROINTESTINAL: Abdomen is soft, non-tender, not distended w/ normal bowel sounds.  NEUROLOGIC: Cranial nerves 2-12 grossly intact. Moves all extremities   Labs reviewed: Basic Metabolic Panel:  Recent Labs  04/26/16 2052  04/28/16 0336 04/29/16 0254 04/30/16 0449  05/01/16 0443 05/05/16 06/07/16  NA  --   < >  --  127* 126*  < >  129* 128* 134*  K  --   < >  --  3.6 3.2*  --  3.5 3.9 4.4  CL  --   < >  --  96* 92*  --  94*  --   --   CO2  --   < >  --  21* 23  --  25  --   --   GLUCOSE  --   < >  --  106* 121*  --  114*  --   --   BUN  --   < >  --  23* 17  < > 18 19 22*  CREATININE  --   < >  --  0.86 0.75  < > 0.64 0.6 0.6  CALCIUM  --   < >  --  9.2 9.4  --  9.4  --   --   MG 2.0  --  1.9  --   --   --   --   --   --   PHOS 2.4*  --  2.7  --   --   --   --   --   --   < > = values in this interval not displayed. No results found for: [facility] Liver Function Tests: No results for input(s): AST, ALT, ALKPHOS, BILITOT, PROT, ALBUMIN in the last 8760 hours. No results for input(s): LIPASE, AMYLASE in the last 8760 hours. No results for input(s): AMMONIA in the last 8760 hours. CBC:  Recent Labs  04/29/16 0254 04/30/16 0449  05/01/16 0443 05/05/16 06/07/16  WBC 10.9* 10.4  < > 10.7* 9.7 7.3  HGB 12.9 14.3  --  13.5 13.9 13.0  HCT 37.3 39.7  --  38.2 40 39  MCV 88.2 88.2  --  88.6  --   --   PLT 327 306  --  292 352 265  < > = values in this interval not displayed. Lipid  Recent Labs  04/28/16 0336  CHOL 205*  HDL 60  LDLCALC 131*  TRIG 72   Cardiac Enzymes:  Recent Labs  04/26/16 2052  TROPONINI <0.03   BNP: No results for input(s): BNP in the last 8760 hours. CBG:  Recent Labs  04/26/16 2128  GLUCAP 150*    Procedures and Imaging Studies During Stay: No results found.  Assessment/Plan:   No diagnosis found.   Patient is being discharged with the following home health services:  OT/PT/Nursing  Patient is being discharged with the following durable medical equipment:  none  Patient has been advised to f/u with their PCP in 1-2 weeks to bring them up to date on their rehab stay.  Social services at facility was responsible for arranging this appointment.  Pt was provided with a 30 day supply of prescriptions for medications and refills must be obtained from their PCP.  For  controlled substances, a more limited supply may be provided adequate until PCP appointment only.   Time spent > 30 min;> 50% of time with patient was spent reviewing records, labs, tests and studies, counseling and developing plan of care  Patricia Beck. Sheppard Coil, MD

## 2016-06-12 NOTE — Patient Outreach (Signed)
Call attempt to patient daughter Roseanne Kaufman, no answer, left VM requesting call back.   Plan await call back, If no call back will mail letter re: [facility] program to patient home and sign off.   Royetta Crochet. Laymond Purser, RN, BSN, [place] Acute Care Coordinator [phone]

## 2016-06-14 DIAGNOSIS — E039 Hypothyroidism, unspecified: Secondary | ICD-10-CM | POA: Diagnosis not present

## 2016-06-14 DIAGNOSIS — F419 Anxiety disorder, unspecified: Secondary | ICD-10-CM | POA: Diagnosis not present

## 2016-06-14 DIAGNOSIS — I69351 Hemiplegia and hemiparesis following cerebral infarction affecting right dominant side: Secondary | ICD-10-CM | POA: Diagnosis not present

## 2016-06-14 DIAGNOSIS — G8929 Other chronic pain: Secondary | ICD-10-CM | POA: Diagnosis not present

## 2016-06-14 DIAGNOSIS — M549 Dorsalgia, unspecified: Secondary | ICD-10-CM | POA: Diagnosis not present

## 2016-06-14 DIAGNOSIS — I10 Essential (primary) hypertension: Secondary | ICD-10-CM | POA: Diagnosis not present

## 2016-06-16 ENCOUNTER — Other Ambulatory Visit: Payer: Self-pay | Admitting: *Deleted

## 2016-06-16 ENCOUNTER — Encounter: Payer: Self-pay | Admitting: *Deleted

## 2016-06-16 DIAGNOSIS — F419 Anxiety disorder, unspecified: Secondary | ICD-10-CM | POA: Diagnosis not present

## 2016-06-16 DIAGNOSIS — I10 Essential (primary) hypertension: Secondary | ICD-10-CM | POA: Diagnosis not present

## 2016-06-16 DIAGNOSIS — M549 Dorsalgia, unspecified: Secondary | ICD-10-CM | POA: Diagnosis not present

## 2016-06-16 DIAGNOSIS — I69351 Hemiplegia and hemiparesis following cerebral infarction affecting right dominant side: Secondary | ICD-10-CM | POA: Diagnosis not present

## 2016-06-16 DIAGNOSIS — G8929 Other chronic pain: Secondary | ICD-10-CM | POA: Diagnosis not present

## 2016-06-16 DIAGNOSIS — E039 Hypothyroidism, unspecified: Secondary | ICD-10-CM | POA: Diagnosis not present

## 2016-06-16 NOTE — Patient Outreach (Signed)
Spoke with patient daughter, Patricia Beck regarding [facility] care management services per patient request. She states she did not see our brochure and requests one for review and she will consider program. She denies any needs at this time, she does live in [place] but is in [place] to settle her mom in post discharge from SNF.    Plan to mail out brochure and will sign off. Royetta Crochet. Laymond Purser, RN, BSN, [place] [phone]) Business Cell  [phone]) Toll Free Office

## 2016-06-19 DIAGNOSIS — G4700 Insomnia, unspecified: Secondary | ICD-10-CM | POA: Diagnosis not present

## 2016-06-19 DIAGNOSIS — N390 Urinary tract infection, site not specified: Secondary | ICD-10-CM | POA: Diagnosis not present

## 2016-06-19 DIAGNOSIS — I10 Essential (primary) hypertension: Secondary | ICD-10-CM | POA: Diagnosis not present

## 2016-06-19 DIAGNOSIS — I638 Other cerebral infarction: Secondary | ICD-10-CM | POA: Diagnosis not present

## 2016-06-19 DIAGNOSIS — M5440 Lumbago with sciatica, unspecified side: Secondary | ICD-10-CM | POA: Diagnosis not present

## 2016-06-20 DIAGNOSIS — M549 Dorsalgia, unspecified: Secondary | ICD-10-CM | POA: Diagnosis not present

## 2016-06-20 DIAGNOSIS — I10 Essential (primary) hypertension: Secondary | ICD-10-CM | POA: Diagnosis not present

## 2016-06-20 DIAGNOSIS — F419 Anxiety disorder, unspecified: Secondary | ICD-10-CM | POA: Diagnosis not present

## 2016-06-20 DIAGNOSIS — G8929 Other chronic pain: Secondary | ICD-10-CM | POA: Diagnosis not present

## 2016-06-20 DIAGNOSIS — E039 Hypothyroidism, unspecified: Secondary | ICD-10-CM | POA: Diagnosis not present

## 2016-06-20 DIAGNOSIS — I69351 Hemiplegia and hemiparesis following cerebral infarction affecting right dominant side: Secondary | ICD-10-CM | POA: Diagnosis not present

## 2016-06-21 DIAGNOSIS — I10 Essential (primary) hypertension: Secondary | ICD-10-CM | POA: Diagnosis not present

## 2016-06-22 DIAGNOSIS — E039 Hypothyroidism, unspecified: Secondary | ICD-10-CM | POA: Diagnosis not present

## 2016-06-22 DIAGNOSIS — I10 Essential (primary) hypertension: Secondary | ICD-10-CM | POA: Diagnosis not present

## 2016-06-22 DIAGNOSIS — G8929 Other chronic pain: Secondary | ICD-10-CM | POA: Diagnosis not present

## 2016-06-22 DIAGNOSIS — M549 Dorsalgia, unspecified: Secondary | ICD-10-CM | POA: Diagnosis not present

## 2016-06-22 DIAGNOSIS — F419 Anxiety disorder, unspecified: Secondary | ICD-10-CM | POA: Diagnosis not present

## 2016-06-22 DIAGNOSIS — I69351 Hemiplegia and hemiparesis following cerebral infarction affecting right dominant side: Secondary | ICD-10-CM | POA: Diagnosis not present

## 2016-06-23 DIAGNOSIS — G8929 Other chronic pain: Secondary | ICD-10-CM | POA: Diagnosis not present

## 2016-06-23 DIAGNOSIS — F419 Anxiety disorder, unspecified: Secondary | ICD-10-CM | POA: Diagnosis not present

## 2016-06-23 DIAGNOSIS — I69351 Hemiplegia and hemiparesis following cerebral infarction affecting right dominant side: Secondary | ICD-10-CM | POA: Diagnosis not present

## 2016-06-23 DIAGNOSIS — M549 Dorsalgia, unspecified: Secondary | ICD-10-CM | POA: Diagnosis not present

## 2016-06-23 DIAGNOSIS — E039 Hypothyroidism, unspecified: Secondary | ICD-10-CM | POA: Diagnosis not present

## 2016-06-23 DIAGNOSIS — I10 Essential (primary) hypertension: Secondary | ICD-10-CM | POA: Diagnosis not present

## 2016-06-26 DIAGNOSIS — M549 Dorsalgia, unspecified: Secondary | ICD-10-CM | POA: Diagnosis not present

## 2016-06-26 DIAGNOSIS — G8929 Other chronic pain: Secondary | ICD-10-CM | POA: Diagnosis not present

## 2016-06-26 DIAGNOSIS — I69351 Hemiplegia and hemiparesis following cerebral infarction affecting right dominant side: Secondary | ICD-10-CM | POA: Diagnosis not present

## 2016-06-26 DIAGNOSIS — F419 Anxiety disorder, unspecified: Secondary | ICD-10-CM | POA: Diagnosis not present

## 2016-06-26 DIAGNOSIS — E039 Hypothyroidism, unspecified: Secondary | ICD-10-CM | POA: Diagnosis not present

## 2016-06-26 DIAGNOSIS — I10 Essential (primary) hypertension: Secondary | ICD-10-CM | POA: Diagnosis not present

## 2016-06-27 ENCOUNTER — Encounter: Payer: Self-pay | Admitting: Internal Medicine

## 2016-06-27 DIAGNOSIS — I69351 Hemiplegia and hemiparesis following cerebral infarction affecting right dominant side: Secondary | ICD-10-CM | POA: Diagnosis not present

## 2016-06-27 DIAGNOSIS — F419 Anxiety disorder, unspecified: Secondary | ICD-10-CM | POA: Diagnosis not present

## 2016-06-27 DIAGNOSIS — G8929 Other chronic pain: Secondary | ICD-10-CM | POA: Diagnosis not present

## 2016-06-27 DIAGNOSIS — M549 Dorsalgia, unspecified: Secondary | ICD-10-CM | POA: Diagnosis not present

## 2016-06-27 DIAGNOSIS — I10 Essential (primary) hypertension: Secondary | ICD-10-CM | POA: Diagnosis not present

## 2016-06-27 DIAGNOSIS — E039 Hypothyroidism, unspecified: Secondary | ICD-10-CM | POA: Diagnosis not present

## 2016-06-27 NOTE — Progress Notes (Signed)
Location:  [facility] Room Number: 110P Place of Service:  SNF (31)  Patricia Delaine. Sheppard Coil, MD  Patient Care Team: Pcp Not In System as PCP - General  Extended Emergency Contact Information Primary Emergency Contact: Kamphausen,Kate Address: [place], [place] Phone: [phone] Work Phone: [phone] Relation: Daughter Secondary Emergency Contact: Townsend,Stephen  [place] Phone: [phone] Relation: Other    Allergies: Aciphex [rabeprazole sodium]; Aleve [naproxen sodium]; Atarax [hydroxyzine]; Belsomra [suvorexant]; Buspirone; Codeine; Erythromycin; Keflex [cephalexin]; Lidocaine; Prednisone; Tramadol; and Zoloft [sertraline hcl]  Chief Complaint  Patient presents with  . Acute Visit    HPI: Patient is 76 y.o. female who is being seen acutely because an outbreak of Influenza A per CDC guidelines was recognized on 05/17/2016. Pt has no c/o flu like symptoms;therefore pt will need to be prophylaxed with Tamiflu for a minimum of 14 days per CDC protocol.  Past Medical History:  Diagnosis Date  . Cataract   . Fibroma 2006  . Hypertension   . Thyroid disease     Past Surgical History:  Procedure Laterality Date  . EYE SURGERY    . fibroma      Allergies as of 05/17/2016      Reactions   Aciphex [rabeprazole Sodium]    Aleve [naproxen Sodium]    Atarax [hydroxyzine]    Belsomra [suvorexant] Other (See Comments)   Nightmares   Buspirone    Codeine    Erythromycin    Keflex [cephalexin]    Lidocaine    Prednisone    Insomnia, nausea, hot flush   Tramadol    Zoloft [sertraline Hcl]       Medication List       Accurate as of 05/17/16 11:59 PM. Always use your most recent med list.          acetaminophen 500 MG tablet Commonly known as:  TYLENOL Take 1,000 mg by mouth every 8 (eight) hours as needed for moderate pain (pain).   amLODipine 10 MG  tablet Commonly known as:  NORVASC Take 1 tablet (10 mg total) by mouth daily.   bisoprolol 10 MG tablet Commonly known as:  ZEBETA Take 1 tablet (10 mg total) by mouth daily.   calcium citrate 950 MG tablet Commonly known as:  CALCITRATE - dosed in mg elemental calcium Take 200 mg of elemental calcium by mouth daily.   citalopram 10 MG tablet Commonly known as:  CELEXA Take 1 tablet (10 mg total) by mouth daily.   clonazePAM 0.5 MG tablet Commonly known as:  KLONOPIN Take 0.5 mg by mouth at bedtime.   Diphenhydramine-PE-APAP 12.5-5-325 MG Tabs Take 12.5 mg by mouth See admin instructions. 1 tablet 12.5 mg at 8 am and 1 tablet (12.5) at 2 pm. Then take 2 tablets (25 mg) at bedtime.   Melatonin 5 MG Tabs Take 3 tablets by mouth at bedtime. 8 pm   methocarbamol 500 MG tablet Commonly known as:  ROBAXIN Take 500 mg by mouth every 8 (eight) hours as needed for muscle spasms.   polyethylene glycol packet Commonly known as:  MIRALAX / GLYCOLAX Take 17 g by mouth daily.   potassium chloride 20 MEQ packet Commonly known as:  KLOR-CON Take 20 mEq by mouth 2 (two) times daily.   QUINOA KALE & HEMP Liqd Take 1 drop by mouth daily after supper. Under tongue   senna-docusate 8.6-50 MG  tablet Commonly known as:  Senokot-S Take 2 tablets by mouth 2 (two) times daily.   thyroid 90 MG tablet Commonly known as:  ARMOUR Take 1 tablet (90 mg total) by mouth daily before breakfast.   traMADol 50 MG tablet Commonly known as:  ULTRAM Take 50 mg by mouth every 12 (twelve) hours. scheduled   traMADol 50 MG tablet Commonly known as:  ULTRAM Take 50 mg by mouth every 12 (twelve) hours as needed for moderate pain.   vitamin C 500 MG tablet Commonly known as:  ASCORBIC ACID Take 1,000 mg by mouth daily.   Vitamin D3 5000 units Caps Take 1 capsule by mouth daily.   vitamin E 100 UNIT capsule Take 100 Units by mouth daily.       No orders of the defined types were placed in  this encounter.   Immunization History  Administered Date(s) Administered  . PPD Test 05/01/2016    Social History  Substance Use Topics  . Smoking status: Never Smoker  . Smokeless tobacco: Never Used  . Alcohol use No    Review of Systems  DATA OBTAINED: from patient, nurse GENERAL:  no fevers SKIN: No itching, rash HEENT: no rhinorrhea, congestion, ST or ear pain RESPIRATORY: No cough, wheezing, SOB CARDIAC: No chest pain, palpitations, lower extremity edema  GI: No abdominal pain, No N/V/D or constipation, No heartburn or reflux  MUSCULOSKELETAL: No muscle aches NEUROLOGIC: No headache, dizziness   Vitals:   05/17/16 1531  BP: (!) 104/59  Pulse: 76  Resp: 18  Temp: 97.6 F (36.4 C)   Body mass index is 21.58 kg/m. Physical Exam  GENERAL APPEARANCE: Alert, conversant, No acute distress  SKIN: No diaphoresis rash HEENT: Unremarkable RESPIRATORY: Breathing is even, unlabored. Lung sounds are clear   CARDIOVASCULAR: Heart RRR no murmurs, rubs or gallops. No peripheral edema  GASTROINTESTINAL: Abdomen is soft, non-tender, not distended w/ normal bowel sounds.   NEUROLOGIC: Cranial nerves 2-12 grossly intact PSYCHIATRIC: baseline, no mental status changes  Patient Active Problem List   Diagnosis Date Noted  . SIADH (syndrome of inappropriate ADH production) ([facility]) 06/12/2016  . Enterococcus UTI 05/22/2016  . Vitamin D deficiency 05/02/2016  . Hypertensive emergency 05/01/2016  . Anxiety state 05/01/2016  . Chest pain 05/01/2016  . 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 Hypothyroidism 07/30/2014  . Hyponatremia 07/30/2014  . Hypokalemia 07/30/2014  . Hypochloremia 07/30/2014  . Nausea & vomiting 07/30/2014  . UTI (lower urinary tract infection) 07/30/2014  . Chronic back pain 07/30/2014  . Insomnia 07/30/2014  . Hypertension   . Sciatic neuritis 06/30/2014    CMP     Component Value Date/Time   NA 134 (A)  06/07/2016   K 4.4 06/07/2016   CL 94 (L) 05/01/2016 0443   CO2 25 05/01/2016 0443   GLUCOSE 114 (H) 05/01/2016 0443   BUN 22 (A) 06/07/2016   CREATININE 0.6 06/07/2016   CREATININE 0.64 05/01/2016 0443   CALCIUM 9.4 05/01/2016 0443   PROT 7.0 07/30/2014 1646   ALBUMIN 3.5 07/30/2014 1646   AST 36 07/30/2014 1646   ALT 21 07/30/2014 1646   ALKPHOS 55 07/30/2014 1646   BILITOT 1.0 07/30/2014 1646   GFRNONAA >60 05/01/2016 0443   GFRAA >60 05/01/2016 0443    Recent Labs  04/26/16 2052  04/28/16 0336 04/29/16 0254 04/30/16 0449  05/01/16 0443 05/05/16 06/07/16  NA  --   < >  --  127* 126*  < >  129* 128* 134*  K  --   < >  --  3.6 3.2*  --  3.5 3.9 4.4  CL  --   < >  --  96* 92*  --  94*  --   --   CO2  --   < >  --  21* 23  --  25  --   --   GLUCOSE  --   < >  --  106* 121*  --  114*  --   --   BUN  --   < >  --  23* 17  < > 18 19 22*  CREATININE  --   < >  --  0.86 0.75  < > 0.64 0.6 0.6  CALCIUM  --   < >  --  9.2 9.4  --  9.4  --   --   MG 2.0  --  1.9  --   --   --   --   --   --   PHOS 2.4*  --  2.7  --   --   --   --   --   --   < > = values in this interval not displayed. No results for input(s): AST, ALT, ALKPHOS, BILITOT, PROT, ALBUMIN in the last 8760 hours.  Recent Labs  04/29/16 0254 04/30/16 0449  05/01/16 0443 05/05/16 06/07/16  WBC 10.9* 10.4  < > 10.7* 9.7 7.3  HGB 12.9 14.3  --  13.5 13.9 13.0  HCT 37.3 39.7  --  38.2 40 39  MCV 88.2 88.2  --  88.6  --   --   PLT 327 306  --  292 352 265  < > = values in this interval not displayed.  Recent Labs  04/28/16 0336  CHOL 205*  LDLCALC 131*  TRIG 72   No results found for: [facility] Lab Results  Component Value Date   TSH 5.293 (H) 04/26/2016   Lab Results  Component Value Date   HGBA1C 5.7 (H) 04/28/2016   Lab Results  Component Value Date   CHOL 205 (H) 04/28/2016   HDL 60 04/28/2016   LDLCALC 131 (H) 04/28/2016   TRIG 72 04/28/2016   CHOLHDL 3.4 04/28/2016    Significant  Diagnostic Results in last 30 days:  No results found.  Assessment and Plan  EXPOSURE TO FLU/ INFLUENZA OUTBREAK AT SNF-   CrCl calculated by me-  61.8      Dose for 14 days- 75 mg daily Pt will be monitored daily for flu like symptoms                                                                                 Webb Silversmith D. Sheppard Coil, MD

## 2016-06-28 DIAGNOSIS — F419 Anxiety disorder, unspecified: Secondary | ICD-10-CM | POA: Diagnosis not present

## 2016-06-28 DIAGNOSIS — I69351 Hemiplegia and hemiparesis following cerebral infarction affecting right dominant side: Secondary | ICD-10-CM | POA: Diagnosis not present

## 2016-06-28 DIAGNOSIS — I10 Essential (primary) hypertension: Secondary | ICD-10-CM | POA: Diagnosis not present

## 2016-06-28 DIAGNOSIS — E039 Hypothyroidism, unspecified: Secondary | ICD-10-CM | POA: Diagnosis not present

## 2016-06-28 DIAGNOSIS — M549 Dorsalgia, unspecified: Secondary | ICD-10-CM | POA: Diagnosis not present

## 2016-06-28 DIAGNOSIS — G8929 Other chronic pain: Secondary | ICD-10-CM | POA: Diagnosis not present

## 2016-06-29 DIAGNOSIS — M545 Low back pain: Secondary | ICD-10-CM | POA: Diagnosis not present

## 2016-06-29 DIAGNOSIS — G894 Chronic pain syndrome: Secondary | ICD-10-CM | POA: Diagnosis not present

## 2016-06-29 DIAGNOSIS — M419 Scoliosis, unspecified: Secondary | ICD-10-CM | POA: Diagnosis not present

## 2016-06-30 DIAGNOSIS — G8929 Other chronic pain: Secondary | ICD-10-CM | POA: Diagnosis not present

## 2016-06-30 DIAGNOSIS — I10 Essential (primary) hypertension: Secondary | ICD-10-CM | POA: Diagnosis not present

## 2016-06-30 DIAGNOSIS — E039 Hypothyroidism, unspecified: Secondary | ICD-10-CM | POA: Diagnosis not present

## 2016-06-30 DIAGNOSIS — F419 Anxiety disorder, unspecified: Secondary | ICD-10-CM | POA: Diagnosis not present

## 2016-06-30 DIAGNOSIS — M549 Dorsalgia, unspecified: Secondary | ICD-10-CM | POA: Diagnosis not present

## 2016-06-30 DIAGNOSIS — I69351 Hemiplegia and hemiparesis following cerebral infarction affecting right dominant side: Secondary | ICD-10-CM | POA: Diagnosis not present

## 2016-07-03 DIAGNOSIS — I10 Essential (primary) hypertension: Secondary | ICD-10-CM | POA: Diagnosis not present

## 2016-07-03 DIAGNOSIS — G8929 Other chronic pain: Secondary | ICD-10-CM | POA: Diagnosis not present

## 2016-07-03 DIAGNOSIS — M549 Dorsalgia, unspecified: Secondary | ICD-10-CM | POA: Diagnosis not present

## 2016-07-03 DIAGNOSIS — I69351 Hemiplegia and hemiparesis following cerebral infarction affecting right dominant side: Secondary | ICD-10-CM | POA: Diagnosis not present

## 2016-07-03 DIAGNOSIS — E039 Hypothyroidism, unspecified: Secondary | ICD-10-CM | POA: Diagnosis not present

## 2016-07-03 DIAGNOSIS — F419 Anxiety disorder, unspecified: Secondary | ICD-10-CM | POA: Diagnosis not present

## 2016-07-04 DIAGNOSIS — M549 Dorsalgia, unspecified: Secondary | ICD-10-CM | POA: Diagnosis not present

## 2016-07-04 DIAGNOSIS — G8929 Other chronic pain: Secondary | ICD-10-CM | POA: Diagnosis not present

## 2016-07-04 DIAGNOSIS — F419 Anxiety disorder, unspecified: Secondary | ICD-10-CM | POA: Diagnosis not present

## 2016-07-04 DIAGNOSIS — I10 Essential (primary) hypertension: Secondary | ICD-10-CM | POA: Diagnosis not present

## 2016-07-04 DIAGNOSIS — E039 Hypothyroidism, unspecified: Secondary | ICD-10-CM | POA: Diagnosis not present

## 2016-07-04 DIAGNOSIS — I69351 Hemiplegia and hemiparesis following cerebral infarction affecting right dominant side: Secondary | ICD-10-CM | POA: Diagnosis not present

## 2016-07-05 DIAGNOSIS — I69351 Hemiplegia and hemiparesis following cerebral infarction affecting right dominant side: Secondary | ICD-10-CM | POA: Diagnosis not present

## 2016-07-05 DIAGNOSIS — F419 Anxiety disorder, unspecified: Secondary | ICD-10-CM | POA: Diagnosis not present

## 2016-07-05 DIAGNOSIS — G8929 Other chronic pain: Secondary | ICD-10-CM | POA: Diagnosis not present

## 2016-07-05 DIAGNOSIS — I10 Essential (primary) hypertension: Secondary | ICD-10-CM | POA: Diagnosis not present

## 2016-07-05 DIAGNOSIS — M549 Dorsalgia, unspecified: Secondary | ICD-10-CM | POA: Diagnosis not present

## 2016-07-05 DIAGNOSIS — E039 Hypothyroidism, unspecified: Secondary | ICD-10-CM | POA: Diagnosis not present

## 2016-07-06 DIAGNOSIS — E039 Hypothyroidism, unspecified: Secondary | ICD-10-CM | POA: Diagnosis not present

## 2016-07-06 DIAGNOSIS — I10 Essential (primary) hypertension: Secondary | ICD-10-CM | POA: Diagnosis not present

## 2016-07-08 ENCOUNTER — Encounter: Payer: Self-pay | Admitting: Internal Medicine

## 2016-07-09 ENCOUNTER — Other Ambulatory Visit: Payer: Self-pay | Admitting: Internal Medicine

## 2016-07-10 ENCOUNTER — Encounter (INDEPENDENT_AMBULATORY_CARE_PROVIDER_SITE_OTHER): Payer: Self-pay

## 2016-07-10 ENCOUNTER — Ambulatory Visit (INDEPENDENT_AMBULATORY_CARE_PROVIDER_SITE_OTHER): Payer: Medicare Other | Admitting: Neurology

## 2016-07-10 ENCOUNTER — Encounter: Payer: Self-pay | Admitting: Neurology

## 2016-07-10 VITALS — BP 109/70 | HR 80 | Wt 113.2 lb

## 2016-07-10 DIAGNOSIS — I639 Cerebral infarction, unspecified: Secondary | ICD-10-CM | POA: Diagnosis not present

## 2016-07-10 DIAGNOSIS — I69351 Hemiplegia and hemiparesis following cerebral infarction affecting right dominant side: Secondary | ICD-10-CM | POA: Diagnosis not present

## 2016-07-10 DIAGNOSIS — I610 Nontraumatic intracerebral hemorrhage in hemisphere, subcortical: Secondary | ICD-10-CM

## 2016-07-10 DIAGNOSIS — E039 Hypothyroidism, unspecified: Secondary | ICD-10-CM | POA: Diagnosis not present

## 2016-07-10 DIAGNOSIS — F419 Anxiety disorder, unspecified: Secondary | ICD-10-CM | POA: Diagnosis not present

## 2016-07-10 DIAGNOSIS — M549 Dorsalgia, unspecified: Secondary | ICD-10-CM | POA: Diagnosis not present

## 2016-07-10 DIAGNOSIS — G8929 Other chronic pain: Secondary | ICD-10-CM | POA: Diagnosis not present

## 2016-07-10 DIAGNOSIS — I10 Essential (primary) hypertension: Secondary | ICD-10-CM | POA: Diagnosis not present

## 2016-07-10 NOTE — Patient Instructions (Addendum)
I had a long d/w patient and her daughter about her recent intracerebral hemorrhage, risk for recurrent stroke/TIAs, personally independently reviewed imaging studies and stroke evaluation results and answered questions.   Maintain strict control of hypertension with blood pressure goal below 130/90, diabetes with hemoglobin A1c goal below 6.5% and lipids with LDL cholesterol goal below 70 mg/dL. I also advised the patient to eat a healthy diet with plenty of whole grains, cereals, fruits and vegetables, exercise regularly and maintain ideal body weight. She was advised to use a cane at all times while walking and recommend outpatient physical and occupational therapy as well. I recommend she discuss her insomnia and anxiety medications with her primary physician or Dr. Inocencio Homes from geriatric medicine Followup in the future with my nurse practitioner in 6 months or call earlier if necessary.

## 2016-07-10 NOTE — Progress Notes (Signed)
[facility] [address] [place]. [place] [address] [phone]       OFFICE FOLLOW-UP NOTE  [name] Date of Birth:  Oct 23, 1940 Medical Record Number:  [id number]   HPI: Patricia Beck is a 76 year old pleasant Caucasian lady who is seen today for the first office follow-up visit following hospital admission for intracerebral hemorrhage in January 2018. She is accompanied by her daughter today who is visiting from [place]. History is obtained from the patient and review of Hospital medical records. I have personally viewed imaging studies.Patricia Beck an 76 y.o.femalewho initially presented to [facility] with chief complaints of increasing back pain and anxiety. On initial evaluation by hospitalist team her chest pain was felt likely to be secondary to anxiety as her troponin was negative and her EKG was unchanged from previous. She was also noted to be hyponatremic with Na of 123, thought to be due to SIADH versus polydipsia; she has been admitted with low sodium in the past; at [facility] this admission she was placed on fluid restriction and plan was to monitor BMP closely. She denied any SOB, diaphoresis, abdominal pain, vomiting, headache, dizziness or nausea on initial evaluation at [facility] on Wednesday 1/11. She was noted to have significantly elevate BP with SBP readings of 156-198 and DBP ranging between 82 and 119. She was admitted to the stepdown unit with PRN antihypertensives and IVF. While ambulating to her bedside commode, acute onset of right facial droop and RUE weakness was noted. A Code Stroke was called and she was sent for emergent CT, which showed a medium-sized acute left basal ganglia and internal capsule hemorrhage. She was transferred to the [facility] ICU for further management. She has been transferred to the Neurology service from the Hospitalist service with the ICU physician team consulting. Patient remained stable during hospital stay and blood pressure was  tightly controlled. MRI scan of the brain confirmed acute left basal ganglia parenchymal hemorrhage without any underlying lesion. MRA of the brain showed no high-grade stenosis, aneurysm or vascular malformation. Carotid ultrasound was unremarkable. Transthoracic echo showed normal ejection fraction. LDL cholesterol elevated at 131 mg percent and hemoglobin A1c was 5.7. Patient was rehabilitation in a skilled nursing facility at [facility]. She's done well since then and has now recently moved to [place]. She is able to ambulate by herself and uses a cane which is mostly for her low back pain. She's had no recent falls or injuries. Her blood pressure is now well controlled after medications were increased and today it is 100/70. She still has some mild weakness of the right hand but overall has obtained significant improvement.    ROS:   14 system review of systems is positive for  activity change, fatigue, memory loss, frequent waking, walking difficulty, back pain, depression, anxiety, speech difficulty, frequent infections and all the systems negative  PMH:  Past Medical History:  Diagnosis Date  . Cataract   . Fibroma 2006  . Hypertension   . Thyroid disease     Social History:  Social History   Social History  . Marital status: Widowed    Spouse name: N/A  . Number of children: N/A  . Years of education: N/A   Occupational History  . Not on file.   Social History Main Topics  . Smoking status: Never Smoker  . Smokeless tobacco: Never Used  . Alcohol use No  . Drug use: No  . Sexual activity: Not on file   Other  Topics Concern  . Not on file   Social History Narrative  . No narrative on file    Medications:   Current Outpatient Prescriptions on File Prior to Visit  Medication Sig Dispense Refill  . acetaminophen (TYLENOL) 500 MG tablet Take 1,000 mg by mouth every 8 (eight) hours as needed for moderate pain (pain).     Marland Kitchen amLODipine (NORVASC) 10 MG  tablet Take 1 tablet (10 mg total) by mouth daily. 30 tablet 2  . bisoprolol (ZEBETA) 10 MG tablet Take 1 tablet (10 mg total) by mouth daily. 30 tablet 2  . calcium citrate (CALCITRATE - DOSED IN MG ELEMENTAL CALCIUM) 950 MG tablet Take 200 mg of elemental calcium by mouth daily.    . Cholecalciferol (VITAMIN D3) 5000 units CAPS Take 1 capsule by mouth daily.    . citalopram (CELEXA) 10 MG tablet Take 1 tablet (10 mg total) by mouth daily. 30 tablet 2  . clonazePAM (KLONOPIN) 0.5 MG tablet Take 0.5 mg by mouth at bedtime.     . Diphenhydramine-PE-APAP 12.5-5-325 MG TABS Take 12.5 mg by mouth See admin instructions. 1 tablet 12.5 mg at 8 am and 1 tablet (12.5) at 2 pm. Then take 2 tablets (25 mg) at bedtime.    . Melatonin 5 MG TABS Take 3 tablets by mouth at bedtime. 8 pm    . polyethylene glycol (MIRALAX / GLYCOLAX) packet Take 17 g by mouth daily. 14 each 0  . potassium chloride (KLOR-CON) 20 MEQ packet Take 20 mEq by mouth 2 (two) times daily. 3 packet 0  . thyroid (ARMOUR) 90 MG tablet Take 1 tablet (90 mg total) by mouth daily before breakfast. 30 tablet 2  . vitamin C (ASCORBIC ACID) 500 MG tablet Take 1,000 mg by mouth daily.    . vitamin E 100 UNIT capsule Take 100 Units by mouth daily.      No current facility-administered medications on file prior to visit.     Allergies:   Allergies  Allergen Reactions  . Aciphex [Rabeprazole Sodium]   . Aleve [Naproxen Sodium]   . Atarax [Hydroxyzine]   . Belsomra [Suvorexant] Other (See Comments)    Nightmares  . Buspirone     Anxious   . Codeine   . Erythromycin   . Flexeril [Cyclobenzaprine]     Muscle pain  . Keflex [Cephalexin]   . Lexapro [Escitalopram Oxalate]     anxious  . Lidocaine   . Lipitor [Atorvastatin]     Muscle pain  . Novocain [Procaine]     Fast heart beat  . Prednisone     Insomnia, nausea, hot flush   . Tramadol   . Zoloft [Sertraline Hcl]     Physical Exam General: well developed, well nourished  Elderly frail Caucasian lady, seated, in no evident distress Head: head normocephalic and atraumatic.  Neck: supple with no carotid or supraclavicular bruits Cardiovascular: regular rate and rhythm, no murmurs Musculoskeletal: no deformity Skin:  no rash/petichiae Vascular:  Normal pulses all extremities Vitals:   07/10/16 1315  BP: 109/70  Pulse: 80   Neurologic Exam Mental Status: Awake and fully alert. Oriented to place and time. Recent and remote memory intact. Attention span, concentration and fund of knowledge appropriate. Mood and affect appropriate. Recall 3/3. Able to name 13 animals with four legs. Cranial Nerves: Fundoscopic exam reveals sharp disc margins. Pupils equal, briskly reactive to light. Extraocular movements full without nystagmus. Visual fields full to confrontation. Hearing intact. Mild right lower facial  weakness. Facial sensation intact. , tongue, palate moves normally and symmetrically.  Motor: Normal bulk and tone. Normal strength in all tested extremity muscles. Mild weakness of right grip and intrinsic hand muscles. Orbits left over right upper extremity. Sensory.: intact to touch ,pinprick .position and vibratory sensation.  Coordination: Rapid alternating movements normal in all extremities. Finger-to-nose and heel-to-shin performed accurately bilaterally. Gait and Station: Arises from chair without difficulty. Stance is normal. Uses at the pain for ambulation Gait demonstrates normal stride length and balance . Able to heel, toe and tandem walk without difficulty.  Reflexes: 1+ and symmetric. Toes downgoing.   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   Maintain strict control of hypertension with blood pressure goal below 130/90, diabetes with hemoglobin A1c goal below 6.5% and lipids with LDL cholesterol goal below 70 mg/dL. I also advised the patient to eat a healthy diet with plenty of whole grains, cereals, fruits and vegetables, exercise regularly and maintain ideal body weight. She was advised to use a cane at all times while walking and recommend outpatient physical and occupational therapy as well. I recommend she discuss her insomnia and anxiety medications with her primary physician or Dr. Inocencio Homes from geriatric medicine Followup in the future with my nurse practitioner in 6 months or call earlier if necessary. Greater than 50% of time during this 25 minute visit was spent on counseling,explanation of diagnosis, planning of further management, discussion with patient and family and coordination of care Antony Contras, MD  [facility] [address] [place], [place] [address]  Phone [phone] Fax [phone] Note: This document was prepared with digital dictation and possible smart phrase technology. Any transcriptional errors that result from this process are unintentional

## 2016-07-12 DIAGNOSIS — E039 Hypothyroidism, unspecified: Secondary | ICD-10-CM | POA: Diagnosis not present

## 2016-07-18 DIAGNOSIS — R600 Localized edema: Secondary | ICD-10-CM | POA: Diagnosis not present

## 2016-07-18 DIAGNOSIS — I10 Essential (primary) hypertension: Secondary | ICD-10-CM | POA: Diagnosis not present

## 2016-07-18 DIAGNOSIS — I638 Other cerebral infarction: Secondary | ICD-10-CM | POA: Diagnosis not present

## 2016-07-18 DIAGNOSIS — E039 Hypothyroidism, unspecified: Secondary | ICD-10-CM | POA: Diagnosis not present

## 2016-07-19 DIAGNOSIS — N8189 Other female genital prolapse: Secondary | ICD-10-CM | POA: Diagnosis not present

## 2016-07-20 DIAGNOSIS — I69351 Hemiplegia and hemiparesis following cerebral infarction affecting right dominant side: Secondary | ICD-10-CM | POA: Diagnosis not present

## 2016-07-20 DIAGNOSIS — M549 Dorsalgia, unspecified: Secondary | ICD-10-CM | POA: Diagnosis not present

## 2016-07-20 DIAGNOSIS — G8929 Other chronic pain: Secondary | ICD-10-CM | POA: Diagnosis not present

## 2016-07-20 DIAGNOSIS — I10 Essential (primary) hypertension: Secondary | ICD-10-CM | POA: Diagnosis not present

## 2016-07-24 DIAGNOSIS — B373 Candidiasis of vulva and vagina: Secondary | ICD-10-CM | POA: Diagnosis not present

## 2016-07-24 DIAGNOSIS — N8189 Other female genital prolapse: Secondary | ICD-10-CM | POA: Diagnosis not present

## 2016-07-25 DIAGNOSIS — E039 Hypothyroidism, unspecified: Secondary | ICD-10-CM | POA: Diagnosis not present

## 2016-07-25 DIAGNOSIS — I10 Essential (primary) hypertension: Secondary | ICD-10-CM | POA: Diagnosis not present

## 2016-07-25 DIAGNOSIS — I69351 Hemiplegia and hemiparesis following cerebral infarction affecting right dominant side: Secondary | ICD-10-CM | POA: Diagnosis not present

## 2016-07-25 DIAGNOSIS — G8929 Other chronic pain: Secondary | ICD-10-CM | POA: Diagnosis not present

## 2016-07-25 DIAGNOSIS — F419 Anxiety disorder, unspecified: Secondary | ICD-10-CM | POA: Diagnosis not present

## 2016-07-25 DIAGNOSIS — M549 Dorsalgia, unspecified: Secondary | ICD-10-CM | POA: Diagnosis not present

## 2016-07-26 DIAGNOSIS — I69351 Hemiplegia and hemiparesis following cerebral infarction affecting right dominant side: Secondary | ICD-10-CM | POA: Diagnosis not present

## 2016-07-26 DIAGNOSIS — M549 Dorsalgia, unspecified: Secondary | ICD-10-CM | POA: Diagnosis not present

## 2016-07-26 DIAGNOSIS — F419 Anxiety disorder, unspecified: Secondary | ICD-10-CM | POA: Diagnosis not present

## 2016-07-26 DIAGNOSIS — I10 Essential (primary) hypertension: Secondary | ICD-10-CM | POA: Diagnosis not present

## 2016-07-26 DIAGNOSIS — E039 Hypothyroidism, unspecified: Secondary | ICD-10-CM | POA: Diagnosis not present

## 2016-07-26 DIAGNOSIS — G8929 Other chronic pain: Secondary | ICD-10-CM | POA: Diagnosis not present

## 2016-07-27 DIAGNOSIS — I10 Essential (primary) hypertension: Secondary | ICD-10-CM | POA: Diagnosis not present

## 2016-07-27 DIAGNOSIS — F419 Anxiety disorder, unspecified: Secondary | ICD-10-CM | POA: Diagnosis not present

## 2016-07-27 DIAGNOSIS — G8929 Other chronic pain: Secondary | ICD-10-CM | POA: Diagnosis not present

## 2016-07-27 DIAGNOSIS — M549 Dorsalgia, unspecified: Secondary | ICD-10-CM | POA: Diagnosis not present

## 2016-07-27 DIAGNOSIS — I69351 Hemiplegia and hemiparesis following cerebral infarction affecting right dominant side: Secondary | ICD-10-CM | POA: Diagnosis not present

## 2016-07-27 DIAGNOSIS — E039 Hypothyroidism, unspecified: Secondary | ICD-10-CM | POA: Diagnosis not present

## 2016-07-31 DIAGNOSIS — G8929 Other chronic pain: Secondary | ICD-10-CM | POA: Diagnosis not present

## 2016-07-31 DIAGNOSIS — E039 Hypothyroidism, unspecified: Secondary | ICD-10-CM | POA: Diagnosis not present

## 2016-07-31 DIAGNOSIS — M549 Dorsalgia, unspecified: Secondary | ICD-10-CM | POA: Diagnosis not present

## 2016-07-31 DIAGNOSIS — F419 Anxiety disorder, unspecified: Secondary | ICD-10-CM | POA: Diagnosis not present

## 2016-07-31 DIAGNOSIS — I10 Essential (primary) hypertension: Secondary | ICD-10-CM | POA: Diagnosis not present

## 2016-07-31 DIAGNOSIS — I69351 Hemiplegia and hemiparesis following cerebral infarction affecting right dominant side: Secondary | ICD-10-CM | POA: Diagnosis not present

## 2016-08-03 DIAGNOSIS — G8929 Other chronic pain: Secondary | ICD-10-CM | POA: Diagnosis not present

## 2016-08-03 DIAGNOSIS — E039 Hypothyroidism, unspecified: Secondary | ICD-10-CM | POA: Diagnosis not present

## 2016-08-03 DIAGNOSIS — I69351 Hemiplegia and hemiparesis following cerebral infarction affecting right dominant side: Secondary | ICD-10-CM | POA: Diagnosis not present

## 2016-08-03 DIAGNOSIS — M549 Dorsalgia, unspecified: Secondary | ICD-10-CM | POA: Diagnosis not present

## 2016-08-03 DIAGNOSIS — I10 Essential (primary) hypertension: Secondary | ICD-10-CM | POA: Diagnosis not present

## 2016-08-03 DIAGNOSIS — F419 Anxiety disorder, unspecified: Secondary | ICD-10-CM | POA: Diagnosis not present

## 2016-08-04 DIAGNOSIS — G8929 Other chronic pain: Secondary | ICD-10-CM | POA: Diagnosis not present

## 2016-08-04 DIAGNOSIS — I69351 Hemiplegia and hemiparesis following cerebral infarction affecting right dominant side: Secondary | ICD-10-CM | POA: Diagnosis not present

## 2016-08-04 DIAGNOSIS — E039 Hypothyroidism, unspecified: Secondary | ICD-10-CM | POA: Diagnosis not present

## 2016-08-04 DIAGNOSIS — F419 Anxiety disorder, unspecified: Secondary | ICD-10-CM | POA: Diagnosis not present

## 2016-08-04 DIAGNOSIS — I10 Essential (primary) hypertension: Secondary | ICD-10-CM | POA: Diagnosis not present

## 2016-08-04 DIAGNOSIS — M549 Dorsalgia, unspecified: Secondary | ICD-10-CM | POA: Diagnosis not present

## 2016-08-07 DIAGNOSIS — G8929 Other chronic pain: Secondary | ICD-10-CM | POA: Diagnosis not present

## 2016-08-07 DIAGNOSIS — I69351 Hemiplegia and hemiparesis following cerebral infarction affecting right dominant side: Secondary | ICD-10-CM | POA: Diagnosis not present

## 2016-08-07 DIAGNOSIS — F419 Anxiety disorder, unspecified: Secondary | ICD-10-CM | POA: Diagnosis not present

## 2016-08-07 DIAGNOSIS — E039 Hypothyroidism, unspecified: Secondary | ICD-10-CM | POA: Diagnosis not present

## 2016-08-07 DIAGNOSIS — M549 Dorsalgia, unspecified: Secondary | ICD-10-CM | POA: Diagnosis not present

## 2016-08-07 DIAGNOSIS — I10 Essential (primary) hypertension: Secondary | ICD-10-CM | POA: Diagnosis not present

## 2016-08-08 DIAGNOSIS — I10 Essential (primary) hypertension: Secondary | ICD-10-CM | POA: Diagnosis not present

## 2016-08-08 DIAGNOSIS — M549 Dorsalgia, unspecified: Secondary | ICD-10-CM | POA: Diagnosis not present

## 2016-08-08 DIAGNOSIS — G8929 Other chronic pain: Secondary | ICD-10-CM | POA: Diagnosis not present

## 2016-08-08 DIAGNOSIS — R600 Localized edema: Secondary | ICD-10-CM | POA: Diagnosis not present

## 2016-08-08 DIAGNOSIS — E039 Hypothyroidism, unspecified: Secondary | ICD-10-CM | POA: Diagnosis not present

## 2016-08-08 DIAGNOSIS — R319 Hematuria, unspecified: Secondary | ICD-10-CM | POA: Diagnosis not present

## 2016-08-08 DIAGNOSIS — F419 Anxiety disorder, unspecified: Secondary | ICD-10-CM | POA: Diagnosis not present

## 2016-08-08 DIAGNOSIS — I69351 Hemiplegia and hemiparesis following cerebral infarction affecting right dominant side: Secondary | ICD-10-CM | POA: Diagnosis not present

## 2016-08-10 DIAGNOSIS — E039 Hypothyroidism, unspecified: Secondary | ICD-10-CM | POA: Diagnosis not present

## 2016-08-10 DIAGNOSIS — I10 Essential (primary) hypertension: Secondary | ICD-10-CM | POA: Diagnosis not present

## 2016-08-10 DIAGNOSIS — G8929 Other chronic pain: Secondary | ICD-10-CM | POA: Diagnosis not present

## 2016-08-10 DIAGNOSIS — M549 Dorsalgia, unspecified: Secondary | ICD-10-CM | POA: Diagnosis not present

## 2016-08-10 DIAGNOSIS — F419 Anxiety disorder, unspecified: Secondary | ICD-10-CM | POA: Diagnosis not present

## 2016-08-10 DIAGNOSIS — N760 Acute vaginitis: Secondary | ICD-10-CM | POA: Diagnosis not present

## 2016-08-10 DIAGNOSIS — I69351 Hemiplegia and hemiparesis following cerebral infarction affecting right dominant side: Secondary | ICD-10-CM | POA: Diagnosis not present

## 2016-08-10 DIAGNOSIS — N8189 Other female genital prolapse: Secondary | ICD-10-CM | POA: Diagnosis not present

## 2016-08-11 DIAGNOSIS — E039 Hypothyroidism, unspecified: Secondary | ICD-10-CM | POA: Diagnosis not present

## 2016-08-11 DIAGNOSIS — I10 Essential (primary) hypertension: Secondary | ICD-10-CM | POA: Diagnosis not present

## 2016-08-11 DIAGNOSIS — I69351 Hemiplegia and hemiparesis following cerebral infarction affecting right dominant side: Secondary | ICD-10-CM | POA: Diagnosis not present

## 2016-08-11 DIAGNOSIS — G8929 Other chronic pain: Secondary | ICD-10-CM | POA: Diagnosis not present

## 2016-08-11 DIAGNOSIS — M549 Dorsalgia, unspecified: Secondary | ICD-10-CM | POA: Diagnosis not present

## 2016-08-11 DIAGNOSIS — F419 Anxiety disorder, unspecified: Secondary | ICD-10-CM | POA: Diagnosis not present

## 2016-08-18 DIAGNOSIS — M6281 Muscle weakness (generalized): Secondary | ICD-10-CM | POA: Diagnosis not present

## 2016-08-18 DIAGNOSIS — R293 Abnormal posture: Secondary | ICD-10-CM | POA: Diagnosis not present

## 2016-08-18 DIAGNOSIS — I69351 Hemiplegia and hemiparesis following cerebral infarction affecting right dominant side: Secondary | ICD-10-CM | POA: Diagnosis not present

## 2016-08-18 DIAGNOSIS — R3981 Functional urinary incontinence: Secondary | ICD-10-CM | POA: Diagnosis not present

## 2016-08-18 DIAGNOSIS — R262 Difficulty in walking, not elsewhere classified: Secondary | ICD-10-CM | POA: Diagnosis not present

## 2016-08-18 DIAGNOSIS — M545 Low back pain: Secondary | ICD-10-CM | POA: Diagnosis not present

## 2016-08-21 ENCOUNTER — Ambulatory Visit (INDEPENDENT_AMBULATORY_CARE_PROVIDER_SITE_OTHER): Payer: Medicare Other | Admitting: Neurology

## 2016-08-21 ENCOUNTER — Encounter: Payer: Self-pay | Admitting: Neurology

## 2016-08-21 ENCOUNTER — Encounter (INDEPENDENT_AMBULATORY_CARE_PROVIDER_SITE_OTHER): Payer: Self-pay

## 2016-08-21 VITALS — BP 128/74 | HR 65 | Ht 62.0 in | Wt 116.0 lb

## 2016-08-21 DIAGNOSIS — R202 Paresthesia of skin: Secondary | ICD-10-CM

## 2016-08-21 DIAGNOSIS — I639 Cerebral infarction, unspecified: Secondary | ICD-10-CM | POA: Diagnosis not present

## 2016-08-21 NOTE — Progress Notes (Signed)
[facility] [address] [place]. [place] [address] [phone]       OFFICE FOLLOW-UP NOTE  [name] Date of Birth:  March 12, 1941 Medical Record Number:  [id number]   HPI: Patricia Beck is a 76 year old pleasant Caucasian lady who is seen today for the first office follow-up visit following hospital admission for intracerebral hemorrhage in January 2018. She is accompanied by her daughter today who is visiting from [place]. History is obtained from the patient and review of Hospital medical records. I have personally viewed imaging studies.Mckinnley Cottier [name] an 76 y.o.femalewho initially presented to [facility] with chief complaints of increasing back pain and anxiety. On initial evaluation by hospitalist team her chest pain was felt likely to be secondary to anxiety as her troponin was negative and her EKG was unchanged from previous. 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 She denied any SOB, diaphoresis, abdominal pain, vomiting, headache, dizziness or nausea on initial evaluation at [facility] on Wednesday 1/11. She was noted to have significantly elevate BP with SBP readings of 156-198 and DBP ranging between 82 and 119. She was admitted to the stepdown unit with PRN antihypertensives and IVF. While ambulating to her bedside commode, acute onset of right facial droop and RUE weakness was noted. A Code Stroke was called and she was sent for emergent CT, which showed a medium-sized acute left basal ganglia and internal capsule hemorrhage. She was transferred to the [facility] ICU for further management. She has been transferred to the Neurology service from the Hospitalist service with the ICU physician team consulting. Patient remained stable during hospital stay and blood pressure was  tightly controlled. MRI scan of the brain confirmed acute left basal ganglia parenchymal hemorrhage without any underlying lesion. MRA of the brain showed no high-grade stenosis, aneurysm or vascular malformation. Carotid ultrasound was unremarkable. Transthoracic echo showed normal ejection fraction. LDL cholesterol elevated at 131 mg percent and hemoglobin A1c was 5.7. Patient was rehabilitation in a skilled nursing facility at [facility]. She's done well since then and has now recently moved to [facility]. She is able to ambulate by herself and uses a cane which is mostly for her low back pain. She's had no recent falls or injuries. Her blood pressure is now well controlled after medications were increased and today it is 100/70. She still has some mild weakness of the right hand but overall has obtained significant improvement.  Update 08/21/2016 :  She returns for follow-up after last visit 6 weeks ago. She is referred back by her primary physician and she complained of increasing tingling numbness in hands and feet as well as worsening balance difficulties for the last 3 weeks. She describes this as intermittent tingling numbness involving all fingers most prominent in the right index finger. She is also noticed some diminished extremity and trouble opening cans and jars. She is also noticed some worsening balance and has had one fall. She also has some tingling in her feet but not to the same extent as the hands. She does have trouble sleeping and takes trazodone, Ambien, Benadryl and melatonin but plans to cut back. She has not lost any weight and feels appetite is good. She was seen by primary physician recently for leg swelling and she decreased  her dose of amlodipine which has helped. She does according anxiety and does take Celexa but feels anxiety is controlled. ROS:   14 system review of systems is positive for   drooling, activity change, cold intolerance, abdominal pain, diarrhea,  incontinence, insomnia, daytime sleepiness, leg swelling, tingling numbness, back pain, aching muscles, walking difficulty, neck stiffness, skin dryness, headache, confusion, decreased concentration and all other systems negative and all the systems negative  PMH:  Past Medical History:  Diagnosis Date  . Cataract   . Fibroma 2006  . Hypertension   . Stroke ([place])   . Thyroid disease     Social History:  Social History   Social History  . Marital status: Widowed    Spouse name: N/A  . Number of children: N/A  . Years of education: N/A   Occupational History  . Not on file.   Social History Main Topics  . Smoking status: Never Smoker  . Smokeless tobacco: Never Used  . Alcohol use No  . Drug use: No  . Sexual activity: Not on file   Other Topics Concern  . Not on file   Social History Narrative  . No narrative on file    Medications:   Current Outpatient Prescriptions on File Prior to Visit  Medication Sig Dispense Refill  . acetaminophen (TYLENOL) 500 MG tablet Take 1,000 mg by mouth every 8 (eight) hours as needed for moderate pain (pain).     Marland Kitchen amLODipine (NORVASC) 10 MG tablet Take 1 tablet (10 mg total) by mouth daily. 30 tablet 2  . bisoprolol (ZEBETA) 10 MG tablet Take 1 tablet (10 mg total) by mouth daily. 30 tablet 2  . calcium citrate (CALCITRATE - DOSED IN MG ELEMENTAL CALCIUM) 950 MG tablet Take 200 mg of elemental calcium by mouth daily.    . Cholecalciferol (VITAMIN D3) 5000 units CAPS Take 1 capsule by mouth daily.    . citalopram (CELEXA) 10 MG tablet Take 1 tablet (10 mg total) by mouth daily. 30 tablet 2  . Diphenhydramine-PE-APAP 12.5-5-325 MG TABS Take 12.5 mg by mouth See admin instructions. 1 tablet 12.5 mg at 8 am and 1 tablet (12.5) at 2 pm. Then take 2 tablets (25 mg) at bedtime.    . Melatonin 5 MG TABS Take 3 tablets by mouth at bedtime. 8 pm    . potassium chloride (KLOR-CON) 20 MEQ packet Take 20 mEq by mouth 2 (two) times daily. 3 packet 0    . Thyroid (NATURE-THROID PO) Take 65 mg by mouth.    Marland Kitchen UNABLE TO FIND Hemp oil, take a teaspoon in the mouth daily    . vitamin C (ASCORBIC ACID) 500 MG tablet Take 1,000 mg by mouth daily.    . vitamin E 100 UNIT capsule Take 100 Units by mouth daily.      No current facility-administered medications on file prior to visit.     Allergies:   Allergies  Allergen Reactions  . Aciphex [Rabeprazole Sodium]   . Aleve [Naproxen Sodium]   . Atarax [Hydroxyzine]   . Belsomra [Suvorexant] Other (See Comments)    Nightmares  . Buspirone     Anxious   . Codeine   . Erythromycin   . Flexeril [Cyclobenzaprine]     Muscle pain  . Keflex [Cephalexin]   . Lexapro [Escitalopram Oxalate]     anxious  . Lidocaine   . Lipitor [Atorvastatin]     Muscle pain  . Norvasc [Amlodipine Besylate]  Swelling in feet  . Novocain [Procaine]     Fast heart beat  . Prednisone     Insomnia, nausea, hot flush   . Tramadol   . Zoloft [Sertraline Hcl]     Physical Exam General: well developed, well nourished Elderly frail Caucasian lady, seated, in no evident distress Head: head normocephalic and atraumatic.  Neck: supple with no carotid or supraclavicular bruits Cardiovascular: regular rate and rhythm, no murmurs Musculoskeletal: no deformity Skin:  no rash/petichiae Vascular:  Normal pulses all extremities Vitals:   08/21/16 1529  BP: 128/74  Pulse: 65   Neurologic Exam Mental Status: Awake and fully alert. Oriented to place and time. Recent and remote memory intact. Attention span, concentration and fund of knowledge appropriate. Mood and affect appropriate. Recall 3/3. Able to name 13 animals with four legs. Cranial Nerves: Fundoscopic exam reveals sharp disc margins. Pupils equal, briskly reactive to light. Extraocular movements full without nystagmus. Visual fields full to confrontation. Hearing intact. Mild right lower facial weakness. Facial sensation intact. , tongue, palate moves  normally and symmetrically.  Motor: Normal bulk and tone. Normal strength in all tested extremity muscles. Mild weakness of right grip and intrinsic hand muscles. Orbits left over right upper extremity. Sensory.: intact to touch ,pinprick .position and Slightly diminished vibratory sensation over both toes only.  Coordination: Rapid alternating movements normal in all extremities. Finger-to-nose and heel-to-shin performed accurately bilaterally. Gait and Station: Arises from chair without difficulty. Stance is normal. Uses at the pain for ambulation Gait demonstrates normal stride length and balance . Able to heel, toe and tandem walk without difficulty.  Reflexes: 1+ and symmetric. Toes downgoing.   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 I recommend evaluation for peripheral neuropathy by checking neuropathy panel labs, EMG nerve conduction study. I also advised her fall and safety prevention precautions and to use a cane and walker at all times. She was also advised to continue her Celexa for anxiety and I counseled her to cut back her sleeping aid medications at night which may be worsening her balance. She will maintain strict control of hypertension with blood pressure goal below 130/90. She will return for follow-up in 3 months or call earlier if necessary Greater than 50% of time during this 30 minute visit was spent on counseling,explanation of diagnosis, paresthesias ,planning of further management, discussion with patient and family and coordination of care Antony Contras, MD  [facility] [address] [place], [place] [address]  Phone [phone] Fax [phone] Note: This document was prepared with digital dictation and possible smart phrase technology. Any transcriptional errors that result from this process are unintentional

## 2016-08-21 NOTE — Patient Instructions (Signed)
I had a long discussion with the patient and her friend regarding her new complaints of paresthesias in hands and feet and worsening gait and balance difficulties. I recommend evaluation for peripheral neuropathy by checking neuropathy panel labs, EMG nerve conduction study. I also advised her fall and safety prevention precautions and to use a cane and walker at all times. She was also advised to continue her Celexa for anxiety and I counseled her to cut back her sleeping aid medications at night which may be worsening her balance. She will maintain strict control of hypertension with blood pressure goal below 130/90. She will return for follow-up in 3 months or call earlier if necessary   Paresthesia Paresthesia is a burning or prickling feeling. This feeling can happen in any part of the body. It often happens in the hands, arms, legs, or feet. Usually, it is not painful. In most cases, the feeling goes away in a short time and is not a sign of a serious problem. Follow these instructions at home:  Avoid drinking alcohol.  Try massage or needle therapy (acupuncture) to help with your problems.  Keep all follow-up visits as told by your doctor. This is important. Contact a doctor if:  You keep on having episodes of paresthesia.  Your burning or prickling feeling gets worse when you walk.  You have pain or cramps.  You feel dizzy.  You have a rash. Get help right away if:  You feel weak.  You have trouble walking or moving.  You have problems speaking, understanding, or seeing.  You feel confused.  You cannot control when you pee (urinate) or poop (bowel movement).  You lose feeling (numbness) after an injury.  You pass out (faint). This information is not intended to replace advice given to you by your health care provider. Make sure you discuss any questions you have with your health care provider. Document Released: 03/16/2008 Document Revised: 09/09/2015 Document Reviewed:  03/30/2014 Elsevier Interactive Patient Education  2017 [employer].

## 2016-08-22 DIAGNOSIS — M545 Low back pain: Secondary | ICD-10-CM | POA: Diagnosis not present

## 2016-08-22 DIAGNOSIS — R262 Difficulty in walking, not elsewhere classified: Secondary | ICD-10-CM | POA: Diagnosis not present

## 2016-08-22 DIAGNOSIS — R293 Abnormal posture: Secondary | ICD-10-CM | POA: Diagnosis not present

## 2016-08-22 DIAGNOSIS — I69351 Hemiplegia and hemiparesis following cerebral infarction affecting right dominant side: Secondary | ICD-10-CM | POA: Diagnosis not present

## 2016-08-22 DIAGNOSIS — R3981 Functional urinary incontinence: Secondary | ICD-10-CM | POA: Diagnosis not present

## 2016-08-22 DIAGNOSIS — M6281 Muscle weakness (generalized): Secondary | ICD-10-CM | POA: Diagnosis not present

## 2016-08-22 LAB — NEUROPATHY PANEL
A/G Ratio: 1.1 (ref 0.7–1.7)
ALPHA 1: 0.2 g/dL (ref 0.0–0.4)
ALPHA 2: 0.8 g/dL (ref 0.4–1.0)
Albumin ELP: 3.7 g/dL (ref 2.9–4.4)
Angio Convert Enzyme: <15 U/L (ref 14–82)
Anti Nuclear Antibody(ANA): NEGATIVE
Beta: 1 g/dL (ref 0.7–1.3)
Gamma Globulin: 1.3 g/dL (ref 0.4–1.8)
Globulin, Total: 3.3 g/dL (ref 2.2–3.9)
Rhuematoid fact SerPl-aCnc: 11.3 IU/mL (ref 0.0–13.9)
Sed Rate: 3 mm/hr (ref 0–40)
TOTAL PROTEIN: 7 g/dL (ref 6.0–8.5)
TSH: 2.2 u[IU]/mL (ref 0.450–4.500)
VIT D 25 HYDROXY: 50.6 ng/mL (ref 30.0–100.0)
VITAMIN B 12: 948 pg/mL (ref 232–1245)

## 2016-08-23 DIAGNOSIS — R3981 Functional urinary incontinence: Secondary | ICD-10-CM | POA: Diagnosis not present

## 2016-08-23 DIAGNOSIS — R293 Abnormal posture: Secondary | ICD-10-CM | POA: Diagnosis not present

## 2016-08-23 DIAGNOSIS — I69351 Hemiplegia and hemiparesis following cerebral infarction affecting right dominant side: Secondary | ICD-10-CM | POA: Diagnosis not present

## 2016-08-23 DIAGNOSIS — R262 Difficulty in walking, not elsewhere classified: Secondary | ICD-10-CM | POA: Diagnosis not present

## 2016-08-23 DIAGNOSIS — M545 Low back pain: Secondary | ICD-10-CM | POA: Diagnosis not present

## 2016-08-23 DIAGNOSIS — M6281 Muscle weakness (generalized): Secondary | ICD-10-CM | POA: Diagnosis not present

## 2016-08-24 ENCOUNTER — Ambulatory Visit (INDEPENDENT_AMBULATORY_CARE_PROVIDER_SITE_OTHER): Payer: Medicare Other | Admitting: Psychiatry

## 2016-08-24 ENCOUNTER — Encounter (HOSPITAL_COMMUNITY): Payer: Self-pay | Admitting: Psychiatry

## 2016-08-24 VITALS — BP 136/68 | HR 74 | Ht 62.5 in | Wt 116.0 lb

## 2016-08-24 DIAGNOSIS — I639 Cerebral infarction, unspecified: Secondary | ICD-10-CM | POA: Diagnosis not present

## 2016-08-24 DIAGNOSIS — Z79899 Other long term (current) drug therapy: Secondary | ICD-10-CM

## 2016-08-24 DIAGNOSIS — F325 Major depressive disorder, single episode, in full remission: Secondary | ICD-10-CM | POA: Diagnosis not present

## 2016-08-24 NOTE — Progress Notes (Signed)
Psychiatric Initial Adult Assessment   Patient Identification: Patricia Beck MRN:  [id number] Date of Evaluation:  08/24/2016 Referral Source: Primary care doctor Chief Complaint:   Visit Diagnosis: No diagnosis found.  History of Present Illness:   This patient is a 76 year old white female who is divorced and lives in [place]. [employer]. She recently moved from [place] from her own home because she needed a higher level of care. In January of this year she had a left CVA. She has little residual. She has some difficulty walking. In the hospital in January when she was in the rehabilitation program she apparently appeared depressed and not and she was started on Celexa 10 mg. She's previously been on other SSRIs and had side effects. Low-dose Celexa 10 mg the surgery well. Her mood is improved. She is less anxious. The patient has one daughter who lives in [place] and is married. She is no grandchildren. The patient is been a retired Camera operator since 2002. At this time the patient denies daily depression. She sleeping only fairly well. She takes Ambien 5 mg and at times takes trazodone. At this time she wishes to be on as little medicine as possible. She herself is reducing her Benadryl and found a small dose. The patient is eating well has good energy. She is no problems thinking and concentrating. She is a good sense of self. She feels worsening of things. She is a spiritual person. She's not suicidal now and never has been. She enjoys reading and listening to Internet. The patient denies the use of alcohol. She's never had psychotic symptoms. She denies clear episode of major depression for this year. She denies ever having symptoms of mania. She denies generalized anxiety disorder, panic disorder or excessive. Her medical history includes chronic back pain hypertension and a left CVA. The patient is never been in a psychiatric hospital. She's never been in therapy before. Patient is  doing well. She denies any physical symptoms at this time other than a sense that she is having difficulty walking. He uses a cane. Today she is continuing to show improvement. I think the change in ER see factor here she enjoys the patient lives this made some new friends. Is better than living independently in the community. She now gets all care she needs and also gets a lot of support from people around her. Associated Signs/Symptoms: Depression Symptoms:  fatigue, (Hypo) Manic Symptoms:   Anxiety Symptoms:   Psychotic Symptoms:   PTSD Symptoms:   Past Psychiatric History: Celexa 10 mg, Ambien 5 mg, trazodone  Previous Psychotropic Medications: Yes   Substance Abuse History in the last 12 months:  No.  Consequences of Substance Abuse: Negative  Past Medical History:  Past Medical History:  Diagnosis Date  . Cataract   . Fibroma 2006  . Hypertension   . Stroke ([place])   . Thyroid disease     Past Surgical History:  Procedure Laterality Date  . EYE SURGERY    . fibroma      Family Psychiatric History:   Family History:  Family History  Problem Relation Age of Onset  . Diabetes Father     Social History:   Social History   Social History  . Marital status: Widowed    Spouse name: N/A  . Number of children: N/A  . Years of education: N/A   Social History Main Topics  . Smoking status: Never Smoker  . Smokeless tobacco: Never Used  . Alcohol use  No  . Drug use: No  . Sexual activity: Not Currently   Other Topics Concern  . None   Social History Narrative  . None    Additional Social History:   Allergies:   Allergies  Allergen Reactions  . Aciphex [Rabeprazole Sodium]   . Aleve [Naproxen Sodium]   . Atarax [Hydroxyzine]   . Belsomra [Suvorexant] Other (See Comments)    Nightmares  . Buspirone     Anxious   . Codeine   . Erythromycin   . Flexeril [Cyclobenzaprine]     Muscle pain  . Keflex [Cephalexin]   . Lexapro [Escitalopram Oxalate]      anxious  . Lidocaine   . Lipitor [Atorvastatin]     Muscle pain  . Norvasc [Amlodipine Besylate]     Swelling in feet  . Novocain [Procaine]     Fast heart beat  . Prednisone     Insomnia, nausea, hot flush   . Tramadol   . Zoloft [Sertraline Hcl]     Metabolic Disorder Labs: Lab Results  Component Value Date   HGBA1C 5.7 (H) 04/28/2016   MPG 117 04/28/2016   MPG 134 12/09/2008   No results found for: PROLACTIN Lab Results  Component Value Date   CHOL 205 (H) 04/28/2016   TRIG 72 04/28/2016   HDL 60 04/28/2016   CHOLHDL 3.4 04/28/2016   VLDL 14 04/28/2016   LDLCALC 131 (H) 04/28/2016   LDLCALC (H) 12/09/2008    190        Total Cholesterol/HDL:CHD Risk Coronary Heart Disease Risk Table                     Men   Women  1/2 Average Risk   3.4   3.3  Average Risk       5.0   4.4  2 X Average Risk   9.6   7.1  3 X Average Risk  23.4   11.0        Use the calculated Patient Ratio above and the CHD Risk Table to determine the patient's CHD Risk.        ATP III CLASSIFICATION (LDL):  <100     mg/dL   Optimal  100-129  mg/dL   Near or Above                    Optimal  130-159  mg/dL   Borderline  160-189  mg/dL   High  >190     mg/dL   Very High     Current Medications: Current Outpatient Prescriptions  Medication Sig Dispense Refill  . acetaminophen (TYLENOL) 500 MG tablet Take 1,000 mg by mouth every 8 (eight) hours as needed for moderate pain (pain).     Marland Kitchen amLODipine (NORVASC) 10 MG tablet Take 1 tablet (10 mg total) by mouth daily. 30 tablet 2  . Biotin 1000 MCG tablet Take 1,000 mcg by mouth 3 (three) times daily.    . bisoprolol (ZEBETA) 10 MG tablet Take 1 tablet (10 mg total) by mouth daily. 30 tablet 2  . calcium citrate (CALCITRATE - DOSED IN MG ELEMENTAL CALCIUM) 950 MG tablet Take 200 mg of elemental calcium by mouth daily.    . Cholecalciferol (VITAMIN D3) 5000 units CAPS Take 1 capsule by mouth daily.    . citalopram (CELEXA) 10 MG tablet Take 1  tablet (10 mg total) by mouth daily. 30 tablet 2  . Cranberry-Vitamin C-Vitamin E (CRANBERRY PLUS VITAMIN  C) 4200-20-3 MG-MG-UNIT CAPS Take by mouth as needed.    . Diphenhydramine-PE-APAP 12.5-5-325 MG TABS Take 12.5 mg by mouth See admin instructions. 1 tablet 12.5 mg at 8 am and 1 tablet (12.5) at 2 pm. Then take 2 tablets (25 mg) at bedtime.    Marland Kitchen lisinopril (PRINIVIL,ZESTRIL) 5 MG tablet Take 5 mg by mouth daily.    . Melatonin 5 MG TABS Take 3 tablets by mouth at bedtime. 8 pm    . potassium chloride (KLOR-CON) 20 MEQ packet Take 20 mEq by mouth 2 (two) times daily. 3 packet 0  . Thyroid (NATURE-THROID PO) Take 65 mg by mouth.    . traZODone (DESYREL) 50 MG tablet Take 50 mg by mouth at bedtime.    Marland Kitchen UNABLE TO FIND Hemp oil, take a teaspoon in the mouth daily    . vitamin C (ASCORBIC ACID) 500 MG tablet Take 1,000 mg by mouth daily.    . vitamin E 100 UNIT capsule Take 100 Units by mouth daily.     Marland Kitchen zolpidem (AMBIEN) 5 MG tablet Take 5 mg by mouth at bedtime as needed for sleep.     No current facility-administered medications for this visit.     Neurologic: Headache: No Seizure: No Paresthesias:No  Musculoskeletal: Strength & Muscle Tone: within normal limits Gait & Station: unsteady Patient leans: Left  Psychiatric Specialty Exam: ROS  Blood pressure 136/68, pulse 74, height 5' 2.5" (1.588 m), weight 116 lb (52.6 kg).Body mass index is 20.88 kg/m.  General Appearance: Casual  Eye Contact:  Good  Speech:  Clear and Coherent  Volume:  Normal  Mood:  Euthymic  Affect:  Congruent  Thought Process:  Coherent  Orientation:  NA  Thought Content:  WDL  Suicidal Thoughts:  No  Homicidal Thoughts:  No  Memory:  NA  Judgement:  Good  Insight:  Good  Psychomotor Activity:  Normal  Concentration:    Recall:  Good  Fund of Knowledge:Good  Language: Good  Akathisia:  No  Handed:  Right  AIMS (if indicated):    Assets:  Desire for Improvement  ADL's:  Intact  Cognition:  WNL  Sleep:      Treatment Plan Summary:   at this time I believe the patient is doing well. We will continue Celexa 10 mg is very appropriate dose for her. I've asked her to discontinue her Benadryl she's planning to do and then to consider reducing her trazodone down to just taking 50 mg pill and less possible. The patient for now will continue on Ambien 5 mg. The patient is doing well. The environment is very helpful for her. She is involved done much better. I think she should stay on Celexa indefinitely. I shared with her that her depression likely was not only reactive might of been physiologically induced by having a left CVA. The patient agrees to continue her medicines and return to see me in 3 months. I believe she is very stable.   Jerral Ralph, MD 5/10/20184:40 PM

## 2016-08-25 DIAGNOSIS — R293 Abnormal posture: Secondary | ICD-10-CM | POA: Diagnosis not present

## 2016-08-25 DIAGNOSIS — M6281 Muscle weakness (generalized): Secondary | ICD-10-CM | POA: Diagnosis not present

## 2016-08-25 DIAGNOSIS — R3981 Functional urinary incontinence: Secondary | ICD-10-CM | POA: Diagnosis not present

## 2016-08-25 DIAGNOSIS — I69351 Hemiplegia and hemiparesis following cerebral infarction affecting right dominant side: Secondary | ICD-10-CM | POA: Diagnosis not present

## 2016-08-25 DIAGNOSIS — M545 Low back pain: Secondary | ICD-10-CM | POA: Diagnosis not present

## 2016-08-25 DIAGNOSIS — R262 Difficulty in walking, not elsewhere classified: Secondary | ICD-10-CM | POA: Diagnosis not present

## 2016-08-28 DIAGNOSIS — M545 Low back pain: Secondary | ICD-10-CM | POA: Diagnosis not present

## 2016-08-28 DIAGNOSIS — R262 Difficulty in walking, not elsewhere classified: Secondary | ICD-10-CM | POA: Diagnosis not present

## 2016-08-28 DIAGNOSIS — M6281 Muscle weakness (generalized): Secondary | ICD-10-CM | POA: Diagnosis not present

## 2016-08-28 DIAGNOSIS — R293 Abnormal posture: Secondary | ICD-10-CM | POA: Diagnosis not present

## 2016-08-28 DIAGNOSIS — I69351 Hemiplegia and hemiparesis following cerebral infarction affecting right dominant side: Secondary | ICD-10-CM | POA: Diagnosis not present

## 2016-08-28 DIAGNOSIS — R3981 Functional urinary incontinence: Secondary | ICD-10-CM | POA: Diagnosis not present

## 2016-08-29 ENCOUNTER — Telehealth: Payer: Self-pay

## 2016-08-29 DIAGNOSIS — R262 Difficulty in walking, not elsewhere classified: Secondary | ICD-10-CM | POA: Diagnosis not present

## 2016-08-29 DIAGNOSIS — R293 Abnormal posture: Secondary | ICD-10-CM | POA: Diagnosis not present

## 2016-08-29 DIAGNOSIS — E785 Hyperlipidemia, unspecified: Secondary | ICD-10-CM | POA: Diagnosis not present

## 2016-08-29 DIAGNOSIS — M6281 Muscle weakness (generalized): Secondary | ICD-10-CM | POA: Diagnosis not present

## 2016-08-29 DIAGNOSIS — R3981 Functional urinary incontinence: Secondary | ICD-10-CM | POA: Diagnosis not present

## 2016-08-29 DIAGNOSIS — I638 Other cerebral infarction: Secondary | ICD-10-CM | POA: Diagnosis not present

## 2016-08-29 DIAGNOSIS — I69351 Hemiplegia and hemiparesis following cerebral infarction affecting right dominant side: Secondary | ICD-10-CM | POA: Diagnosis not present

## 2016-08-29 DIAGNOSIS — E039 Hypothyroidism, unspecified: Secondary | ICD-10-CM | POA: Diagnosis not present

## 2016-08-29 DIAGNOSIS — G4700 Insomnia, unspecified: Secondary | ICD-10-CM | POA: Diagnosis not present

## 2016-08-29 DIAGNOSIS — M545 Low back pain: Secondary | ICD-10-CM | POA: Diagnosis not present

## 2016-08-29 NOTE — Telephone Encounter (Signed)
Rn call patient that her neuropathy panel was normal. Pt verbalized understanding. Pt stated she wanted to cancel the emg,nvc test schedule on 09/13/2016. Pt stated she had the test in the past and did not want it. Rn explain that Dr. Leonie Man is trying to do a work up. Pt stated she does not want the test, because she had it in the past. Rn stated both test will be cancel per her request.

## 2016-08-29 NOTE — Telephone Encounter (Signed)
-----   Message from Garvin Fila, MD sent at 08/28/2016  6:27 PM EDT ----- Mitchell Heir inform the patient that all neuropathy panel lab work was normal

## 2016-08-31 DIAGNOSIS — M6281 Muscle weakness (generalized): Secondary | ICD-10-CM | POA: Diagnosis not present

## 2016-08-31 DIAGNOSIS — R3981 Functional urinary incontinence: Secondary | ICD-10-CM | POA: Diagnosis not present

## 2016-08-31 DIAGNOSIS — I69351 Hemiplegia and hemiparesis following cerebral infarction affecting right dominant side: Secondary | ICD-10-CM | POA: Diagnosis not present

## 2016-08-31 DIAGNOSIS — M545 Low back pain: Secondary | ICD-10-CM | POA: Diagnosis not present

## 2016-08-31 DIAGNOSIS — R262 Difficulty in walking, not elsewhere classified: Secondary | ICD-10-CM | POA: Diagnosis not present

## 2016-08-31 DIAGNOSIS — R293 Abnormal posture: Secondary | ICD-10-CM | POA: Diagnosis not present

## 2016-09-01 DIAGNOSIS — M6281 Muscle weakness (generalized): Secondary | ICD-10-CM | POA: Diagnosis not present

## 2016-09-01 DIAGNOSIS — R262 Difficulty in walking, not elsewhere classified: Secondary | ICD-10-CM | POA: Diagnosis not present

## 2016-09-01 DIAGNOSIS — R293 Abnormal posture: Secondary | ICD-10-CM | POA: Diagnosis not present

## 2016-09-01 DIAGNOSIS — M545 Low back pain: Secondary | ICD-10-CM | POA: Diagnosis not present

## 2016-09-01 DIAGNOSIS — I69351 Hemiplegia and hemiparesis following cerebral infarction affecting right dominant side: Secondary | ICD-10-CM | POA: Diagnosis not present

## 2016-09-01 DIAGNOSIS — R3981 Functional urinary incontinence: Secondary | ICD-10-CM | POA: Diagnosis not present

## 2016-09-04 DIAGNOSIS — R3981 Functional urinary incontinence: Secondary | ICD-10-CM | POA: Diagnosis not present

## 2016-09-04 DIAGNOSIS — M545 Low back pain: Secondary | ICD-10-CM | POA: Diagnosis not present

## 2016-09-04 DIAGNOSIS — R262 Difficulty in walking, not elsewhere classified: Secondary | ICD-10-CM | POA: Diagnosis not present

## 2016-09-04 DIAGNOSIS — M6281 Muscle weakness (generalized): Secondary | ICD-10-CM | POA: Diagnosis not present

## 2016-09-04 DIAGNOSIS — R293 Abnormal posture: Secondary | ICD-10-CM | POA: Diagnosis not present

## 2016-09-04 DIAGNOSIS — I69351 Hemiplegia and hemiparesis following cerebral infarction affecting right dominant side: Secondary | ICD-10-CM | POA: Diagnosis not present

## 2016-09-05 DIAGNOSIS — N819 Female genital prolapse, unspecified: Secondary | ICD-10-CM | POA: Diagnosis not present

## 2016-09-05 DIAGNOSIS — N760 Acute vaginitis: Secondary | ICD-10-CM | POA: Diagnosis not present

## 2016-09-06 DIAGNOSIS — R3981 Functional urinary incontinence: Secondary | ICD-10-CM | POA: Diagnosis not present

## 2016-09-06 DIAGNOSIS — R262 Difficulty in walking, not elsewhere classified: Secondary | ICD-10-CM | POA: Diagnosis not present

## 2016-09-06 DIAGNOSIS — M545 Low back pain: Secondary | ICD-10-CM | POA: Diagnosis not present

## 2016-09-06 DIAGNOSIS — I69351 Hemiplegia and hemiparesis following cerebral infarction affecting right dominant side: Secondary | ICD-10-CM | POA: Diagnosis not present

## 2016-09-06 DIAGNOSIS — M6281 Muscle weakness (generalized): Secondary | ICD-10-CM | POA: Diagnosis not present

## 2016-09-06 DIAGNOSIS — R293 Abnormal posture: Secondary | ICD-10-CM | POA: Diagnosis not present

## 2016-09-07 DIAGNOSIS — R262 Difficulty in walking, not elsewhere classified: Secondary | ICD-10-CM | POA: Diagnosis not present

## 2016-09-07 DIAGNOSIS — R3981 Functional urinary incontinence: Secondary | ICD-10-CM | POA: Diagnosis not present

## 2016-09-07 DIAGNOSIS — M6281 Muscle weakness (generalized): Secondary | ICD-10-CM | POA: Diagnosis not present

## 2016-09-07 DIAGNOSIS — R293 Abnormal posture: Secondary | ICD-10-CM | POA: Diagnosis not present

## 2016-09-07 DIAGNOSIS — I69351 Hemiplegia and hemiparesis following cerebral infarction affecting right dominant side: Secondary | ICD-10-CM | POA: Diagnosis not present

## 2016-09-07 DIAGNOSIS — M545 Low back pain: Secondary | ICD-10-CM | POA: Diagnosis not present

## 2016-09-11 DIAGNOSIS — R3981 Functional urinary incontinence: Secondary | ICD-10-CM | POA: Diagnosis not present

## 2016-09-11 DIAGNOSIS — M545 Low back pain: Secondary | ICD-10-CM | POA: Diagnosis not present

## 2016-09-11 DIAGNOSIS — R262 Difficulty in walking, not elsewhere classified: Secondary | ICD-10-CM | POA: Diagnosis not present

## 2016-09-11 DIAGNOSIS — M6281 Muscle weakness (generalized): Secondary | ICD-10-CM | POA: Diagnosis not present

## 2016-09-11 DIAGNOSIS — I69351 Hemiplegia and hemiparesis following cerebral infarction affecting right dominant side: Secondary | ICD-10-CM | POA: Diagnosis not present

## 2016-09-11 DIAGNOSIS — R293 Abnormal posture: Secondary | ICD-10-CM | POA: Diagnosis not present

## 2016-09-12 DIAGNOSIS — M6281 Muscle weakness (generalized): Secondary | ICD-10-CM | POA: Diagnosis not present

## 2016-09-12 DIAGNOSIS — R262 Difficulty in walking, not elsewhere classified: Secondary | ICD-10-CM | POA: Diagnosis not present

## 2016-09-12 DIAGNOSIS — I69351 Hemiplegia and hemiparesis following cerebral infarction affecting right dominant side: Secondary | ICD-10-CM | POA: Diagnosis not present

## 2016-09-12 DIAGNOSIS — M545 Low back pain: Secondary | ICD-10-CM | POA: Diagnosis not present

## 2016-09-12 DIAGNOSIS — R3981 Functional urinary incontinence: Secondary | ICD-10-CM | POA: Diagnosis not present

## 2016-09-12 DIAGNOSIS — R293 Abnormal posture: Secondary | ICD-10-CM | POA: Diagnosis not present

## 2016-09-13 ENCOUNTER — Encounter: Payer: Medicare Other | Admitting: Neurology

## 2016-09-13 DIAGNOSIS — M545 Low back pain: Secondary | ICD-10-CM | POA: Diagnosis not present

## 2016-09-13 DIAGNOSIS — R262 Difficulty in walking, not elsewhere classified: Secondary | ICD-10-CM | POA: Diagnosis not present

## 2016-09-13 DIAGNOSIS — R3981 Functional urinary incontinence: Secondary | ICD-10-CM | POA: Diagnosis not present

## 2016-09-13 DIAGNOSIS — R293 Abnormal posture: Secondary | ICD-10-CM | POA: Diagnosis not present

## 2016-09-13 DIAGNOSIS — M6281 Muscle weakness (generalized): Secondary | ICD-10-CM | POA: Diagnosis not present

## 2016-09-13 DIAGNOSIS — I69351 Hemiplegia and hemiparesis following cerebral infarction affecting right dominant side: Secondary | ICD-10-CM | POA: Diagnosis not present

## 2016-09-14 DIAGNOSIS — I69351 Hemiplegia and hemiparesis following cerebral infarction affecting right dominant side: Secondary | ICD-10-CM | POA: Diagnosis not present

## 2016-09-14 DIAGNOSIS — R293 Abnormal posture: Secondary | ICD-10-CM | POA: Diagnosis not present

## 2016-09-14 DIAGNOSIS — R3981 Functional urinary incontinence: Secondary | ICD-10-CM | POA: Diagnosis not present

## 2016-09-14 DIAGNOSIS — R262 Difficulty in walking, not elsewhere classified: Secondary | ICD-10-CM | POA: Diagnosis not present

## 2016-09-14 DIAGNOSIS — M6281 Muscle weakness (generalized): Secondary | ICD-10-CM | POA: Diagnosis not present

## 2016-09-14 DIAGNOSIS — M545 Low back pain: Secondary | ICD-10-CM | POA: Diagnosis not present

## 2016-09-15 DIAGNOSIS — M6281 Muscle weakness (generalized): Secondary | ICD-10-CM | POA: Diagnosis not present

## 2016-09-15 DIAGNOSIS — R293 Abnormal posture: Secondary | ICD-10-CM | POA: Diagnosis not present

## 2016-09-15 DIAGNOSIS — R3981 Functional urinary incontinence: Secondary | ICD-10-CM | POA: Diagnosis not present

## 2016-09-15 DIAGNOSIS — R262 Difficulty in walking, not elsewhere classified: Secondary | ICD-10-CM | POA: Diagnosis not present

## 2016-09-15 DIAGNOSIS — M545 Low back pain: Secondary | ICD-10-CM | POA: Diagnosis not present

## 2016-09-15 DIAGNOSIS — I69351 Hemiplegia and hemiparesis following cerebral infarction affecting right dominant side: Secondary | ICD-10-CM | POA: Diagnosis not present

## 2016-09-18 DIAGNOSIS — I69351 Hemiplegia and hemiparesis following cerebral infarction affecting right dominant side: Secondary | ICD-10-CM | POA: Diagnosis not present

## 2016-09-18 DIAGNOSIS — R262 Difficulty in walking, not elsewhere classified: Secondary | ICD-10-CM | POA: Diagnosis not present

## 2016-09-18 DIAGNOSIS — R293 Abnormal posture: Secondary | ICD-10-CM | POA: Diagnosis not present

## 2016-09-18 DIAGNOSIS — R3981 Functional urinary incontinence: Secondary | ICD-10-CM | POA: Diagnosis not present

## 2016-09-18 DIAGNOSIS — M6281 Muscle weakness (generalized): Secondary | ICD-10-CM | POA: Diagnosis not present

## 2016-09-18 DIAGNOSIS — M545 Low back pain: Secondary | ICD-10-CM | POA: Diagnosis not present

## 2016-09-20 DIAGNOSIS — R262 Difficulty in walking, not elsewhere classified: Secondary | ICD-10-CM | POA: Diagnosis not present

## 2016-09-20 DIAGNOSIS — R3981 Functional urinary incontinence: Secondary | ICD-10-CM | POA: Diagnosis not present

## 2016-09-20 DIAGNOSIS — I69351 Hemiplegia and hemiparesis following cerebral infarction affecting right dominant side: Secondary | ICD-10-CM | POA: Diagnosis not present

## 2016-09-20 DIAGNOSIS — M545 Low back pain: Secondary | ICD-10-CM | POA: Diagnosis not present

## 2016-09-20 DIAGNOSIS — R293 Abnormal posture: Secondary | ICD-10-CM | POA: Diagnosis not present

## 2016-09-20 DIAGNOSIS — M6281 Muscle weakness (generalized): Secondary | ICD-10-CM | POA: Diagnosis not present

## 2016-09-21 DIAGNOSIS — R3981 Functional urinary incontinence: Secondary | ICD-10-CM | POA: Diagnosis not present

## 2016-09-21 DIAGNOSIS — R262 Difficulty in walking, not elsewhere classified: Secondary | ICD-10-CM | POA: Diagnosis not present

## 2016-09-21 DIAGNOSIS — R293 Abnormal posture: Secondary | ICD-10-CM | POA: Diagnosis not present

## 2016-09-21 DIAGNOSIS — M545 Low back pain: Secondary | ICD-10-CM | POA: Diagnosis not present

## 2016-09-21 DIAGNOSIS — I69351 Hemiplegia and hemiparesis following cerebral infarction affecting right dominant side: Secondary | ICD-10-CM | POA: Diagnosis not present

## 2016-09-21 DIAGNOSIS — M6281 Muscle weakness (generalized): Secondary | ICD-10-CM | POA: Diagnosis not present

## 2016-09-25 DIAGNOSIS — I69351 Hemiplegia and hemiparesis following cerebral infarction affecting right dominant side: Secondary | ICD-10-CM | POA: Diagnosis not present

## 2016-09-25 DIAGNOSIS — R293 Abnormal posture: Secondary | ICD-10-CM | POA: Diagnosis not present

## 2016-09-25 DIAGNOSIS — M545 Low back pain: Secondary | ICD-10-CM | POA: Diagnosis not present

## 2016-09-25 DIAGNOSIS — R262 Difficulty in walking, not elsewhere classified: Secondary | ICD-10-CM | POA: Diagnosis not present

## 2016-09-25 DIAGNOSIS — M6281 Muscle weakness (generalized): Secondary | ICD-10-CM | POA: Diagnosis not present

## 2016-09-25 DIAGNOSIS — R3981 Functional urinary incontinence: Secondary | ICD-10-CM | POA: Diagnosis not present

## 2016-09-27 DIAGNOSIS — R293 Abnormal posture: Secondary | ICD-10-CM | POA: Diagnosis not present

## 2016-09-27 DIAGNOSIS — M545 Low back pain: Secondary | ICD-10-CM | POA: Diagnosis not present

## 2016-09-27 DIAGNOSIS — R262 Difficulty in walking, not elsewhere classified: Secondary | ICD-10-CM | POA: Diagnosis not present

## 2016-09-27 DIAGNOSIS — I69351 Hemiplegia and hemiparesis following cerebral infarction affecting right dominant side: Secondary | ICD-10-CM | POA: Diagnosis not present

## 2016-09-27 DIAGNOSIS — M6281 Muscle weakness (generalized): Secondary | ICD-10-CM | POA: Diagnosis not present

## 2016-09-27 DIAGNOSIS — R3981 Functional urinary incontinence: Secondary | ICD-10-CM | POA: Diagnosis not present

## 2016-09-28 DIAGNOSIS — R293 Abnormal posture: Secondary | ICD-10-CM | POA: Diagnosis not present

## 2016-09-28 DIAGNOSIS — M6281 Muscle weakness (generalized): Secondary | ICD-10-CM | POA: Diagnosis not present

## 2016-09-28 DIAGNOSIS — R262 Difficulty in walking, not elsewhere classified: Secondary | ICD-10-CM | POA: Diagnosis not present

## 2016-09-28 DIAGNOSIS — I69351 Hemiplegia and hemiparesis following cerebral infarction affecting right dominant side: Secondary | ICD-10-CM | POA: Diagnosis not present

## 2016-09-28 DIAGNOSIS — M545 Low back pain: Secondary | ICD-10-CM | POA: Diagnosis not present

## 2016-09-28 DIAGNOSIS — R3981 Functional urinary incontinence: Secondary | ICD-10-CM | POA: Diagnosis not present

## 2016-09-29 DIAGNOSIS — I69351 Hemiplegia and hemiparesis following cerebral infarction affecting right dominant side: Secondary | ICD-10-CM | POA: Diagnosis not present

## 2016-09-29 DIAGNOSIS — R3981 Functional urinary incontinence: Secondary | ICD-10-CM | POA: Diagnosis not present

## 2016-09-29 DIAGNOSIS — M6281 Muscle weakness (generalized): Secondary | ICD-10-CM | POA: Diagnosis not present

## 2016-09-29 DIAGNOSIS — M545 Low back pain: Secondary | ICD-10-CM | POA: Diagnosis not present

## 2016-09-29 DIAGNOSIS — R293 Abnormal posture: Secondary | ICD-10-CM | POA: Diagnosis not present

## 2016-09-29 DIAGNOSIS — R262 Difficulty in walking, not elsewhere classified: Secondary | ICD-10-CM | POA: Diagnosis not present

## 2016-10-02 DIAGNOSIS — I69351 Hemiplegia and hemiparesis following cerebral infarction affecting right dominant side: Secondary | ICD-10-CM | POA: Diagnosis not present

## 2016-10-02 DIAGNOSIS — R262 Difficulty in walking, not elsewhere classified: Secondary | ICD-10-CM | POA: Diagnosis not present

## 2016-10-02 DIAGNOSIS — M6281 Muscle weakness (generalized): Secondary | ICD-10-CM | POA: Diagnosis not present

## 2016-10-02 DIAGNOSIS — M545 Low back pain: Secondary | ICD-10-CM | POA: Diagnosis not present

## 2016-10-02 DIAGNOSIS — R3981 Functional urinary incontinence: Secondary | ICD-10-CM | POA: Diagnosis not present

## 2016-10-02 DIAGNOSIS — R293 Abnormal posture: Secondary | ICD-10-CM | POA: Diagnosis not present

## 2016-10-03 DIAGNOSIS — N760 Acute vaginitis: Secondary | ICD-10-CM | POA: Diagnosis not present

## 2016-10-03 DIAGNOSIS — N8189 Other female genital prolapse: Secondary | ICD-10-CM | POA: Diagnosis not present

## 2016-10-04 DIAGNOSIS — R293 Abnormal posture: Secondary | ICD-10-CM | POA: Diagnosis not present

## 2016-10-04 DIAGNOSIS — R262 Difficulty in walking, not elsewhere classified: Secondary | ICD-10-CM | POA: Diagnosis not present

## 2016-10-04 DIAGNOSIS — M545 Low back pain: Secondary | ICD-10-CM | POA: Diagnosis not present

## 2016-10-04 DIAGNOSIS — M6281 Muscle weakness (generalized): Secondary | ICD-10-CM | POA: Diagnosis not present

## 2016-10-04 DIAGNOSIS — I69351 Hemiplegia and hemiparesis following cerebral infarction affecting right dominant side: Secondary | ICD-10-CM | POA: Diagnosis not present

## 2016-10-04 DIAGNOSIS — R3981 Functional urinary incontinence: Secondary | ICD-10-CM | POA: Diagnosis not present

## 2016-10-05 DIAGNOSIS — R262 Difficulty in walking, not elsewhere classified: Secondary | ICD-10-CM | POA: Diagnosis not present

## 2016-10-05 DIAGNOSIS — R293 Abnormal posture: Secondary | ICD-10-CM | POA: Diagnosis not present

## 2016-10-05 DIAGNOSIS — M6281 Muscle weakness (generalized): Secondary | ICD-10-CM | POA: Diagnosis not present

## 2016-10-05 DIAGNOSIS — R3981 Functional urinary incontinence: Secondary | ICD-10-CM | POA: Diagnosis not present

## 2016-10-05 DIAGNOSIS — I69351 Hemiplegia and hemiparesis following cerebral infarction affecting right dominant side: Secondary | ICD-10-CM | POA: Diagnosis not present

## 2016-10-05 DIAGNOSIS — M545 Low back pain: Secondary | ICD-10-CM | POA: Diagnosis not present

## 2016-10-09 DIAGNOSIS — R262 Difficulty in walking, not elsewhere classified: Secondary | ICD-10-CM | POA: Diagnosis not present

## 2016-10-09 DIAGNOSIS — R3981 Functional urinary incontinence: Secondary | ICD-10-CM | POA: Diagnosis not present

## 2016-10-09 DIAGNOSIS — M6281 Muscle weakness (generalized): Secondary | ICD-10-CM | POA: Diagnosis not present

## 2016-10-09 DIAGNOSIS — M545 Low back pain: Secondary | ICD-10-CM | POA: Diagnosis not present

## 2016-10-09 DIAGNOSIS — R293 Abnormal posture: Secondary | ICD-10-CM | POA: Diagnosis not present

## 2016-10-09 DIAGNOSIS — I69351 Hemiplegia and hemiparesis following cerebral infarction affecting right dominant side: Secondary | ICD-10-CM | POA: Diagnosis not present

## 2016-10-11 DIAGNOSIS — I69351 Hemiplegia and hemiparesis following cerebral infarction affecting right dominant side: Secondary | ICD-10-CM | POA: Diagnosis not present

## 2016-10-11 DIAGNOSIS — R262 Difficulty in walking, not elsewhere classified: Secondary | ICD-10-CM | POA: Diagnosis not present

## 2016-10-11 DIAGNOSIS — M545 Low back pain: Secondary | ICD-10-CM | POA: Diagnosis not present

## 2016-10-11 DIAGNOSIS — M6281 Muscle weakness (generalized): Secondary | ICD-10-CM | POA: Diagnosis not present

## 2016-10-11 DIAGNOSIS — R293 Abnormal posture: Secondary | ICD-10-CM | POA: Diagnosis not present

## 2016-10-11 DIAGNOSIS — R3981 Functional urinary incontinence: Secondary | ICD-10-CM | POA: Diagnosis not present

## 2016-10-12 DIAGNOSIS — R262 Difficulty in walking, not elsewhere classified: Secondary | ICD-10-CM | POA: Diagnosis not present

## 2016-10-12 DIAGNOSIS — M545 Low back pain: Secondary | ICD-10-CM | POA: Diagnosis not present

## 2016-10-12 DIAGNOSIS — I69351 Hemiplegia and hemiparesis following cerebral infarction affecting right dominant side: Secondary | ICD-10-CM | POA: Diagnosis not present

## 2016-10-12 DIAGNOSIS — R3981 Functional urinary incontinence: Secondary | ICD-10-CM | POA: Diagnosis not present

## 2016-10-12 DIAGNOSIS — R293 Abnormal posture: Secondary | ICD-10-CM | POA: Diagnosis not present

## 2016-10-12 DIAGNOSIS — M6281 Muscle weakness (generalized): Secondary | ICD-10-CM | POA: Diagnosis not present

## 2016-10-13 DIAGNOSIS — R262 Difficulty in walking, not elsewhere classified: Secondary | ICD-10-CM | POA: Diagnosis not present

## 2016-10-13 DIAGNOSIS — I69351 Hemiplegia and hemiparesis following cerebral infarction affecting right dominant side: Secondary | ICD-10-CM | POA: Diagnosis not present

## 2016-10-13 DIAGNOSIS — R293 Abnormal posture: Secondary | ICD-10-CM | POA: Diagnosis not present

## 2016-10-13 DIAGNOSIS — M6281 Muscle weakness (generalized): Secondary | ICD-10-CM | POA: Diagnosis not present

## 2016-10-13 DIAGNOSIS — M545 Low back pain: Secondary | ICD-10-CM | POA: Diagnosis not present

## 2016-10-13 DIAGNOSIS — R3981 Functional urinary incontinence: Secondary | ICD-10-CM | POA: Diagnosis not present

## 2016-10-16 DIAGNOSIS — M545 Low back pain: Secondary | ICD-10-CM | POA: Diagnosis not present

## 2016-10-16 DIAGNOSIS — R262 Difficulty in walking, not elsewhere classified: Secondary | ICD-10-CM | POA: Diagnosis not present

## 2016-10-16 DIAGNOSIS — R293 Abnormal posture: Secondary | ICD-10-CM | POA: Diagnosis not present

## 2016-10-16 DIAGNOSIS — M6281 Muscle weakness (generalized): Secondary | ICD-10-CM | POA: Diagnosis not present

## 2016-10-16 DIAGNOSIS — I69351 Hemiplegia and hemiparesis following cerebral infarction affecting right dominant side: Secondary | ICD-10-CM | POA: Diagnosis not present

## 2016-10-16 DIAGNOSIS — R3981 Functional urinary incontinence: Secondary | ICD-10-CM | POA: Diagnosis not present

## 2016-10-17 DIAGNOSIS — M545 Low back pain: Secondary | ICD-10-CM | POA: Diagnosis not present

## 2016-10-17 DIAGNOSIS — I69351 Hemiplegia and hemiparesis following cerebral infarction affecting right dominant side: Secondary | ICD-10-CM | POA: Diagnosis not present

## 2016-10-17 DIAGNOSIS — R3981 Functional urinary incontinence: Secondary | ICD-10-CM | POA: Diagnosis not present

## 2016-10-17 DIAGNOSIS — M6281 Muscle weakness (generalized): Secondary | ICD-10-CM | POA: Diagnosis not present

## 2016-10-17 DIAGNOSIS — R293 Abnormal posture: Secondary | ICD-10-CM | POA: Diagnosis not present

## 2016-10-17 DIAGNOSIS — R262 Difficulty in walking, not elsewhere classified: Secondary | ICD-10-CM | POA: Diagnosis not present

## 2016-10-19 DIAGNOSIS — M545 Low back pain: Secondary | ICD-10-CM | POA: Diagnosis not present

## 2016-10-19 DIAGNOSIS — R262 Difficulty in walking, not elsewhere classified: Secondary | ICD-10-CM | POA: Diagnosis not present

## 2016-10-19 DIAGNOSIS — R3981 Functional urinary incontinence: Secondary | ICD-10-CM | POA: Diagnosis not present

## 2016-10-19 DIAGNOSIS — I69351 Hemiplegia and hemiparesis following cerebral infarction affecting right dominant side: Secondary | ICD-10-CM | POA: Diagnosis not present

## 2016-10-19 DIAGNOSIS — M6281 Muscle weakness (generalized): Secondary | ICD-10-CM | POA: Diagnosis not present

## 2016-10-19 DIAGNOSIS — R293 Abnormal posture: Secondary | ICD-10-CM | POA: Diagnosis not present

## 2016-10-20 DIAGNOSIS — R262 Difficulty in walking, not elsewhere classified: Secondary | ICD-10-CM | POA: Diagnosis not present

## 2016-10-20 DIAGNOSIS — M545 Low back pain: Secondary | ICD-10-CM | POA: Diagnosis not present

## 2016-10-20 DIAGNOSIS — I69351 Hemiplegia and hemiparesis following cerebral infarction affecting right dominant side: Secondary | ICD-10-CM | POA: Diagnosis not present

## 2016-10-20 DIAGNOSIS — R3981 Functional urinary incontinence: Secondary | ICD-10-CM | POA: Diagnosis not present

## 2016-10-20 DIAGNOSIS — R293 Abnormal posture: Secondary | ICD-10-CM | POA: Diagnosis not present

## 2016-10-20 DIAGNOSIS — M6281 Muscle weakness (generalized): Secondary | ICD-10-CM | POA: Diagnosis not present

## 2016-10-23 DIAGNOSIS — R3981 Functional urinary incontinence: Secondary | ICD-10-CM | POA: Diagnosis not present

## 2016-10-23 DIAGNOSIS — I69351 Hemiplegia and hemiparesis following cerebral infarction affecting right dominant side: Secondary | ICD-10-CM | POA: Diagnosis not present

## 2016-10-23 DIAGNOSIS — R262 Difficulty in walking, not elsewhere classified: Secondary | ICD-10-CM | POA: Diagnosis not present

## 2016-10-23 DIAGNOSIS — M545 Low back pain: Secondary | ICD-10-CM | POA: Diagnosis not present

## 2016-10-23 DIAGNOSIS — R293 Abnormal posture: Secondary | ICD-10-CM | POA: Diagnosis not present

## 2016-10-23 DIAGNOSIS — M6281 Muscle weakness (generalized): Secondary | ICD-10-CM | POA: Diagnosis not present

## 2016-10-24 DIAGNOSIS — M6281 Muscle weakness (generalized): Secondary | ICD-10-CM | POA: Diagnosis not present

## 2016-10-24 DIAGNOSIS — I69351 Hemiplegia and hemiparesis following cerebral infarction affecting right dominant side: Secondary | ICD-10-CM | POA: Diagnosis not present

## 2016-10-24 DIAGNOSIS — R262 Difficulty in walking, not elsewhere classified: Secondary | ICD-10-CM | POA: Diagnosis not present

## 2016-10-24 DIAGNOSIS — R293 Abnormal posture: Secondary | ICD-10-CM | POA: Diagnosis not present

## 2016-10-24 DIAGNOSIS — R3981 Functional urinary incontinence: Secondary | ICD-10-CM | POA: Diagnosis not present

## 2016-10-24 DIAGNOSIS — M545 Low back pain: Secondary | ICD-10-CM | POA: Diagnosis not present

## 2016-10-26 DIAGNOSIS — M6281 Muscle weakness (generalized): Secondary | ICD-10-CM | POA: Diagnosis not present

## 2016-10-26 DIAGNOSIS — R3981 Functional urinary incontinence: Secondary | ICD-10-CM | POA: Diagnosis not present

## 2016-10-26 DIAGNOSIS — R293 Abnormal posture: Secondary | ICD-10-CM | POA: Diagnosis not present

## 2016-10-26 DIAGNOSIS — R262 Difficulty in walking, not elsewhere classified: Secondary | ICD-10-CM | POA: Diagnosis not present

## 2016-10-26 DIAGNOSIS — M545 Low back pain: Secondary | ICD-10-CM | POA: Diagnosis not present

## 2016-10-26 DIAGNOSIS — I69351 Hemiplegia and hemiparesis following cerebral infarction affecting right dominant side: Secondary | ICD-10-CM | POA: Diagnosis not present

## 2016-10-27 DIAGNOSIS — R262 Difficulty in walking, not elsewhere classified: Secondary | ICD-10-CM | POA: Diagnosis not present

## 2016-10-27 DIAGNOSIS — M545 Low back pain: Secondary | ICD-10-CM | POA: Diagnosis not present

## 2016-10-27 DIAGNOSIS — R293 Abnormal posture: Secondary | ICD-10-CM | POA: Diagnosis not present

## 2016-10-27 DIAGNOSIS — M6281 Muscle weakness (generalized): Secondary | ICD-10-CM | POA: Diagnosis not present

## 2016-10-27 DIAGNOSIS — I69351 Hemiplegia and hemiparesis following cerebral infarction affecting right dominant side: Secondary | ICD-10-CM | POA: Diagnosis not present

## 2016-10-27 DIAGNOSIS — R3981 Functional urinary incontinence: Secondary | ICD-10-CM | POA: Diagnosis not present

## 2016-10-30 DIAGNOSIS — N760 Acute vaginitis: Secondary | ICD-10-CM | POA: Diagnosis not present

## 2016-10-30 DIAGNOSIS — R3981 Functional urinary incontinence: Secondary | ICD-10-CM | POA: Diagnosis not present

## 2016-10-30 DIAGNOSIS — R293 Abnormal posture: Secondary | ICD-10-CM | POA: Diagnosis not present

## 2016-10-30 DIAGNOSIS — M6281 Muscle weakness (generalized): Secondary | ICD-10-CM | POA: Diagnosis not present

## 2016-10-30 DIAGNOSIS — M545 Low back pain: Secondary | ICD-10-CM | POA: Diagnosis not present

## 2016-10-30 DIAGNOSIS — R262 Difficulty in walking, not elsewhere classified: Secondary | ICD-10-CM | POA: Diagnosis not present

## 2016-10-30 DIAGNOSIS — I69351 Hemiplegia and hemiparesis following cerebral infarction affecting right dominant side: Secondary | ICD-10-CM | POA: Diagnosis not present

## 2016-10-30 DIAGNOSIS — N8189 Other female genital prolapse: Secondary | ICD-10-CM | POA: Diagnosis not present

## 2016-10-31 DIAGNOSIS — R3981 Functional urinary incontinence: Secondary | ICD-10-CM | POA: Diagnosis not present

## 2016-10-31 DIAGNOSIS — M6281 Muscle weakness (generalized): Secondary | ICD-10-CM | POA: Diagnosis not present

## 2016-10-31 DIAGNOSIS — R293 Abnormal posture: Secondary | ICD-10-CM | POA: Diagnosis not present

## 2016-10-31 DIAGNOSIS — R262 Difficulty in walking, not elsewhere classified: Secondary | ICD-10-CM | POA: Diagnosis not present

## 2016-10-31 DIAGNOSIS — I69351 Hemiplegia and hemiparesis following cerebral infarction affecting right dominant side: Secondary | ICD-10-CM | POA: Diagnosis not present

## 2016-10-31 DIAGNOSIS — M545 Low back pain: Secondary | ICD-10-CM | POA: Diagnosis not present

## 2016-11-01 DIAGNOSIS — M6281 Muscle weakness (generalized): Secondary | ICD-10-CM | POA: Diagnosis not present

## 2016-11-01 DIAGNOSIS — I69351 Hemiplegia and hemiparesis following cerebral infarction affecting right dominant side: Secondary | ICD-10-CM | POA: Diagnosis not present

## 2016-11-01 DIAGNOSIS — M545 Low back pain: Secondary | ICD-10-CM | POA: Diagnosis not present

## 2016-11-01 DIAGNOSIS — R3981 Functional urinary incontinence: Secondary | ICD-10-CM | POA: Diagnosis not present

## 2016-11-01 DIAGNOSIS — R262 Difficulty in walking, not elsewhere classified: Secondary | ICD-10-CM | POA: Diagnosis not present

## 2016-11-01 DIAGNOSIS — R293 Abnormal posture: Secondary | ICD-10-CM | POA: Diagnosis not present

## 2016-11-02 DIAGNOSIS — R262 Difficulty in walking, not elsewhere classified: Secondary | ICD-10-CM | POA: Diagnosis not present

## 2016-11-02 DIAGNOSIS — M6281 Muscle weakness (generalized): Secondary | ICD-10-CM | POA: Diagnosis not present

## 2016-11-02 DIAGNOSIS — R3981 Functional urinary incontinence: Secondary | ICD-10-CM | POA: Diagnosis not present

## 2016-11-02 DIAGNOSIS — I69351 Hemiplegia and hemiparesis following cerebral infarction affecting right dominant side: Secondary | ICD-10-CM | POA: Diagnosis not present

## 2016-11-02 DIAGNOSIS — R293 Abnormal posture: Secondary | ICD-10-CM | POA: Diagnosis not present

## 2016-11-02 DIAGNOSIS — M545 Low back pain: Secondary | ICD-10-CM | POA: Diagnosis not present

## 2016-11-03 DIAGNOSIS — R293 Abnormal posture: Secondary | ICD-10-CM | POA: Diagnosis not present

## 2016-11-03 DIAGNOSIS — R262 Difficulty in walking, not elsewhere classified: Secondary | ICD-10-CM | POA: Diagnosis not present

## 2016-11-03 DIAGNOSIS — M6281 Muscle weakness (generalized): Secondary | ICD-10-CM | POA: Diagnosis not present

## 2016-11-03 DIAGNOSIS — R3981 Functional urinary incontinence: Secondary | ICD-10-CM | POA: Diagnosis not present

## 2016-11-03 DIAGNOSIS — M545 Low back pain: Secondary | ICD-10-CM | POA: Diagnosis not present

## 2016-11-03 DIAGNOSIS — I69351 Hemiplegia and hemiparesis following cerebral infarction affecting right dominant side: Secondary | ICD-10-CM | POA: Diagnosis not present

## 2016-11-06 DIAGNOSIS — R3981 Functional urinary incontinence: Secondary | ICD-10-CM | POA: Diagnosis not present

## 2016-11-06 DIAGNOSIS — I69351 Hemiplegia and hemiparesis following cerebral infarction affecting right dominant side: Secondary | ICD-10-CM | POA: Diagnosis not present

## 2016-11-06 DIAGNOSIS — R262 Difficulty in walking, not elsewhere classified: Secondary | ICD-10-CM | POA: Diagnosis not present

## 2016-11-06 DIAGNOSIS — M545 Low back pain: Secondary | ICD-10-CM | POA: Diagnosis not present

## 2016-11-06 DIAGNOSIS — R293 Abnormal posture: Secondary | ICD-10-CM | POA: Diagnosis not present

## 2016-11-06 DIAGNOSIS — M6281 Muscle weakness (generalized): Secondary | ICD-10-CM | POA: Diagnosis not present

## 2016-11-07 DIAGNOSIS — R3981 Functional urinary incontinence: Secondary | ICD-10-CM | POA: Diagnosis not present

## 2016-11-07 DIAGNOSIS — M6281 Muscle weakness (generalized): Secondary | ICD-10-CM | POA: Diagnosis not present

## 2016-11-07 DIAGNOSIS — I69351 Hemiplegia and hemiparesis following cerebral infarction affecting right dominant side: Secondary | ICD-10-CM | POA: Diagnosis not present

## 2016-11-07 DIAGNOSIS — R293 Abnormal posture: Secondary | ICD-10-CM | POA: Diagnosis not present

## 2016-11-07 DIAGNOSIS — M545 Low back pain: Secondary | ICD-10-CM | POA: Diagnosis not present

## 2016-11-07 DIAGNOSIS — R262 Difficulty in walking, not elsewhere classified: Secondary | ICD-10-CM | POA: Diagnosis not present

## 2016-11-08 DIAGNOSIS — R262 Difficulty in walking, not elsewhere classified: Secondary | ICD-10-CM | POA: Diagnosis not present

## 2016-11-08 DIAGNOSIS — M6281 Muscle weakness (generalized): Secondary | ICD-10-CM | POA: Diagnosis not present

## 2016-11-08 DIAGNOSIS — R3981 Functional urinary incontinence: Secondary | ICD-10-CM | POA: Diagnosis not present

## 2016-11-08 DIAGNOSIS — I69351 Hemiplegia and hemiparesis following cerebral infarction affecting right dominant side: Secondary | ICD-10-CM | POA: Diagnosis not present

## 2016-11-08 DIAGNOSIS — M545 Low back pain: Secondary | ICD-10-CM | POA: Diagnosis not present

## 2016-11-08 DIAGNOSIS — R293 Abnormal posture: Secondary | ICD-10-CM | POA: Diagnosis not present

## 2016-11-09 DIAGNOSIS — R3981 Functional urinary incontinence: Secondary | ICD-10-CM | POA: Diagnosis not present

## 2016-11-09 DIAGNOSIS — R262 Difficulty in walking, not elsewhere classified: Secondary | ICD-10-CM | POA: Diagnosis not present

## 2016-11-09 DIAGNOSIS — I69351 Hemiplegia and hemiparesis following cerebral infarction affecting right dominant side: Secondary | ICD-10-CM | POA: Diagnosis not present

## 2016-11-09 DIAGNOSIS — M545 Low back pain: Secondary | ICD-10-CM | POA: Diagnosis not present

## 2016-11-09 DIAGNOSIS — M6281 Muscle weakness (generalized): Secondary | ICD-10-CM | POA: Diagnosis not present

## 2016-11-09 DIAGNOSIS — R293 Abnormal posture: Secondary | ICD-10-CM | POA: Diagnosis not present

## 2016-11-10 DIAGNOSIS — R262 Difficulty in walking, not elsewhere classified: Secondary | ICD-10-CM | POA: Diagnosis not present

## 2016-11-10 DIAGNOSIS — R3981 Functional urinary incontinence: Secondary | ICD-10-CM | POA: Diagnosis not present

## 2016-11-10 DIAGNOSIS — R293 Abnormal posture: Secondary | ICD-10-CM | POA: Diagnosis not present

## 2016-11-10 DIAGNOSIS — M545 Low back pain: Secondary | ICD-10-CM | POA: Diagnosis not present

## 2016-11-10 DIAGNOSIS — M6281 Muscle weakness (generalized): Secondary | ICD-10-CM | POA: Diagnosis not present

## 2016-11-10 DIAGNOSIS — I69351 Hemiplegia and hemiparesis following cerebral infarction affecting right dominant side: Secondary | ICD-10-CM | POA: Diagnosis not present

## 2016-11-13 DIAGNOSIS — R293 Abnormal posture: Secondary | ICD-10-CM | POA: Diagnosis not present

## 2016-11-13 DIAGNOSIS — R3981 Functional urinary incontinence: Secondary | ICD-10-CM | POA: Diagnosis not present

## 2016-11-13 DIAGNOSIS — M6281 Muscle weakness (generalized): Secondary | ICD-10-CM | POA: Diagnosis not present

## 2016-11-13 DIAGNOSIS — I69351 Hemiplegia and hemiparesis following cerebral infarction affecting right dominant side: Secondary | ICD-10-CM | POA: Diagnosis not present

## 2016-11-13 DIAGNOSIS — R262 Difficulty in walking, not elsewhere classified: Secondary | ICD-10-CM | POA: Diagnosis not present

## 2016-11-13 DIAGNOSIS — M545 Low back pain: Secondary | ICD-10-CM | POA: Diagnosis not present

## 2016-11-14 DIAGNOSIS — E785 Hyperlipidemia, unspecified: Secondary | ICD-10-CM | POA: Diagnosis not present

## 2016-11-14 DIAGNOSIS — E039 Hypothyroidism, unspecified: Secondary | ICD-10-CM | POA: Diagnosis not present

## 2016-11-14 DIAGNOSIS — I10 Essential (primary) hypertension: Secondary | ICD-10-CM | POA: Diagnosis not present

## 2016-11-14 DIAGNOSIS — Z0000 Encounter for general adult medical examination without abnormal findings: Secondary | ICD-10-CM | POA: Diagnosis not present

## 2016-11-14 DIAGNOSIS — N390 Urinary tract infection, site not specified: Secondary | ICD-10-CM | POA: Diagnosis not present

## 2016-11-14 DIAGNOSIS — E559 Vitamin D deficiency, unspecified: Secondary | ICD-10-CM | POA: Diagnosis not present

## 2016-11-15 DIAGNOSIS — M545 Low back pain: Secondary | ICD-10-CM | POA: Diagnosis not present

## 2016-11-15 DIAGNOSIS — R293 Abnormal posture: Secondary | ICD-10-CM | POA: Diagnosis not present

## 2016-11-15 DIAGNOSIS — R3981 Functional urinary incontinence: Secondary | ICD-10-CM | POA: Diagnosis not present

## 2016-11-15 DIAGNOSIS — I69351 Hemiplegia and hemiparesis following cerebral infarction affecting right dominant side: Secondary | ICD-10-CM | POA: Diagnosis not present

## 2016-11-15 DIAGNOSIS — R262 Difficulty in walking, not elsewhere classified: Secondary | ICD-10-CM | POA: Diagnosis not present

## 2016-11-15 DIAGNOSIS — M6281 Muscle weakness (generalized): Secondary | ICD-10-CM | POA: Diagnosis not present

## 2016-11-16 DIAGNOSIS — R262 Difficulty in walking, not elsewhere classified: Secondary | ICD-10-CM | POA: Diagnosis not present

## 2016-11-16 DIAGNOSIS — R3981 Functional urinary incontinence: Secondary | ICD-10-CM | POA: Diagnosis not present

## 2016-11-16 DIAGNOSIS — M6281 Muscle weakness (generalized): Secondary | ICD-10-CM | POA: Diagnosis not present

## 2016-11-16 DIAGNOSIS — I69351 Hemiplegia and hemiparesis following cerebral infarction affecting right dominant side: Secondary | ICD-10-CM | POA: Diagnosis not present

## 2016-11-16 DIAGNOSIS — M545 Low back pain: Secondary | ICD-10-CM | POA: Diagnosis not present

## 2016-11-16 DIAGNOSIS — R293 Abnormal posture: Secondary | ICD-10-CM | POA: Diagnosis not present

## 2016-11-17 DIAGNOSIS — M6281 Muscle weakness (generalized): Secondary | ICD-10-CM | POA: Diagnosis not present

## 2016-11-17 DIAGNOSIS — I69351 Hemiplegia and hemiparesis following cerebral infarction affecting right dominant side: Secondary | ICD-10-CM | POA: Diagnosis not present

## 2016-11-17 DIAGNOSIS — R262 Difficulty in walking, not elsewhere classified: Secondary | ICD-10-CM | POA: Diagnosis not present

## 2016-11-17 DIAGNOSIS — R293 Abnormal posture: Secondary | ICD-10-CM | POA: Diagnosis not present

## 2016-11-17 DIAGNOSIS — R3981 Functional urinary incontinence: Secondary | ICD-10-CM | POA: Diagnosis not present

## 2016-11-17 DIAGNOSIS — M545 Low back pain: Secondary | ICD-10-CM | POA: Diagnosis not present

## 2016-11-20 DIAGNOSIS — M545 Low back pain: Secondary | ICD-10-CM | POA: Diagnosis not present

## 2016-11-20 DIAGNOSIS — M6281 Muscle weakness (generalized): Secondary | ICD-10-CM | POA: Diagnosis not present

## 2016-11-20 DIAGNOSIS — R262 Difficulty in walking, not elsewhere classified: Secondary | ICD-10-CM | POA: Diagnosis not present

## 2016-11-20 DIAGNOSIS — R3981 Functional urinary incontinence: Secondary | ICD-10-CM | POA: Diagnosis not present

## 2016-11-20 DIAGNOSIS — I69351 Hemiplegia and hemiparesis following cerebral infarction affecting right dominant side: Secondary | ICD-10-CM | POA: Diagnosis not present

## 2016-11-20 DIAGNOSIS — R293 Abnormal posture: Secondary | ICD-10-CM | POA: Diagnosis not present

## 2016-11-21 DIAGNOSIS — M150 Primary generalized (osteo)arthritis: Secondary | ICD-10-CM | POA: Diagnosis not present

## 2016-11-21 DIAGNOSIS — Z23 Encounter for immunization: Secondary | ICD-10-CM | POA: Diagnosis not present

## 2016-11-21 DIAGNOSIS — R079 Chest pain, unspecified: Secondary | ICD-10-CM | POA: Diagnosis not present

## 2016-11-21 DIAGNOSIS — I638 Other cerebral infarction: Secondary | ICD-10-CM | POA: Diagnosis not present

## 2016-11-21 DIAGNOSIS — E785 Hyperlipidemia, unspecified: Secondary | ICD-10-CM | POA: Diagnosis not present

## 2016-11-22 DIAGNOSIS — R3981 Functional urinary incontinence: Secondary | ICD-10-CM | POA: Diagnosis not present

## 2016-11-22 DIAGNOSIS — R293 Abnormal posture: Secondary | ICD-10-CM | POA: Diagnosis not present

## 2016-11-22 DIAGNOSIS — M545 Low back pain: Secondary | ICD-10-CM | POA: Diagnosis not present

## 2016-11-22 DIAGNOSIS — I69351 Hemiplegia and hemiparesis following cerebral infarction affecting right dominant side: Secondary | ICD-10-CM | POA: Diagnosis not present

## 2016-11-22 DIAGNOSIS — R262 Difficulty in walking, not elsewhere classified: Secondary | ICD-10-CM | POA: Diagnosis not present

## 2016-11-22 DIAGNOSIS — M6281 Muscle weakness (generalized): Secondary | ICD-10-CM | POA: Diagnosis not present

## 2016-11-23 DIAGNOSIS — R3981 Functional urinary incontinence: Secondary | ICD-10-CM | POA: Diagnosis not present

## 2016-11-23 DIAGNOSIS — R262 Difficulty in walking, not elsewhere classified: Secondary | ICD-10-CM | POA: Diagnosis not present

## 2016-11-23 DIAGNOSIS — M6281 Muscle weakness (generalized): Secondary | ICD-10-CM | POA: Diagnosis not present

## 2016-11-23 DIAGNOSIS — I69351 Hemiplegia and hemiparesis following cerebral infarction affecting right dominant side: Secondary | ICD-10-CM | POA: Diagnosis not present

## 2016-11-23 DIAGNOSIS — M545 Low back pain: Secondary | ICD-10-CM | POA: Diagnosis not present

## 2016-11-23 DIAGNOSIS — R293 Abnormal posture: Secondary | ICD-10-CM | POA: Diagnosis not present

## 2016-11-27 DIAGNOSIS — R293 Abnormal posture: Secondary | ICD-10-CM | POA: Diagnosis not present

## 2016-11-27 DIAGNOSIS — I69351 Hemiplegia and hemiparesis following cerebral infarction affecting right dominant side: Secondary | ICD-10-CM | POA: Diagnosis not present

## 2016-11-27 DIAGNOSIS — M6281 Muscle weakness (generalized): Secondary | ICD-10-CM | POA: Diagnosis not present

## 2016-11-27 DIAGNOSIS — R262 Difficulty in walking, not elsewhere classified: Secondary | ICD-10-CM | POA: Diagnosis not present

## 2016-11-27 DIAGNOSIS — M545 Low back pain: Secondary | ICD-10-CM | POA: Diagnosis not present

## 2016-11-27 DIAGNOSIS — R3981 Functional urinary incontinence: Secondary | ICD-10-CM | POA: Diagnosis not present

## 2016-11-28 DIAGNOSIS — N760 Acute vaginitis: Secondary | ICD-10-CM | POA: Diagnosis not present

## 2016-11-28 DIAGNOSIS — N8189 Other female genital prolapse: Secondary | ICD-10-CM | POA: Diagnosis not present

## 2016-11-29 ENCOUNTER — Ambulatory Visit (HOSPITAL_COMMUNITY): Payer: Self-pay | Admitting: Psychiatry

## 2016-11-29 DIAGNOSIS — I69351 Hemiplegia and hemiparesis following cerebral infarction affecting right dominant side: Secondary | ICD-10-CM | POA: Diagnosis not present

## 2016-11-29 DIAGNOSIS — R262 Difficulty in walking, not elsewhere classified: Secondary | ICD-10-CM | POA: Diagnosis not present

## 2016-11-29 DIAGNOSIS — M6281 Muscle weakness (generalized): Secondary | ICD-10-CM | POA: Diagnosis not present

## 2016-11-29 DIAGNOSIS — R293 Abnormal posture: Secondary | ICD-10-CM | POA: Diagnosis not present

## 2016-11-29 DIAGNOSIS — R3981 Functional urinary incontinence: Secondary | ICD-10-CM | POA: Diagnosis not present

## 2016-11-29 DIAGNOSIS — M545 Low back pain: Secondary | ICD-10-CM | POA: Diagnosis not present

## 2016-11-30 DIAGNOSIS — R293 Abnormal posture: Secondary | ICD-10-CM | POA: Diagnosis not present

## 2016-11-30 DIAGNOSIS — M6281 Muscle weakness (generalized): Secondary | ICD-10-CM | POA: Diagnosis not present

## 2016-11-30 DIAGNOSIS — R262 Difficulty in walking, not elsewhere classified: Secondary | ICD-10-CM | POA: Diagnosis not present

## 2016-11-30 DIAGNOSIS — R3981 Functional urinary incontinence: Secondary | ICD-10-CM | POA: Diagnosis not present

## 2016-11-30 DIAGNOSIS — M545 Low back pain: Secondary | ICD-10-CM | POA: Diagnosis not present

## 2016-11-30 DIAGNOSIS — I69351 Hemiplegia and hemiparesis following cerebral infarction affecting right dominant side: Secondary | ICD-10-CM | POA: Diagnosis not present

## 2016-12-01 DIAGNOSIS — R3981 Functional urinary incontinence: Secondary | ICD-10-CM | POA: Diagnosis not present

## 2016-12-01 DIAGNOSIS — R293 Abnormal posture: Secondary | ICD-10-CM | POA: Diagnosis not present

## 2016-12-01 DIAGNOSIS — M6281 Muscle weakness (generalized): Secondary | ICD-10-CM | POA: Diagnosis not present

## 2016-12-01 DIAGNOSIS — I69351 Hemiplegia and hemiparesis following cerebral infarction affecting right dominant side: Secondary | ICD-10-CM | POA: Diagnosis not present

## 2016-12-01 DIAGNOSIS — R262 Difficulty in walking, not elsewhere classified: Secondary | ICD-10-CM | POA: Diagnosis not present

## 2016-12-01 DIAGNOSIS — M545 Low back pain: Secondary | ICD-10-CM | POA: Diagnosis not present

## 2016-12-04 DIAGNOSIS — M6281 Muscle weakness (generalized): Secondary | ICD-10-CM | POA: Diagnosis not present

## 2016-12-04 DIAGNOSIS — I69351 Hemiplegia and hemiparesis following cerebral infarction affecting right dominant side: Secondary | ICD-10-CM | POA: Diagnosis not present

## 2016-12-04 DIAGNOSIS — R293 Abnormal posture: Secondary | ICD-10-CM | POA: Diagnosis not present

## 2016-12-04 DIAGNOSIS — R262 Difficulty in walking, not elsewhere classified: Secondary | ICD-10-CM | POA: Diagnosis not present

## 2016-12-04 DIAGNOSIS — M545 Low back pain: Secondary | ICD-10-CM | POA: Diagnosis not present

## 2016-12-04 DIAGNOSIS — R3981 Functional urinary incontinence: Secondary | ICD-10-CM | POA: Diagnosis not present

## 2016-12-05 DIAGNOSIS — R3981 Functional urinary incontinence: Secondary | ICD-10-CM | POA: Diagnosis not present

## 2016-12-05 DIAGNOSIS — R262 Difficulty in walking, not elsewhere classified: Secondary | ICD-10-CM | POA: Diagnosis not present

## 2016-12-05 DIAGNOSIS — M6281 Muscle weakness (generalized): Secondary | ICD-10-CM | POA: Diagnosis not present

## 2016-12-05 DIAGNOSIS — M545 Low back pain: Secondary | ICD-10-CM | POA: Diagnosis not present

## 2016-12-05 DIAGNOSIS — I69351 Hemiplegia and hemiparesis following cerebral infarction affecting right dominant side: Secondary | ICD-10-CM | POA: Diagnosis not present

## 2016-12-05 DIAGNOSIS — R293 Abnormal posture: Secondary | ICD-10-CM | POA: Diagnosis not present

## 2016-12-06 DIAGNOSIS — M545 Low back pain: Secondary | ICD-10-CM | POA: Diagnosis not present

## 2016-12-06 DIAGNOSIS — R293 Abnormal posture: Secondary | ICD-10-CM | POA: Diagnosis not present

## 2016-12-06 DIAGNOSIS — R262 Difficulty in walking, not elsewhere classified: Secondary | ICD-10-CM | POA: Diagnosis not present

## 2016-12-06 DIAGNOSIS — M6281 Muscle weakness (generalized): Secondary | ICD-10-CM | POA: Diagnosis not present

## 2016-12-06 DIAGNOSIS — I69351 Hemiplegia and hemiparesis following cerebral infarction affecting right dominant side: Secondary | ICD-10-CM | POA: Diagnosis not present

## 2016-12-06 DIAGNOSIS — R3981 Functional urinary incontinence: Secondary | ICD-10-CM | POA: Diagnosis not present

## 2016-12-07 DIAGNOSIS — I69351 Hemiplegia and hemiparesis following cerebral infarction affecting right dominant side: Secondary | ICD-10-CM | POA: Diagnosis not present

## 2016-12-07 DIAGNOSIS — M6281 Muscle weakness (generalized): Secondary | ICD-10-CM | POA: Diagnosis not present

## 2016-12-07 DIAGNOSIS — R262 Difficulty in walking, not elsewhere classified: Secondary | ICD-10-CM | POA: Diagnosis not present

## 2016-12-07 DIAGNOSIS — R3981 Functional urinary incontinence: Secondary | ICD-10-CM | POA: Diagnosis not present

## 2016-12-07 DIAGNOSIS — M545 Low back pain: Secondary | ICD-10-CM | POA: Diagnosis not present

## 2016-12-07 DIAGNOSIS — R293 Abnormal posture: Secondary | ICD-10-CM | POA: Diagnosis not present

## 2016-12-08 DIAGNOSIS — R293 Abnormal posture: Secondary | ICD-10-CM | POA: Diagnosis not present

## 2016-12-08 DIAGNOSIS — M545 Low back pain: Secondary | ICD-10-CM | POA: Diagnosis not present

## 2016-12-08 DIAGNOSIS — R262 Difficulty in walking, not elsewhere classified: Secondary | ICD-10-CM | POA: Diagnosis not present

## 2016-12-08 DIAGNOSIS — R3981 Functional urinary incontinence: Secondary | ICD-10-CM | POA: Diagnosis not present

## 2016-12-08 DIAGNOSIS — I69351 Hemiplegia and hemiparesis following cerebral infarction affecting right dominant side: Secondary | ICD-10-CM | POA: Diagnosis not present

## 2016-12-08 DIAGNOSIS — M6281 Muscle weakness (generalized): Secondary | ICD-10-CM | POA: Diagnosis not present

## 2016-12-10 DIAGNOSIS — I69351 Hemiplegia and hemiparesis following cerebral infarction affecting right dominant side: Secondary | ICD-10-CM | POA: Diagnosis not present

## 2016-12-10 DIAGNOSIS — M545 Low back pain: Secondary | ICD-10-CM | POA: Diagnosis not present

## 2016-12-10 DIAGNOSIS — M6281 Muscle weakness (generalized): Secondary | ICD-10-CM | POA: Diagnosis not present

## 2016-12-10 DIAGNOSIS — R293 Abnormal posture: Secondary | ICD-10-CM | POA: Diagnosis not present

## 2016-12-10 DIAGNOSIS — R3981 Functional urinary incontinence: Secondary | ICD-10-CM | POA: Diagnosis not present

## 2016-12-10 DIAGNOSIS — R262 Difficulty in walking, not elsewhere classified: Secondary | ICD-10-CM | POA: Diagnosis not present

## 2016-12-11 DIAGNOSIS — R3981 Functional urinary incontinence: Secondary | ICD-10-CM | POA: Diagnosis not present

## 2016-12-11 DIAGNOSIS — R262 Difficulty in walking, not elsewhere classified: Secondary | ICD-10-CM | POA: Diagnosis not present

## 2016-12-11 DIAGNOSIS — R293 Abnormal posture: Secondary | ICD-10-CM | POA: Diagnosis not present

## 2016-12-11 DIAGNOSIS — M545 Low back pain: Secondary | ICD-10-CM | POA: Diagnosis not present

## 2016-12-11 DIAGNOSIS — M6281 Muscle weakness (generalized): Secondary | ICD-10-CM | POA: Diagnosis not present

## 2016-12-11 DIAGNOSIS — I69351 Hemiplegia and hemiparesis following cerebral infarction affecting right dominant side: Secondary | ICD-10-CM | POA: Diagnosis not present

## 2016-12-12 DIAGNOSIS — R293 Abnormal posture: Secondary | ICD-10-CM | POA: Diagnosis not present

## 2016-12-12 DIAGNOSIS — R262 Difficulty in walking, not elsewhere classified: Secondary | ICD-10-CM | POA: Diagnosis not present

## 2016-12-12 DIAGNOSIS — M6281 Muscle weakness (generalized): Secondary | ICD-10-CM | POA: Diagnosis not present

## 2016-12-12 DIAGNOSIS — I69351 Hemiplegia and hemiparesis following cerebral infarction affecting right dominant side: Secondary | ICD-10-CM | POA: Diagnosis not present

## 2016-12-12 DIAGNOSIS — R3981 Functional urinary incontinence: Secondary | ICD-10-CM | POA: Diagnosis not present

## 2016-12-12 DIAGNOSIS — M545 Low back pain: Secondary | ICD-10-CM | POA: Diagnosis not present

## 2016-12-14 DIAGNOSIS — M6281 Muscle weakness (generalized): Secondary | ICD-10-CM | POA: Diagnosis not present

## 2016-12-14 DIAGNOSIS — M545 Low back pain: Secondary | ICD-10-CM | POA: Diagnosis not present

## 2016-12-14 DIAGNOSIS — R3981 Functional urinary incontinence: Secondary | ICD-10-CM | POA: Diagnosis not present

## 2016-12-14 DIAGNOSIS — R262 Difficulty in walking, not elsewhere classified: Secondary | ICD-10-CM | POA: Diagnosis not present

## 2016-12-14 DIAGNOSIS — R293 Abnormal posture: Secondary | ICD-10-CM | POA: Diagnosis not present

## 2016-12-14 DIAGNOSIS — I69351 Hemiplegia and hemiparesis following cerebral infarction affecting right dominant side: Secondary | ICD-10-CM | POA: Diagnosis not present

## 2016-12-15 DIAGNOSIS — M6281 Muscle weakness (generalized): Secondary | ICD-10-CM | POA: Diagnosis not present

## 2016-12-15 DIAGNOSIS — R3981 Functional urinary incontinence: Secondary | ICD-10-CM | POA: Diagnosis not present

## 2016-12-15 DIAGNOSIS — I69351 Hemiplegia and hemiparesis following cerebral infarction affecting right dominant side: Secondary | ICD-10-CM | POA: Diagnosis not present

## 2016-12-15 DIAGNOSIS — M545 Low back pain: Secondary | ICD-10-CM | POA: Diagnosis not present

## 2016-12-15 DIAGNOSIS — R293 Abnormal posture: Secondary | ICD-10-CM | POA: Diagnosis not present

## 2016-12-15 DIAGNOSIS — R262 Difficulty in walking, not elsewhere classified: Secondary | ICD-10-CM | POA: Diagnosis not present

## 2016-12-16 DIAGNOSIS — R262 Difficulty in walking, not elsewhere classified: Secondary | ICD-10-CM | POA: Diagnosis not present

## 2016-12-16 DIAGNOSIS — M6281 Muscle weakness (generalized): Secondary | ICD-10-CM | POA: Diagnosis not present

## 2016-12-16 DIAGNOSIS — M545 Low back pain: Secondary | ICD-10-CM | POA: Diagnosis not present

## 2016-12-16 DIAGNOSIS — R3981 Functional urinary incontinence: Secondary | ICD-10-CM | POA: Diagnosis not present

## 2016-12-16 DIAGNOSIS — R293 Abnormal posture: Secondary | ICD-10-CM | POA: Diagnosis not present

## 2016-12-16 DIAGNOSIS — I69351 Hemiplegia and hemiparesis following cerebral infarction affecting right dominant side: Secondary | ICD-10-CM | POA: Diagnosis not present

## 2016-12-19 DIAGNOSIS — I69351 Hemiplegia and hemiparesis following cerebral infarction affecting right dominant side: Secondary | ICD-10-CM | POA: Diagnosis not present

## 2016-12-19 DIAGNOSIS — M6281 Muscle weakness (generalized): Secondary | ICD-10-CM | POA: Diagnosis not present

## 2016-12-19 DIAGNOSIS — M545 Low back pain: Secondary | ICD-10-CM | POA: Diagnosis not present

## 2016-12-19 DIAGNOSIS — R293 Abnormal posture: Secondary | ICD-10-CM | POA: Diagnosis not present

## 2016-12-19 DIAGNOSIS — R262 Difficulty in walking, not elsewhere classified: Secondary | ICD-10-CM | POA: Diagnosis not present

## 2016-12-19 DIAGNOSIS — R3981 Functional urinary incontinence: Secondary | ICD-10-CM | POA: Diagnosis not present

## 2016-12-20 DIAGNOSIS — R262 Difficulty in walking, not elsewhere classified: Secondary | ICD-10-CM | POA: Diagnosis not present

## 2016-12-20 DIAGNOSIS — M545 Low back pain: Secondary | ICD-10-CM | POA: Diagnosis not present

## 2016-12-20 DIAGNOSIS — R3981 Functional urinary incontinence: Secondary | ICD-10-CM | POA: Diagnosis not present

## 2016-12-20 DIAGNOSIS — R293 Abnormal posture: Secondary | ICD-10-CM | POA: Diagnosis not present

## 2016-12-20 DIAGNOSIS — I69351 Hemiplegia and hemiparesis following cerebral infarction affecting right dominant side: Secondary | ICD-10-CM | POA: Diagnosis not present

## 2016-12-20 DIAGNOSIS — M6281 Muscle weakness (generalized): Secondary | ICD-10-CM | POA: Diagnosis not present

## 2016-12-21 DIAGNOSIS — R262 Difficulty in walking, not elsewhere classified: Secondary | ICD-10-CM | POA: Diagnosis not present

## 2016-12-21 DIAGNOSIS — R293 Abnormal posture: Secondary | ICD-10-CM | POA: Diagnosis not present

## 2016-12-21 DIAGNOSIS — I69351 Hemiplegia and hemiparesis following cerebral infarction affecting right dominant side: Secondary | ICD-10-CM | POA: Diagnosis not present

## 2016-12-21 DIAGNOSIS — M545 Low back pain: Secondary | ICD-10-CM | POA: Diagnosis not present

## 2016-12-21 DIAGNOSIS — M6281 Muscle weakness (generalized): Secondary | ICD-10-CM | POA: Diagnosis not present

## 2016-12-21 DIAGNOSIS — R3981 Functional urinary incontinence: Secondary | ICD-10-CM | POA: Diagnosis not present

## 2016-12-22 DIAGNOSIS — R3981 Functional urinary incontinence: Secondary | ICD-10-CM | POA: Diagnosis not present

## 2016-12-22 DIAGNOSIS — M6281 Muscle weakness (generalized): Secondary | ICD-10-CM | POA: Diagnosis not present

## 2016-12-22 DIAGNOSIS — R293 Abnormal posture: Secondary | ICD-10-CM | POA: Diagnosis not present

## 2016-12-22 DIAGNOSIS — R262 Difficulty in walking, not elsewhere classified: Secondary | ICD-10-CM | POA: Diagnosis not present

## 2016-12-22 DIAGNOSIS — M545 Low back pain: Secondary | ICD-10-CM | POA: Diagnosis not present

## 2016-12-22 DIAGNOSIS — I69351 Hemiplegia and hemiparesis following cerebral infarction affecting right dominant side: Secondary | ICD-10-CM | POA: Diagnosis not present

## 2016-12-25 DIAGNOSIS — I69351 Hemiplegia and hemiparesis following cerebral infarction affecting right dominant side: Secondary | ICD-10-CM | POA: Diagnosis not present

## 2016-12-25 DIAGNOSIS — M545 Low back pain: Secondary | ICD-10-CM | POA: Diagnosis not present

## 2016-12-25 DIAGNOSIS — R262 Difficulty in walking, not elsewhere classified: Secondary | ICD-10-CM | POA: Diagnosis not present

## 2016-12-25 DIAGNOSIS — M6281 Muscle weakness (generalized): Secondary | ICD-10-CM | POA: Diagnosis not present

## 2016-12-25 DIAGNOSIS — R3981 Functional urinary incontinence: Secondary | ICD-10-CM | POA: Diagnosis not present

## 2016-12-25 DIAGNOSIS — R293 Abnormal posture: Secondary | ICD-10-CM | POA: Diagnosis not present

## 2016-12-26 DIAGNOSIS — M6281 Muscle weakness (generalized): Secondary | ICD-10-CM | POA: Diagnosis not present

## 2016-12-26 DIAGNOSIS — I69351 Hemiplegia and hemiparesis following cerebral infarction affecting right dominant side: Secondary | ICD-10-CM | POA: Diagnosis not present

## 2016-12-26 DIAGNOSIS — R3981 Functional urinary incontinence: Secondary | ICD-10-CM | POA: Diagnosis not present

## 2016-12-26 DIAGNOSIS — R262 Difficulty in walking, not elsewhere classified: Secondary | ICD-10-CM | POA: Diagnosis not present

## 2016-12-26 DIAGNOSIS — R293 Abnormal posture: Secondary | ICD-10-CM | POA: Diagnosis not present

## 2016-12-26 DIAGNOSIS — M545 Low back pain: Secondary | ICD-10-CM | POA: Diagnosis not present

## 2016-12-27 DIAGNOSIS — N8189 Other female genital prolapse: Secondary | ICD-10-CM | POA: Diagnosis not present

## 2016-12-27 DIAGNOSIS — R3911 Hesitancy of micturition: Secondary | ICD-10-CM | POA: Diagnosis not present

## 2016-12-27 DIAGNOSIS — N760 Acute vaginitis: Secondary | ICD-10-CM | POA: Diagnosis not present

## 2016-12-28 DIAGNOSIS — M6281 Muscle weakness (generalized): Secondary | ICD-10-CM | POA: Diagnosis not present

## 2016-12-28 DIAGNOSIS — R262 Difficulty in walking, not elsewhere classified: Secondary | ICD-10-CM | POA: Diagnosis not present

## 2016-12-28 DIAGNOSIS — R293 Abnormal posture: Secondary | ICD-10-CM | POA: Diagnosis not present

## 2016-12-28 DIAGNOSIS — M545 Low back pain: Secondary | ICD-10-CM | POA: Diagnosis not present

## 2016-12-28 DIAGNOSIS — R3981 Functional urinary incontinence: Secondary | ICD-10-CM | POA: Diagnosis not present

## 2016-12-28 DIAGNOSIS — I69351 Hemiplegia and hemiparesis following cerebral infarction affecting right dominant side: Secondary | ICD-10-CM | POA: Diagnosis not present

## 2017-01-01 DIAGNOSIS — M6281 Muscle weakness (generalized): Secondary | ICD-10-CM | POA: Diagnosis not present

## 2017-01-01 DIAGNOSIS — I69351 Hemiplegia and hemiparesis following cerebral infarction affecting right dominant side: Secondary | ICD-10-CM | POA: Diagnosis not present

## 2017-01-01 DIAGNOSIS — R3981 Functional urinary incontinence: Secondary | ICD-10-CM | POA: Diagnosis not present

## 2017-01-01 DIAGNOSIS — R262 Difficulty in walking, not elsewhere classified: Secondary | ICD-10-CM | POA: Diagnosis not present

## 2017-01-01 DIAGNOSIS — M545 Low back pain: Secondary | ICD-10-CM | POA: Diagnosis not present

## 2017-01-01 DIAGNOSIS — R293 Abnormal posture: Secondary | ICD-10-CM | POA: Diagnosis not present

## 2017-01-02 DIAGNOSIS — R3981 Functional urinary incontinence: Secondary | ICD-10-CM | POA: Diagnosis not present

## 2017-01-02 DIAGNOSIS — R262 Difficulty in walking, not elsewhere classified: Secondary | ICD-10-CM | POA: Diagnosis not present

## 2017-01-02 DIAGNOSIS — R293 Abnormal posture: Secondary | ICD-10-CM | POA: Diagnosis not present

## 2017-01-02 DIAGNOSIS — I69351 Hemiplegia and hemiparesis following cerebral infarction affecting right dominant side: Secondary | ICD-10-CM | POA: Diagnosis not present

## 2017-01-02 DIAGNOSIS — M545 Low back pain: Secondary | ICD-10-CM | POA: Diagnosis not present

## 2017-01-02 DIAGNOSIS — M6281 Muscle weakness (generalized): Secondary | ICD-10-CM | POA: Diagnosis not present

## 2017-01-03 DIAGNOSIS — R293 Abnormal posture: Secondary | ICD-10-CM | POA: Diagnosis not present

## 2017-01-03 DIAGNOSIS — R262 Difficulty in walking, not elsewhere classified: Secondary | ICD-10-CM | POA: Diagnosis not present

## 2017-01-03 DIAGNOSIS — I69351 Hemiplegia and hemiparesis following cerebral infarction affecting right dominant side: Secondary | ICD-10-CM | POA: Diagnosis not present

## 2017-01-03 DIAGNOSIS — R3981 Functional urinary incontinence: Secondary | ICD-10-CM | POA: Diagnosis not present

## 2017-01-03 DIAGNOSIS — M545 Low back pain: Secondary | ICD-10-CM | POA: Diagnosis not present

## 2017-01-03 DIAGNOSIS — M6281 Muscle weakness (generalized): Secondary | ICD-10-CM | POA: Diagnosis not present

## 2017-01-04 DIAGNOSIS — R262 Difficulty in walking, not elsewhere classified: Secondary | ICD-10-CM | POA: Diagnosis not present

## 2017-01-04 DIAGNOSIS — I69351 Hemiplegia and hemiparesis following cerebral infarction affecting right dominant side: Secondary | ICD-10-CM | POA: Diagnosis not present

## 2017-01-04 DIAGNOSIS — M6281 Muscle weakness (generalized): Secondary | ICD-10-CM | POA: Diagnosis not present

## 2017-01-04 DIAGNOSIS — R3981 Functional urinary incontinence: Secondary | ICD-10-CM | POA: Diagnosis not present

## 2017-01-04 DIAGNOSIS — R293 Abnormal posture: Secondary | ICD-10-CM | POA: Diagnosis not present

## 2017-01-04 DIAGNOSIS — M545 Low back pain: Secondary | ICD-10-CM | POA: Diagnosis not present

## 2017-01-08 DIAGNOSIS — R3981 Functional urinary incontinence: Secondary | ICD-10-CM | POA: Diagnosis not present

## 2017-01-08 DIAGNOSIS — I69351 Hemiplegia and hemiparesis following cerebral infarction affecting right dominant side: Secondary | ICD-10-CM | POA: Diagnosis not present

## 2017-01-08 DIAGNOSIS — M545 Low back pain: Secondary | ICD-10-CM | POA: Diagnosis not present

## 2017-01-08 DIAGNOSIS — R262 Difficulty in walking, not elsewhere classified: Secondary | ICD-10-CM | POA: Diagnosis not present

## 2017-01-08 DIAGNOSIS — R293 Abnormal posture: Secondary | ICD-10-CM | POA: Diagnosis not present

## 2017-01-08 DIAGNOSIS — M6281 Muscle weakness (generalized): Secondary | ICD-10-CM | POA: Diagnosis not present

## 2017-01-09 DIAGNOSIS — I69351 Hemiplegia and hemiparesis following cerebral infarction affecting right dominant side: Secondary | ICD-10-CM | POA: Diagnosis not present

## 2017-01-09 DIAGNOSIS — M545 Low back pain: Secondary | ICD-10-CM | POA: Diagnosis not present

## 2017-01-09 DIAGNOSIS — M6281 Muscle weakness (generalized): Secondary | ICD-10-CM | POA: Diagnosis not present

## 2017-01-09 DIAGNOSIS — R3981 Functional urinary incontinence: Secondary | ICD-10-CM | POA: Diagnosis not present

## 2017-01-09 DIAGNOSIS — R293 Abnormal posture: Secondary | ICD-10-CM | POA: Diagnosis not present

## 2017-01-09 DIAGNOSIS — R262 Difficulty in walking, not elsewhere classified: Secondary | ICD-10-CM | POA: Diagnosis not present

## 2017-01-10 DIAGNOSIS — M545 Low back pain: Secondary | ICD-10-CM | POA: Diagnosis not present

## 2017-01-10 DIAGNOSIS — R293 Abnormal posture: Secondary | ICD-10-CM | POA: Diagnosis not present

## 2017-01-10 DIAGNOSIS — I69351 Hemiplegia and hemiparesis following cerebral infarction affecting right dominant side: Secondary | ICD-10-CM | POA: Diagnosis not present

## 2017-01-10 DIAGNOSIS — M6281 Muscle weakness (generalized): Secondary | ICD-10-CM | POA: Diagnosis not present

## 2017-01-10 DIAGNOSIS — R262 Difficulty in walking, not elsewhere classified: Secondary | ICD-10-CM | POA: Diagnosis not present

## 2017-01-10 DIAGNOSIS — R3981 Functional urinary incontinence: Secondary | ICD-10-CM | POA: Diagnosis not present

## 2017-01-11 DIAGNOSIS — R262 Difficulty in walking, not elsewhere classified: Secondary | ICD-10-CM | POA: Diagnosis not present

## 2017-01-11 DIAGNOSIS — R293 Abnormal posture: Secondary | ICD-10-CM | POA: Diagnosis not present

## 2017-01-11 DIAGNOSIS — R3981 Functional urinary incontinence: Secondary | ICD-10-CM | POA: Diagnosis not present

## 2017-01-11 DIAGNOSIS — M545 Low back pain: Secondary | ICD-10-CM | POA: Diagnosis not present

## 2017-01-11 DIAGNOSIS — I69351 Hemiplegia and hemiparesis following cerebral infarction affecting right dominant side: Secondary | ICD-10-CM | POA: Diagnosis not present

## 2017-01-11 DIAGNOSIS — M6281 Muscle weakness (generalized): Secondary | ICD-10-CM | POA: Diagnosis not present

## 2017-01-12 DIAGNOSIS — R293 Abnormal posture: Secondary | ICD-10-CM | POA: Diagnosis not present

## 2017-01-12 DIAGNOSIS — R3981 Functional urinary incontinence: Secondary | ICD-10-CM | POA: Diagnosis not present

## 2017-01-12 DIAGNOSIS — I69351 Hemiplegia and hemiparesis following cerebral infarction affecting right dominant side: Secondary | ICD-10-CM | POA: Diagnosis not present

## 2017-01-12 DIAGNOSIS — M6281 Muscle weakness (generalized): Secondary | ICD-10-CM | POA: Diagnosis not present

## 2017-01-12 DIAGNOSIS — M545 Low back pain: Secondary | ICD-10-CM | POA: Diagnosis not present

## 2017-01-12 DIAGNOSIS — R262 Difficulty in walking, not elsewhere classified: Secondary | ICD-10-CM | POA: Diagnosis not present

## 2017-01-15 DIAGNOSIS — R3981 Functional urinary incontinence: Secondary | ICD-10-CM | POA: Diagnosis not present

## 2017-01-15 DIAGNOSIS — M6281 Muscle weakness (generalized): Secondary | ICD-10-CM | POA: Diagnosis not present

## 2017-01-15 DIAGNOSIS — M545 Low back pain: Secondary | ICD-10-CM | POA: Diagnosis not present

## 2017-01-15 DIAGNOSIS — I69351 Hemiplegia and hemiparesis following cerebral infarction affecting right dominant side: Secondary | ICD-10-CM | POA: Diagnosis not present

## 2017-01-15 DIAGNOSIS — R262 Difficulty in walking, not elsewhere classified: Secondary | ICD-10-CM | POA: Diagnosis not present

## 2017-01-15 DIAGNOSIS — R293 Abnormal posture: Secondary | ICD-10-CM | POA: Diagnosis not present

## 2017-01-16 ENCOUNTER — Ambulatory Visit: Payer: Medicare Other | Admitting: Adult Health

## 2017-01-16 DIAGNOSIS — M545 Low back pain: Secondary | ICD-10-CM | POA: Diagnosis not present

## 2017-01-16 DIAGNOSIS — R293 Abnormal posture: Secondary | ICD-10-CM | POA: Diagnosis not present

## 2017-01-16 DIAGNOSIS — R3981 Functional urinary incontinence: Secondary | ICD-10-CM | POA: Diagnosis not present

## 2017-01-16 DIAGNOSIS — M6281 Muscle weakness (generalized): Secondary | ICD-10-CM | POA: Diagnosis not present

## 2017-01-16 DIAGNOSIS — R262 Difficulty in walking, not elsewhere classified: Secondary | ICD-10-CM | POA: Diagnosis not present

## 2017-01-16 DIAGNOSIS — I69351 Hemiplegia and hemiparesis following cerebral infarction affecting right dominant side: Secondary | ICD-10-CM | POA: Diagnosis not present

## 2017-01-18 DIAGNOSIS — M6281 Muscle weakness (generalized): Secondary | ICD-10-CM | POA: Diagnosis not present

## 2017-01-18 DIAGNOSIS — R293 Abnormal posture: Secondary | ICD-10-CM | POA: Diagnosis not present

## 2017-01-18 DIAGNOSIS — R262 Difficulty in walking, not elsewhere classified: Secondary | ICD-10-CM | POA: Diagnosis not present

## 2017-01-18 DIAGNOSIS — R3981 Functional urinary incontinence: Secondary | ICD-10-CM | POA: Diagnosis not present

## 2017-01-18 DIAGNOSIS — M545 Low back pain: Secondary | ICD-10-CM | POA: Diagnosis not present

## 2017-01-18 DIAGNOSIS — I69351 Hemiplegia and hemiparesis following cerebral infarction affecting right dominant side: Secondary | ICD-10-CM | POA: Diagnosis not present

## 2017-01-22 DIAGNOSIS — M545 Low back pain: Secondary | ICD-10-CM | POA: Diagnosis not present

## 2017-01-22 DIAGNOSIS — M6281 Muscle weakness (generalized): Secondary | ICD-10-CM | POA: Diagnosis not present

## 2017-01-22 DIAGNOSIS — R262 Difficulty in walking, not elsewhere classified: Secondary | ICD-10-CM | POA: Diagnosis not present

## 2017-01-22 DIAGNOSIS — R293 Abnormal posture: Secondary | ICD-10-CM | POA: Diagnosis not present

## 2017-01-22 DIAGNOSIS — R3981 Functional urinary incontinence: Secondary | ICD-10-CM | POA: Diagnosis not present

## 2017-01-22 DIAGNOSIS — I69351 Hemiplegia and hemiparesis following cerebral infarction affecting right dominant side: Secondary | ICD-10-CM | POA: Diagnosis not present

## 2017-01-23 DIAGNOSIS — I69351 Hemiplegia and hemiparesis following cerebral infarction affecting right dominant side: Secondary | ICD-10-CM | POA: Diagnosis not present

## 2017-01-23 DIAGNOSIS — M6281 Muscle weakness (generalized): Secondary | ICD-10-CM | POA: Diagnosis not present

## 2017-01-23 DIAGNOSIS — R3981 Functional urinary incontinence: Secondary | ICD-10-CM | POA: Diagnosis not present

## 2017-01-23 DIAGNOSIS — R262 Difficulty in walking, not elsewhere classified: Secondary | ICD-10-CM | POA: Diagnosis not present

## 2017-01-23 DIAGNOSIS — R293 Abnormal posture: Secondary | ICD-10-CM | POA: Diagnosis not present

## 2017-01-23 DIAGNOSIS — M545 Low back pain: Secondary | ICD-10-CM | POA: Diagnosis not present

## 2017-01-24 DIAGNOSIS — I69351 Hemiplegia and hemiparesis following cerebral infarction affecting right dominant side: Secondary | ICD-10-CM | POA: Diagnosis not present

## 2017-01-24 DIAGNOSIS — R293 Abnormal posture: Secondary | ICD-10-CM | POA: Diagnosis not present

## 2017-01-24 DIAGNOSIS — R262 Difficulty in walking, not elsewhere classified: Secondary | ICD-10-CM | POA: Diagnosis not present

## 2017-01-24 DIAGNOSIS — R3981 Functional urinary incontinence: Secondary | ICD-10-CM | POA: Diagnosis not present

## 2017-01-24 DIAGNOSIS — M6281 Muscle weakness (generalized): Secondary | ICD-10-CM | POA: Diagnosis not present

## 2017-01-24 DIAGNOSIS — M545 Low back pain: Secondary | ICD-10-CM | POA: Diagnosis not present

## 2017-01-25 DIAGNOSIS — M6281 Muscle weakness (generalized): Secondary | ICD-10-CM | POA: Diagnosis not present

## 2017-01-25 DIAGNOSIS — I69351 Hemiplegia and hemiparesis following cerebral infarction affecting right dominant side: Secondary | ICD-10-CM | POA: Diagnosis not present

## 2017-01-25 DIAGNOSIS — M545 Low back pain: Secondary | ICD-10-CM | POA: Diagnosis not present

## 2017-01-25 DIAGNOSIS — R262 Difficulty in walking, not elsewhere classified: Secondary | ICD-10-CM | POA: Diagnosis not present

## 2017-01-25 DIAGNOSIS — R3981 Functional urinary incontinence: Secondary | ICD-10-CM | POA: Diagnosis not present

## 2017-01-25 DIAGNOSIS — R293 Abnormal posture: Secondary | ICD-10-CM | POA: Diagnosis not present

## 2017-01-26 DIAGNOSIS — M545 Low back pain: Secondary | ICD-10-CM | POA: Diagnosis not present

## 2017-01-26 DIAGNOSIS — R3981 Functional urinary incontinence: Secondary | ICD-10-CM | POA: Diagnosis not present

## 2017-01-26 DIAGNOSIS — I69351 Hemiplegia and hemiparesis following cerebral infarction affecting right dominant side: Secondary | ICD-10-CM | POA: Diagnosis not present

## 2017-01-26 DIAGNOSIS — M6281 Muscle weakness (generalized): Secondary | ICD-10-CM | POA: Diagnosis not present

## 2017-01-26 DIAGNOSIS — R262 Difficulty in walking, not elsewhere classified: Secondary | ICD-10-CM | POA: Diagnosis not present

## 2017-01-26 DIAGNOSIS — R293 Abnormal posture: Secondary | ICD-10-CM | POA: Diagnosis not present

## 2017-01-29 DIAGNOSIS — M6281 Muscle weakness (generalized): Secondary | ICD-10-CM | POA: Diagnosis not present

## 2017-01-29 DIAGNOSIS — R293 Abnormal posture: Secondary | ICD-10-CM | POA: Diagnosis not present

## 2017-01-29 DIAGNOSIS — M545 Low back pain: Secondary | ICD-10-CM | POA: Diagnosis not present

## 2017-01-29 DIAGNOSIS — R3981 Functional urinary incontinence: Secondary | ICD-10-CM | POA: Diagnosis not present

## 2017-01-29 DIAGNOSIS — R262 Difficulty in walking, not elsewhere classified: Secondary | ICD-10-CM | POA: Diagnosis not present

## 2017-01-29 DIAGNOSIS — I69351 Hemiplegia and hemiparesis following cerebral infarction affecting right dominant side: Secondary | ICD-10-CM | POA: Diagnosis not present

## 2017-01-30 DIAGNOSIS — M6281 Muscle weakness (generalized): Secondary | ICD-10-CM | POA: Diagnosis not present

## 2017-01-30 DIAGNOSIS — R293 Abnormal posture: Secondary | ICD-10-CM | POA: Diagnosis not present

## 2017-01-30 DIAGNOSIS — M545 Low back pain: Secondary | ICD-10-CM | POA: Diagnosis not present

## 2017-01-30 DIAGNOSIS — R3981 Functional urinary incontinence: Secondary | ICD-10-CM | POA: Diagnosis not present

## 2017-01-30 DIAGNOSIS — R262 Difficulty in walking, not elsewhere classified: Secondary | ICD-10-CM | POA: Diagnosis not present

## 2017-01-30 DIAGNOSIS — I69351 Hemiplegia and hemiparesis following cerebral infarction affecting right dominant side: Secondary | ICD-10-CM | POA: Diagnosis not present

## 2017-01-31 DIAGNOSIS — N760 Acute vaginitis: Secondary | ICD-10-CM | POA: Diagnosis not present

## 2017-01-31 DIAGNOSIS — N8189 Other female genital prolapse: Secondary | ICD-10-CM | POA: Diagnosis not present

## 2017-02-01 DIAGNOSIS — R293 Abnormal posture: Secondary | ICD-10-CM | POA: Diagnosis not present

## 2017-02-01 DIAGNOSIS — M6281 Muscle weakness (generalized): Secondary | ICD-10-CM | POA: Diagnosis not present

## 2017-02-01 DIAGNOSIS — R262 Difficulty in walking, not elsewhere classified: Secondary | ICD-10-CM | POA: Diagnosis not present

## 2017-02-01 DIAGNOSIS — M545 Low back pain: Secondary | ICD-10-CM | POA: Diagnosis not present

## 2017-02-01 DIAGNOSIS — I69351 Hemiplegia and hemiparesis following cerebral infarction affecting right dominant side: Secondary | ICD-10-CM | POA: Diagnosis not present

## 2017-02-01 DIAGNOSIS — R3981 Functional urinary incontinence: Secondary | ICD-10-CM | POA: Diagnosis not present

## 2017-02-02 DIAGNOSIS — R3981 Functional urinary incontinence: Secondary | ICD-10-CM | POA: Diagnosis not present

## 2017-02-02 DIAGNOSIS — R293 Abnormal posture: Secondary | ICD-10-CM | POA: Diagnosis not present

## 2017-02-02 DIAGNOSIS — R262 Difficulty in walking, not elsewhere classified: Secondary | ICD-10-CM | POA: Diagnosis not present

## 2017-02-02 DIAGNOSIS — M545 Low back pain: Secondary | ICD-10-CM | POA: Diagnosis not present

## 2017-02-02 DIAGNOSIS — I69351 Hemiplegia and hemiparesis following cerebral infarction affecting right dominant side: Secondary | ICD-10-CM | POA: Diagnosis not present

## 2017-02-02 DIAGNOSIS — M6281 Muscle weakness (generalized): Secondary | ICD-10-CM | POA: Diagnosis not present

## 2017-02-05 DIAGNOSIS — M6281 Muscle weakness (generalized): Secondary | ICD-10-CM | POA: Diagnosis not present

## 2017-02-05 DIAGNOSIS — R293 Abnormal posture: Secondary | ICD-10-CM | POA: Diagnosis not present

## 2017-02-05 DIAGNOSIS — M545 Low back pain: Secondary | ICD-10-CM | POA: Diagnosis not present

## 2017-02-05 DIAGNOSIS — R262 Difficulty in walking, not elsewhere classified: Secondary | ICD-10-CM | POA: Diagnosis not present

## 2017-02-05 DIAGNOSIS — I69351 Hemiplegia and hemiparesis following cerebral infarction affecting right dominant side: Secondary | ICD-10-CM | POA: Diagnosis not present

## 2017-02-05 DIAGNOSIS — R3981 Functional urinary incontinence: Secondary | ICD-10-CM | POA: Diagnosis not present

## 2017-02-06 DIAGNOSIS — R3981 Functional urinary incontinence: Secondary | ICD-10-CM | POA: Diagnosis not present

## 2017-02-06 DIAGNOSIS — M545 Low back pain: Secondary | ICD-10-CM | POA: Diagnosis not present

## 2017-02-06 DIAGNOSIS — M6281 Muscle weakness (generalized): Secondary | ICD-10-CM | POA: Diagnosis not present

## 2017-02-06 DIAGNOSIS — I69351 Hemiplegia and hemiparesis following cerebral infarction affecting right dominant side: Secondary | ICD-10-CM | POA: Diagnosis not present

## 2017-02-06 DIAGNOSIS — R293 Abnormal posture: Secondary | ICD-10-CM | POA: Diagnosis not present

## 2017-02-06 DIAGNOSIS — R262 Difficulty in walking, not elsewhere classified: Secondary | ICD-10-CM | POA: Diagnosis not present

## 2017-02-08 DIAGNOSIS — I69351 Hemiplegia and hemiparesis following cerebral infarction affecting right dominant side: Secondary | ICD-10-CM | POA: Diagnosis not present

## 2017-02-08 DIAGNOSIS — R3981 Functional urinary incontinence: Secondary | ICD-10-CM | POA: Diagnosis not present

## 2017-02-08 DIAGNOSIS — R262 Difficulty in walking, not elsewhere classified: Secondary | ICD-10-CM | POA: Diagnosis not present

## 2017-02-08 DIAGNOSIS — M6281 Muscle weakness (generalized): Secondary | ICD-10-CM | POA: Diagnosis not present

## 2017-02-08 DIAGNOSIS — R293 Abnormal posture: Secondary | ICD-10-CM | POA: Diagnosis not present

## 2017-02-08 DIAGNOSIS — M545 Low back pain: Secondary | ICD-10-CM | POA: Diagnosis not present

## 2017-02-09 DIAGNOSIS — R293 Abnormal posture: Secondary | ICD-10-CM | POA: Diagnosis not present

## 2017-02-09 DIAGNOSIS — R3981 Functional urinary incontinence: Secondary | ICD-10-CM | POA: Diagnosis not present

## 2017-02-09 DIAGNOSIS — R262 Difficulty in walking, not elsewhere classified: Secondary | ICD-10-CM | POA: Diagnosis not present

## 2017-02-09 DIAGNOSIS — I69351 Hemiplegia and hemiparesis following cerebral infarction affecting right dominant side: Secondary | ICD-10-CM | POA: Diagnosis not present

## 2017-02-09 DIAGNOSIS — M545 Low back pain: Secondary | ICD-10-CM | POA: Diagnosis not present

## 2017-02-09 DIAGNOSIS — M6281 Muscle weakness (generalized): Secondary | ICD-10-CM | POA: Diagnosis not present

## 2017-02-12 DIAGNOSIS — R293 Abnormal posture: Secondary | ICD-10-CM | POA: Diagnosis not present

## 2017-02-12 DIAGNOSIS — I69351 Hemiplegia and hemiparesis following cerebral infarction affecting right dominant side: Secondary | ICD-10-CM | POA: Diagnosis not present

## 2017-02-12 DIAGNOSIS — R3981 Functional urinary incontinence: Secondary | ICD-10-CM | POA: Diagnosis not present

## 2017-02-12 DIAGNOSIS — M545 Low back pain: Secondary | ICD-10-CM | POA: Diagnosis not present

## 2017-02-12 DIAGNOSIS — M6281 Muscle weakness (generalized): Secondary | ICD-10-CM | POA: Diagnosis not present

## 2017-02-12 DIAGNOSIS — R262 Difficulty in walking, not elsewhere classified: Secondary | ICD-10-CM | POA: Diagnosis not present

## 2017-02-13 DIAGNOSIS — I69351 Hemiplegia and hemiparesis following cerebral infarction affecting right dominant side: Secondary | ICD-10-CM | POA: Diagnosis not present

## 2017-02-13 DIAGNOSIS — M545 Low back pain: Secondary | ICD-10-CM | POA: Diagnosis not present

## 2017-02-13 DIAGNOSIS — R293 Abnormal posture: Secondary | ICD-10-CM | POA: Diagnosis not present

## 2017-02-13 DIAGNOSIS — R262 Difficulty in walking, not elsewhere classified: Secondary | ICD-10-CM | POA: Diagnosis not present

## 2017-02-13 DIAGNOSIS — R3981 Functional urinary incontinence: Secondary | ICD-10-CM | POA: Diagnosis not present

## 2017-02-13 DIAGNOSIS — M6281 Muscle weakness (generalized): Secondary | ICD-10-CM | POA: Diagnosis not present

## 2017-02-14 DIAGNOSIS — R293 Abnormal posture: Secondary | ICD-10-CM | POA: Diagnosis not present

## 2017-02-14 DIAGNOSIS — M545 Low back pain: Secondary | ICD-10-CM | POA: Diagnosis not present

## 2017-02-14 DIAGNOSIS — R262 Difficulty in walking, not elsewhere classified: Secondary | ICD-10-CM | POA: Diagnosis not present

## 2017-02-14 DIAGNOSIS — R3981 Functional urinary incontinence: Secondary | ICD-10-CM | POA: Diagnosis not present

## 2017-02-14 DIAGNOSIS — I69351 Hemiplegia and hemiparesis following cerebral infarction affecting right dominant side: Secondary | ICD-10-CM | POA: Diagnosis not present

## 2017-02-14 DIAGNOSIS — M6281 Muscle weakness (generalized): Secondary | ICD-10-CM | POA: Diagnosis not present

## 2017-02-15 DIAGNOSIS — R262 Difficulty in walking, not elsewhere classified: Secondary | ICD-10-CM | POA: Diagnosis not present

## 2017-02-15 DIAGNOSIS — R293 Abnormal posture: Secondary | ICD-10-CM | POA: Diagnosis not present

## 2017-02-15 DIAGNOSIS — M6281 Muscle weakness (generalized): Secondary | ICD-10-CM | POA: Diagnosis not present

## 2017-02-15 DIAGNOSIS — M545 Low back pain: Secondary | ICD-10-CM | POA: Diagnosis not present

## 2017-02-15 DIAGNOSIS — I69351 Hemiplegia and hemiparesis following cerebral infarction affecting right dominant side: Secondary | ICD-10-CM | POA: Diagnosis not present

## 2017-02-15 DIAGNOSIS — R3981 Functional urinary incontinence: Secondary | ICD-10-CM | POA: Diagnosis not present

## 2017-02-16 DIAGNOSIS — R262 Difficulty in walking, not elsewhere classified: Secondary | ICD-10-CM | POA: Diagnosis not present

## 2017-02-16 DIAGNOSIS — I69351 Hemiplegia and hemiparesis following cerebral infarction affecting right dominant side: Secondary | ICD-10-CM | POA: Diagnosis not present

## 2017-02-16 DIAGNOSIS — M6281 Muscle weakness (generalized): Secondary | ICD-10-CM | POA: Diagnosis not present

## 2017-02-16 DIAGNOSIS — M545 Low back pain: Secondary | ICD-10-CM | POA: Diagnosis not present

## 2017-02-16 DIAGNOSIS — R293 Abnormal posture: Secondary | ICD-10-CM | POA: Diagnosis not present

## 2017-02-16 DIAGNOSIS — R3981 Functional urinary incontinence: Secondary | ICD-10-CM | POA: Diagnosis not present

## 2017-02-19 DIAGNOSIS — I69351 Hemiplegia and hemiparesis following cerebral infarction affecting right dominant side: Secondary | ICD-10-CM | POA: Diagnosis not present

## 2017-02-19 DIAGNOSIS — M6281 Muscle weakness (generalized): Secondary | ICD-10-CM | POA: Diagnosis not present

## 2017-02-19 DIAGNOSIS — R3981 Functional urinary incontinence: Secondary | ICD-10-CM | POA: Diagnosis not present

## 2017-02-19 DIAGNOSIS — R293 Abnormal posture: Secondary | ICD-10-CM | POA: Diagnosis not present

## 2017-02-19 DIAGNOSIS — R262 Difficulty in walking, not elsewhere classified: Secondary | ICD-10-CM | POA: Diagnosis not present

## 2017-02-19 DIAGNOSIS — M545 Low back pain: Secondary | ICD-10-CM | POA: Diagnosis not present

## 2017-02-20 DIAGNOSIS — R262 Difficulty in walking, not elsewhere classified: Secondary | ICD-10-CM | POA: Diagnosis not present

## 2017-02-20 DIAGNOSIS — M6281 Muscle weakness (generalized): Secondary | ICD-10-CM | POA: Diagnosis not present

## 2017-02-20 DIAGNOSIS — R3981 Functional urinary incontinence: Secondary | ICD-10-CM | POA: Diagnosis not present

## 2017-02-20 DIAGNOSIS — M545 Low back pain: Secondary | ICD-10-CM | POA: Diagnosis not present

## 2017-02-20 DIAGNOSIS — I69351 Hemiplegia and hemiparesis following cerebral infarction affecting right dominant side: Secondary | ICD-10-CM | POA: Diagnosis not present

## 2017-02-20 DIAGNOSIS — R293 Abnormal posture: Secondary | ICD-10-CM | POA: Diagnosis not present

## 2017-02-21 DIAGNOSIS — R262 Difficulty in walking, not elsewhere classified: Secondary | ICD-10-CM | POA: Diagnosis not present

## 2017-02-21 DIAGNOSIS — I69351 Hemiplegia and hemiparesis following cerebral infarction affecting right dominant side: Secondary | ICD-10-CM | POA: Diagnosis not present

## 2017-02-21 DIAGNOSIS — M6281 Muscle weakness (generalized): Secondary | ICD-10-CM | POA: Diagnosis not present

## 2017-02-21 DIAGNOSIS — M545 Low back pain: Secondary | ICD-10-CM | POA: Diagnosis not present

## 2017-02-21 DIAGNOSIS — R3981 Functional urinary incontinence: Secondary | ICD-10-CM | POA: Diagnosis not present

## 2017-02-21 DIAGNOSIS — R293 Abnormal posture: Secondary | ICD-10-CM | POA: Diagnosis not present

## 2017-02-22 DIAGNOSIS — I69351 Hemiplegia and hemiparesis following cerebral infarction affecting right dominant side: Secondary | ICD-10-CM | POA: Diagnosis not present

## 2017-02-22 DIAGNOSIS — M6281 Muscle weakness (generalized): Secondary | ICD-10-CM | POA: Diagnosis not present

## 2017-02-22 DIAGNOSIS — R293 Abnormal posture: Secondary | ICD-10-CM | POA: Diagnosis not present

## 2017-02-22 DIAGNOSIS — R3981 Functional urinary incontinence: Secondary | ICD-10-CM | POA: Diagnosis not present

## 2017-02-22 DIAGNOSIS — R262 Difficulty in walking, not elsewhere classified: Secondary | ICD-10-CM | POA: Diagnosis not present

## 2017-02-22 DIAGNOSIS — M545 Low back pain: Secondary | ICD-10-CM | POA: Diagnosis not present

## 2017-02-23 DIAGNOSIS — R3981 Functional urinary incontinence: Secondary | ICD-10-CM | POA: Diagnosis not present

## 2017-02-23 DIAGNOSIS — M6281 Muscle weakness (generalized): Secondary | ICD-10-CM | POA: Diagnosis not present

## 2017-02-23 DIAGNOSIS — R293 Abnormal posture: Secondary | ICD-10-CM | POA: Diagnosis not present

## 2017-02-23 DIAGNOSIS — M545 Low back pain: Secondary | ICD-10-CM | POA: Diagnosis not present

## 2017-02-23 DIAGNOSIS — R262 Difficulty in walking, not elsewhere classified: Secondary | ICD-10-CM | POA: Diagnosis not present

## 2017-02-23 DIAGNOSIS — I69351 Hemiplegia and hemiparesis following cerebral infarction affecting right dominant side: Secondary | ICD-10-CM | POA: Diagnosis not present

## 2017-02-26 DIAGNOSIS — L292 Pruritus vulvae: Secondary | ICD-10-CM | POA: Diagnosis not present

## 2017-02-26 DIAGNOSIS — N8189 Other female genital prolapse: Secondary | ICD-10-CM | POA: Diagnosis not present

## 2017-02-27 DIAGNOSIS — R3981 Functional urinary incontinence: Secondary | ICD-10-CM | POA: Diagnosis not present

## 2017-02-27 DIAGNOSIS — I69351 Hemiplegia and hemiparesis following cerebral infarction affecting right dominant side: Secondary | ICD-10-CM | POA: Diagnosis not present

## 2017-02-27 DIAGNOSIS — M545 Low back pain: Secondary | ICD-10-CM | POA: Diagnosis not present

## 2017-02-27 DIAGNOSIS — R293 Abnormal posture: Secondary | ICD-10-CM | POA: Diagnosis not present

## 2017-02-27 DIAGNOSIS — R262 Difficulty in walking, not elsewhere classified: Secondary | ICD-10-CM | POA: Diagnosis not present

## 2017-02-27 DIAGNOSIS — M6281 Muscle weakness (generalized): Secondary | ICD-10-CM | POA: Diagnosis not present

## 2017-02-28 DIAGNOSIS — R262 Difficulty in walking, not elsewhere classified: Secondary | ICD-10-CM | POA: Diagnosis not present

## 2017-02-28 DIAGNOSIS — R293 Abnormal posture: Secondary | ICD-10-CM | POA: Diagnosis not present

## 2017-02-28 DIAGNOSIS — R3981 Functional urinary incontinence: Secondary | ICD-10-CM | POA: Diagnosis not present

## 2017-02-28 DIAGNOSIS — I69351 Hemiplegia and hemiparesis following cerebral infarction affecting right dominant side: Secondary | ICD-10-CM | POA: Diagnosis not present

## 2017-02-28 DIAGNOSIS — M6281 Muscle weakness (generalized): Secondary | ICD-10-CM | POA: Diagnosis not present

## 2017-02-28 DIAGNOSIS — M545 Low back pain: Secondary | ICD-10-CM | POA: Diagnosis not present

## 2017-03-01 DIAGNOSIS — R293 Abnormal posture: Secondary | ICD-10-CM | POA: Diagnosis not present

## 2017-03-01 DIAGNOSIS — I69351 Hemiplegia and hemiparesis following cerebral infarction affecting right dominant side: Secondary | ICD-10-CM | POA: Diagnosis not present

## 2017-03-01 DIAGNOSIS — R3981 Functional urinary incontinence: Secondary | ICD-10-CM | POA: Diagnosis not present

## 2017-03-01 DIAGNOSIS — M545 Low back pain: Secondary | ICD-10-CM | POA: Diagnosis not present

## 2017-03-01 DIAGNOSIS — M6281 Muscle weakness (generalized): Secondary | ICD-10-CM | POA: Diagnosis not present

## 2017-03-01 DIAGNOSIS — R262 Difficulty in walking, not elsewhere classified: Secondary | ICD-10-CM | POA: Diagnosis not present

## 2017-03-02 DIAGNOSIS — R293 Abnormal posture: Secondary | ICD-10-CM | POA: Diagnosis not present

## 2017-03-02 DIAGNOSIS — R3981 Functional urinary incontinence: Secondary | ICD-10-CM | POA: Diagnosis not present

## 2017-03-02 DIAGNOSIS — M6281 Muscle weakness (generalized): Secondary | ICD-10-CM | POA: Diagnosis not present

## 2017-03-02 DIAGNOSIS — R262 Difficulty in walking, not elsewhere classified: Secondary | ICD-10-CM | POA: Diagnosis not present

## 2017-03-02 DIAGNOSIS — M545 Low back pain: Secondary | ICD-10-CM | POA: Diagnosis not present

## 2017-03-02 DIAGNOSIS — I69351 Hemiplegia and hemiparesis following cerebral infarction affecting right dominant side: Secondary | ICD-10-CM | POA: Diagnosis not present

## 2017-03-06 DIAGNOSIS — M6281 Muscle weakness (generalized): Secondary | ICD-10-CM | POA: Diagnosis not present

## 2017-03-06 DIAGNOSIS — R293 Abnormal posture: Secondary | ICD-10-CM | POA: Diagnosis not present

## 2017-03-06 DIAGNOSIS — I69351 Hemiplegia and hemiparesis following cerebral infarction affecting right dominant side: Secondary | ICD-10-CM | POA: Diagnosis not present

## 2017-03-06 DIAGNOSIS — R262 Difficulty in walking, not elsewhere classified: Secondary | ICD-10-CM | POA: Diagnosis not present

## 2017-03-06 DIAGNOSIS — M545 Low back pain: Secondary | ICD-10-CM | POA: Diagnosis not present

## 2017-03-06 DIAGNOSIS — R3981 Functional urinary incontinence: Secondary | ICD-10-CM | POA: Diagnosis not present

## 2017-03-12 DIAGNOSIS — M545 Low back pain: Secondary | ICD-10-CM | POA: Diagnosis not present

## 2017-03-12 DIAGNOSIS — R293 Abnormal posture: Secondary | ICD-10-CM | POA: Diagnosis not present

## 2017-03-12 DIAGNOSIS — R3981 Functional urinary incontinence: Secondary | ICD-10-CM | POA: Diagnosis not present

## 2017-03-12 DIAGNOSIS — M6281 Muscle weakness (generalized): Secondary | ICD-10-CM | POA: Diagnosis not present

## 2017-03-15 DIAGNOSIS — M545 Low back pain: Secondary | ICD-10-CM | POA: Diagnosis not present

## 2017-03-15 DIAGNOSIS — R3981 Functional urinary incontinence: Secondary | ICD-10-CM | POA: Diagnosis not present

## 2017-03-15 DIAGNOSIS — M6281 Muscle weakness (generalized): Secondary | ICD-10-CM | POA: Diagnosis not present

## 2017-03-15 DIAGNOSIS — R293 Abnormal posture: Secondary | ICD-10-CM | POA: Diagnosis not present

## 2017-03-16 DIAGNOSIS — M6281 Muscle weakness (generalized): Secondary | ICD-10-CM | POA: Diagnosis not present

## 2017-03-16 DIAGNOSIS — R3981 Functional urinary incontinence: Secondary | ICD-10-CM | POA: Diagnosis not present

## 2017-03-16 DIAGNOSIS — R293 Abnormal posture: Secondary | ICD-10-CM | POA: Diagnosis not present

## 2017-03-16 DIAGNOSIS — M545 Low back pain: Secondary | ICD-10-CM | POA: Diagnosis not present

## 2017-03-20 DIAGNOSIS — R293 Abnormal posture: Secondary | ICD-10-CM | POA: Diagnosis not present

## 2017-03-20 DIAGNOSIS — R3981 Functional urinary incontinence: Secondary | ICD-10-CM | POA: Diagnosis not present

## 2017-03-20 DIAGNOSIS — M545 Low back pain: Secondary | ICD-10-CM | POA: Diagnosis not present

## 2017-03-20 DIAGNOSIS — M6281 Muscle weakness (generalized): Secondary | ICD-10-CM | POA: Diagnosis not present

## 2017-03-22 DIAGNOSIS — M6281 Muscle weakness (generalized): Secondary | ICD-10-CM | POA: Diagnosis not present

## 2017-03-22 DIAGNOSIS — R3981 Functional urinary incontinence: Secondary | ICD-10-CM | POA: Diagnosis not present

## 2017-03-22 DIAGNOSIS — R293 Abnormal posture: Secondary | ICD-10-CM | POA: Diagnosis not present

## 2017-03-22 DIAGNOSIS — M545 Low back pain: Secondary | ICD-10-CM | POA: Diagnosis not present

## 2017-03-30 DIAGNOSIS — N8189 Other female genital prolapse: Secondary | ICD-10-CM | POA: Diagnosis not present

## 2017-03-30 DIAGNOSIS — B373 Candidiasis of vulva and vagina: Secondary | ICD-10-CM | POA: Diagnosis not present

## 2017-04-24 DIAGNOSIS — N760 Acute vaginitis: Secondary | ICD-10-CM | POA: Diagnosis not present

## 2017-04-24 DIAGNOSIS — N8189 Other female genital prolapse: Secondary | ICD-10-CM | POA: Diagnosis not present

## 2017-04-26 DIAGNOSIS — L720 Epidermal cyst: Secondary | ICD-10-CM | POA: Diagnosis not present

## 2017-04-26 DIAGNOSIS — L603 Nail dystrophy: Secondary | ICD-10-CM | POA: Diagnosis not present

## 2017-04-26 DIAGNOSIS — D2261 Melanocytic nevi of right upper limb, including shoulder: Secondary | ICD-10-CM | POA: Diagnosis not present

## 2017-04-26 DIAGNOSIS — L298 Other pruritus: Secondary | ICD-10-CM | POA: Diagnosis not present

## 2017-04-26 DIAGNOSIS — L239 Allergic contact dermatitis, unspecified cause: Secondary | ICD-10-CM | POA: Diagnosis not present

## 2017-04-26 DIAGNOSIS — L814 Other melanin hyperpigmentation: Secondary | ICD-10-CM | POA: Diagnosis not present

## 2017-04-26 DIAGNOSIS — D1801 Hemangioma of skin and subcutaneous tissue: Secondary | ICD-10-CM | POA: Diagnosis not present

## 2017-04-26 DIAGNOSIS — L821 Other seborrheic keratosis: Secondary | ICD-10-CM | POA: Diagnosis not present

## 2017-05-22 DIAGNOSIS — N8189 Other female genital prolapse: Secondary | ICD-10-CM | POA: Diagnosis not present

## 2017-05-22 DIAGNOSIS — N760 Acute vaginitis: Secondary | ICD-10-CM | POA: Diagnosis not present

## 2017-05-22 DIAGNOSIS — M150 Primary generalized (osteo)arthritis: Secondary | ICD-10-CM | POA: Diagnosis not present

## 2017-05-22 DIAGNOSIS — E039 Hypothyroidism, unspecified: Secondary | ICD-10-CM | POA: Diagnosis not present

## 2017-05-22 DIAGNOSIS — E785 Hyperlipidemia, unspecified: Secondary | ICD-10-CM | POA: Diagnosis not present

## 2017-05-22 DIAGNOSIS — I6309 Cerebral infarction due to thrombosis of other precerebral artery: Secondary | ICD-10-CM | POA: Diagnosis not present

## 2017-05-31 DIAGNOSIS — I639 Cerebral infarction, unspecified: Secondary | ICD-10-CM | POA: Diagnosis not present

## 2017-05-31 DIAGNOSIS — I10 Essential (primary) hypertension: Secondary | ICD-10-CM | POA: Diagnosis not present

## 2017-05-31 DIAGNOSIS — M5442 Lumbago with sciatica, left side: Secondary | ICD-10-CM | POA: Diagnosis not present

## 2017-05-31 DIAGNOSIS — E785 Hyperlipidemia, unspecified: Secondary | ICD-10-CM | POA: Diagnosis not present

## 2017-05-31 DIAGNOSIS — M150 Primary generalized (osteo)arthritis: Secondary | ICD-10-CM | POA: Diagnosis not present

## 2017-05-31 DIAGNOSIS — E039 Hypothyroidism, unspecified: Secondary | ICD-10-CM | POA: Diagnosis not present

## 2017-06-26 DIAGNOSIS — N8189 Other female genital prolapse: Secondary | ICD-10-CM | POA: Diagnosis not present

## 2017-06-26 DIAGNOSIS — N760 Acute vaginitis: Secondary | ICD-10-CM | POA: Diagnosis not present

## 2017-07-05 DIAGNOSIS — N760 Acute vaginitis: Secondary | ICD-10-CM | POA: Diagnosis not present

## 2017-07-05 DIAGNOSIS — N8189 Other female genital prolapse: Secondary | ICD-10-CM | POA: Diagnosis not present

## 2017-09-21 DIAGNOSIS — R109 Unspecified abdominal pain: Secondary | ICD-10-CM | POA: Diagnosis not present

## 2017-09-21 DIAGNOSIS — J029 Acute pharyngitis, unspecified: Secondary | ICD-10-CM | POA: Diagnosis not present

## 2017-09-21 DIAGNOSIS — N390 Urinary tract infection, site not specified: Secondary | ICD-10-CM | POA: Diagnosis not present

## 2017-09-21 DIAGNOSIS — J018 Other acute sinusitis: Secondary | ICD-10-CM | POA: Diagnosis not present

## 2017-09-25 ENCOUNTER — Ambulatory Visit: Payer: Self-pay | Admitting: Endocrinology

## 2017-12-06 DIAGNOSIS — Z0000 Encounter for general adult medical examination without abnormal findings: Secondary | ICD-10-CM | POA: Diagnosis not present

## 2017-12-06 DIAGNOSIS — I639 Cerebral infarction, unspecified: Secondary | ICD-10-CM | POA: Diagnosis not present

## 2017-12-06 DIAGNOSIS — E785 Hyperlipidemia, unspecified: Secondary | ICD-10-CM | POA: Diagnosis not present

## 2017-12-06 DIAGNOSIS — I10 Essential (primary) hypertension: Secondary | ICD-10-CM | POA: Diagnosis not present

## 2017-12-06 DIAGNOSIS — E039 Hypothyroidism, unspecified: Secondary | ICD-10-CM | POA: Diagnosis not present

## 2017-12-08 IMAGING — DX DG ABDOMEN 2V
2 series · 2 of 2 positions shown · non-contrast
Comparison: CT abdomen and pelvis March 22, 2004

CLINICAL DATA: Constipation and left lower quadrant tenderness

EXAM:
ABDOMEN - 2 VIEW

[abdomen erect]
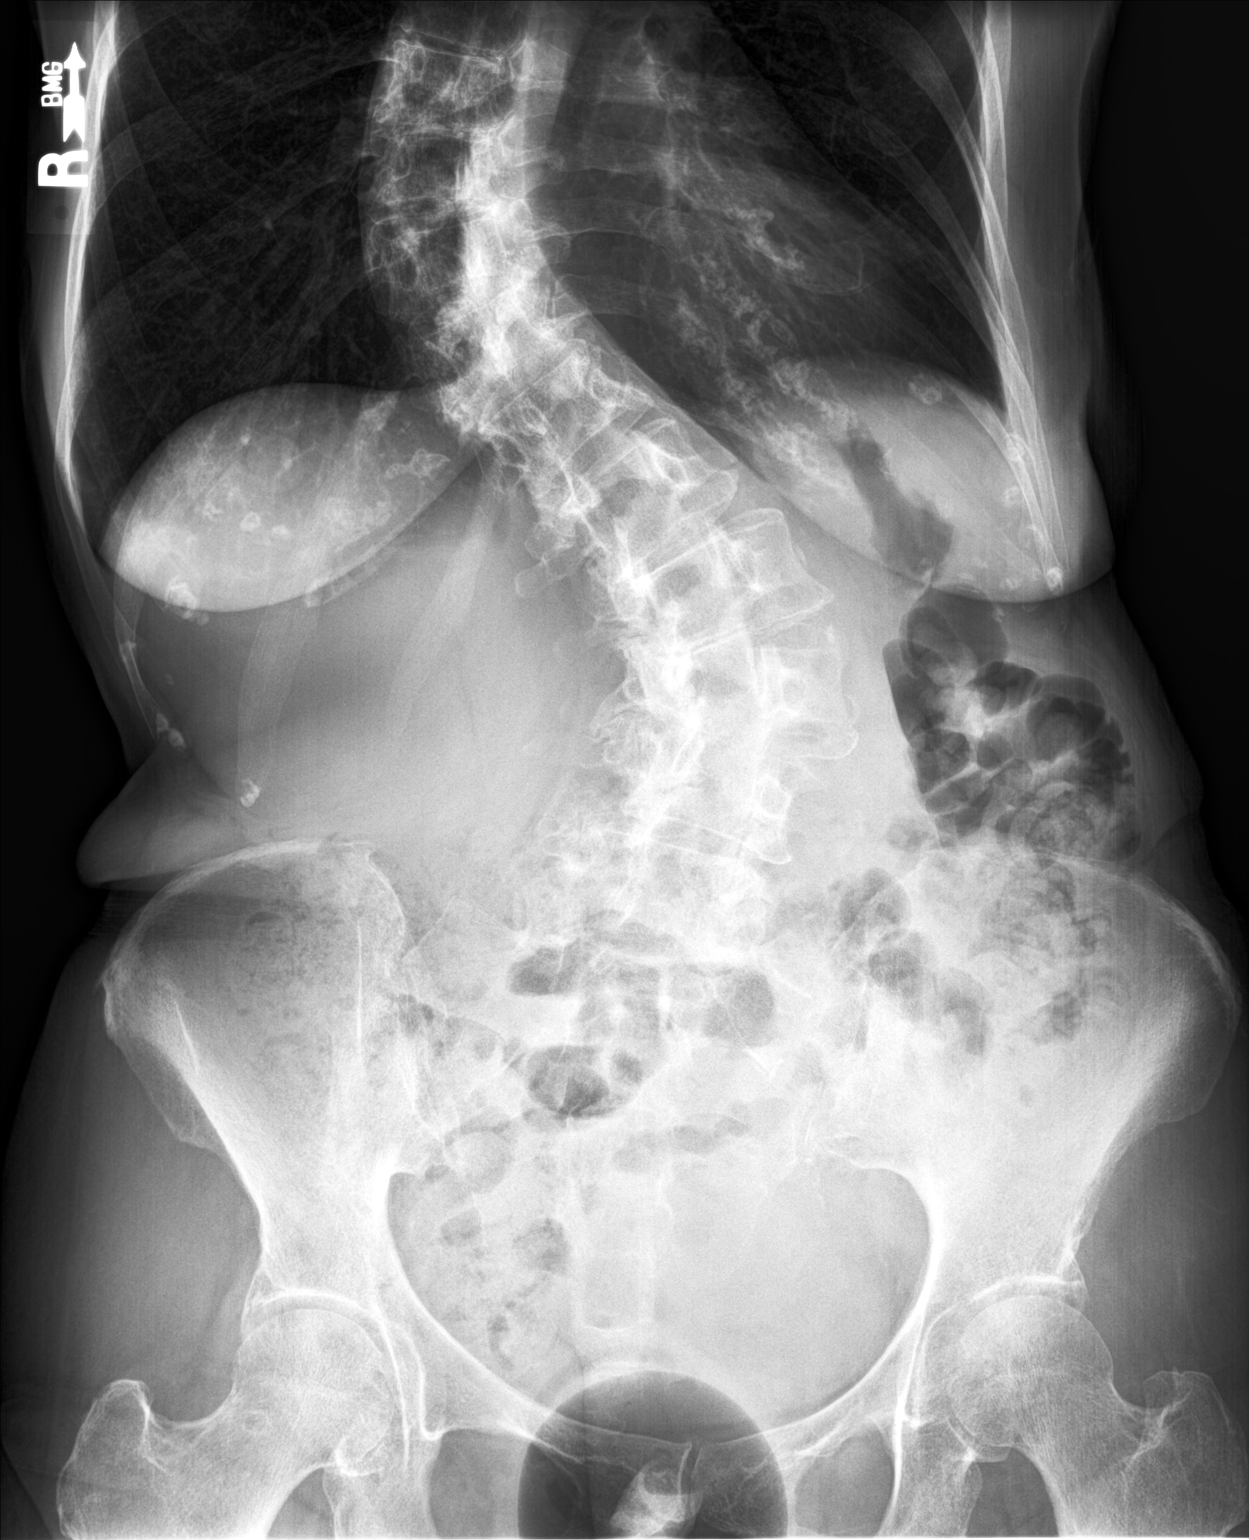

[abdomen supine]
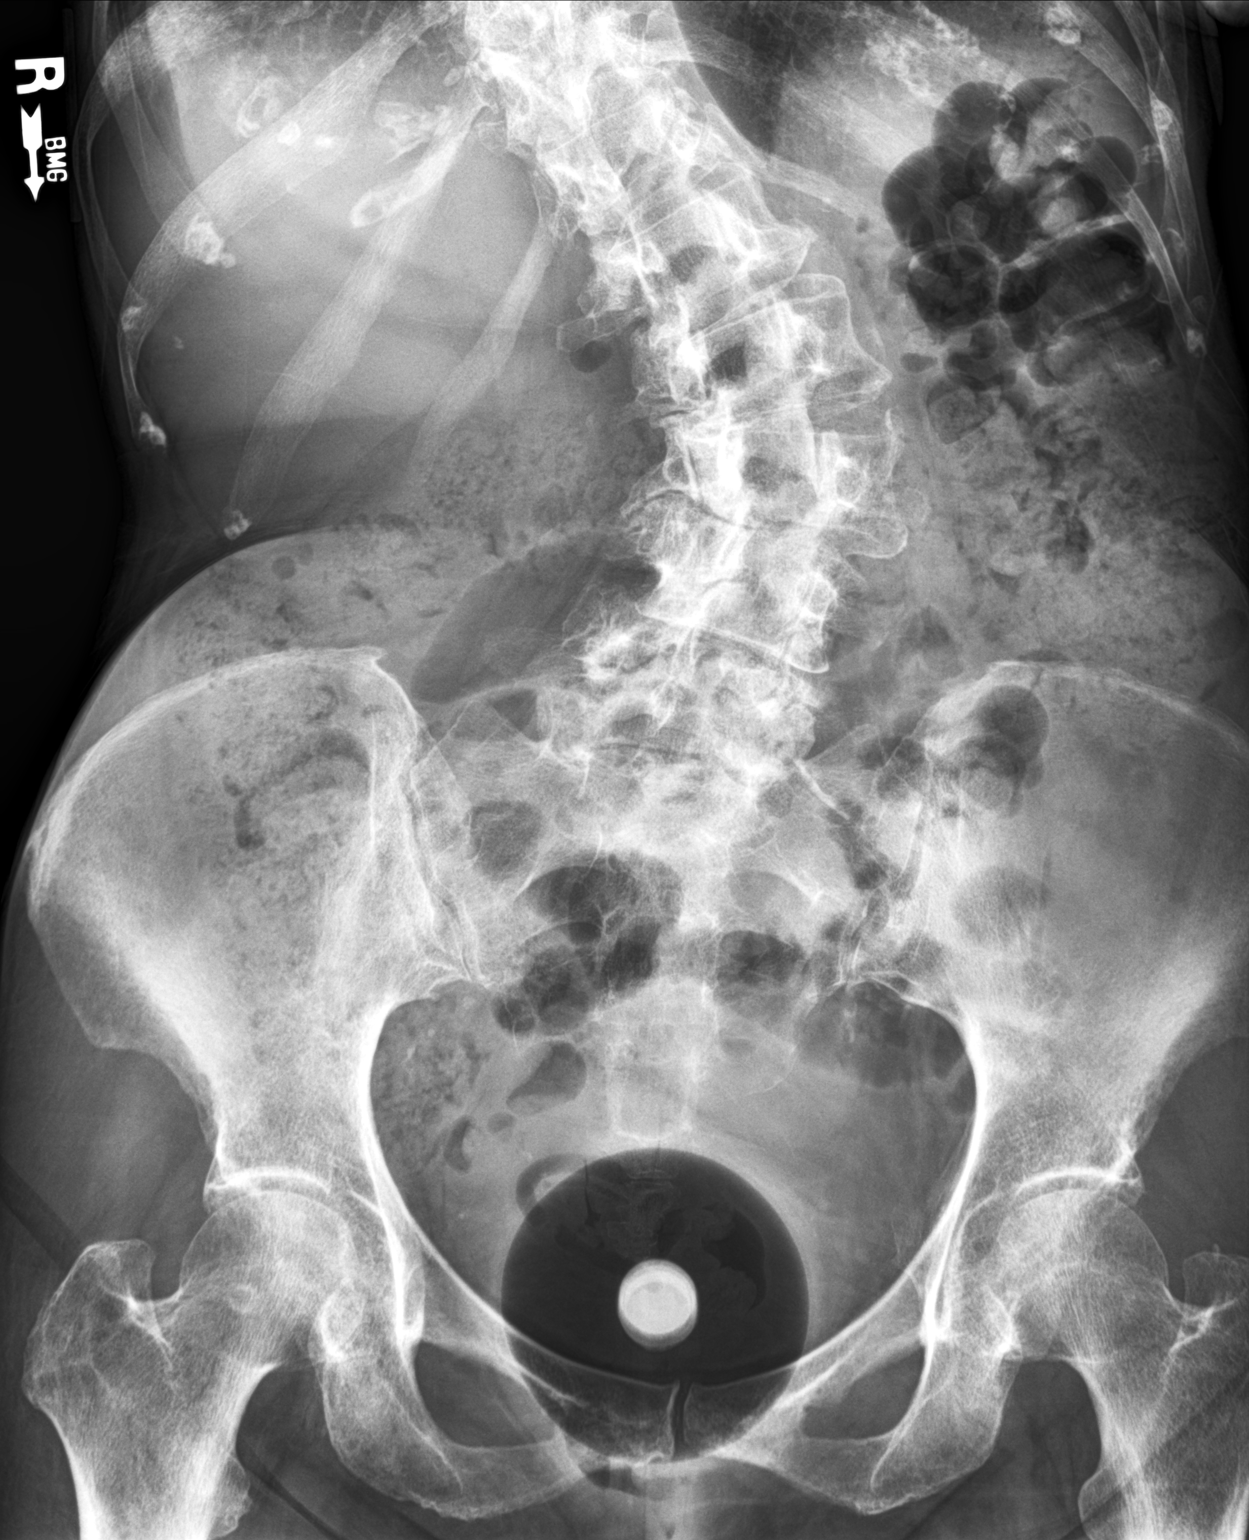

[2 of 2 positions shown; findings below may reference images not displayed]

FINDINGS: Frontal and lateral views were obtained. There is diffuse stool
throughout the colon. There is no bowel dilatation or air-fluid
level suggesting bowel obstruction. No free air evident. There is a
rounded lucent structure in the lower pelvis midline, possibly a
balloon catheter. Visualized lung bases are clear. There is marked
lumbar levoscoliosis. There is atherosclerotic calcification in the
aorta.
IMPRESSION: Question balloon catheter lower pelvis midline. Diffuse stool
throughout colon. No bowel obstruction or free air. Marked
scoliosis.

## 2017-12-13 DIAGNOSIS — M545 Low back pain: Secondary | ICD-10-CM | POA: Diagnosis not present

## 2017-12-13 DIAGNOSIS — I10 Essential (primary) hypertension: Secondary | ICD-10-CM | POA: Diagnosis not present

## 2017-12-13 DIAGNOSIS — E871 Hypo-osmolality and hyponatremia: Secondary | ICD-10-CM | POA: Diagnosis not present

## 2017-12-13 DIAGNOSIS — I639 Cerebral infarction, unspecified: Secondary | ICD-10-CM | POA: Diagnosis not present

## 2017-12-13 DIAGNOSIS — E039 Hypothyroidism, unspecified: Secondary | ICD-10-CM | POA: Diagnosis not present

## 2017-12-13 DIAGNOSIS — E785 Hyperlipidemia, unspecified: Secondary | ICD-10-CM | POA: Diagnosis not present

## 2017-12-13 DIAGNOSIS — R410 Disorientation, unspecified: Secondary | ICD-10-CM | POA: Diagnosis not present

## 2017-12-13 DIAGNOSIS — M150 Primary generalized (osteo)arthritis: Secondary | ICD-10-CM | POA: Diagnosis not present

## 2017-12-14 ENCOUNTER — Other Ambulatory Visit: Payer: Self-pay | Admitting: Internal Medicine

## 2017-12-14 DIAGNOSIS — R410 Disorientation, unspecified: Secondary | ICD-10-CM

## 2017-12-19 ENCOUNTER — Other Ambulatory Visit: Payer: Self-pay

## 2017-12-25 ENCOUNTER — Ambulatory Visit
Admission: RE | Admit: 2017-12-25 | Discharge: 2017-12-25 | Disposition: A | Discharge status: Home / Self Care | Payer: Medicare Other | Source: Ambulatory Visit | Attending: Internal Medicine | Admitting: Internal Medicine

## 2017-12-25 DIAGNOSIS — R410 Disorientation, unspecified: Secondary | ICD-10-CM | POA: Diagnosis not present

## 2017-12-27 DIAGNOSIS — R410 Disorientation, unspecified: Secondary | ICD-10-CM | POA: Diagnosis not present

## 2017-12-27 DIAGNOSIS — I10 Essential (primary) hypertension: Secondary | ICD-10-CM | POA: Diagnosis not present

## 2017-12-27 DIAGNOSIS — E039 Hypothyroidism, unspecified: Secondary | ICD-10-CM | POA: Diagnosis not present

## 2017-12-27 DIAGNOSIS — E785 Hyperlipidemia, unspecified: Secondary | ICD-10-CM | POA: Diagnosis not present

## 2018-01-01 DIAGNOSIS — R410 Disorientation, unspecified: Secondary | ICD-10-CM | POA: Diagnosis not present

## 2018-01-01 DIAGNOSIS — I10 Essential (primary) hypertension: Secondary | ICD-10-CM | POA: Diagnosis not present

## 2018-01-01 DIAGNOSIS — E039 Hypothyroidism, unspecified: Secondary | ICD-10-CM | POA: Diagnosis not present

## 2018-01-01 DIAGNOSIS — G4700 Insomnia, unspecified: Secondary | ICD-10-CM | POA: Diagnosis not present

## 2018-01-01 DIAGNOSIS — F411 Generalized anxiety disorder: Secondary | ICD-10-CM | POA: Diagnosis not present

## 2018-01-21 DIAGNOSIS — Z6821 Body mass index (BMI) 21.0-21.9, adult: Secondary | ICD-10-CM | POA: Diagnosis not present

## 2018-01-21 DIAGNOSIS — E039 Hypothyroidism, unspecified: Secondary | ICD-10-CM | POA: Diagnosis not present

## 2018-01-21 DIAGNOSIS — Z9889 Other specified postprocedural states: Secondary | ICD-10-CM | POA: Diagnosis not present

## 2018-03-21 DIAGNOSIS — E039 Hypothyroidism, unspecified: Secondary | ICD-10-CM | POA: Diagnosis not present

## 2018-04-01 IMAGING — CR DG CHEST 2V
2 series · 2 of 2 positions shown · non-contrast
Comparison: 10/29/2014.

CLINICAL DATA: Dull chest pain for 2 days.

EXAM:
CHEST  2 VIEW

[w chest pa]
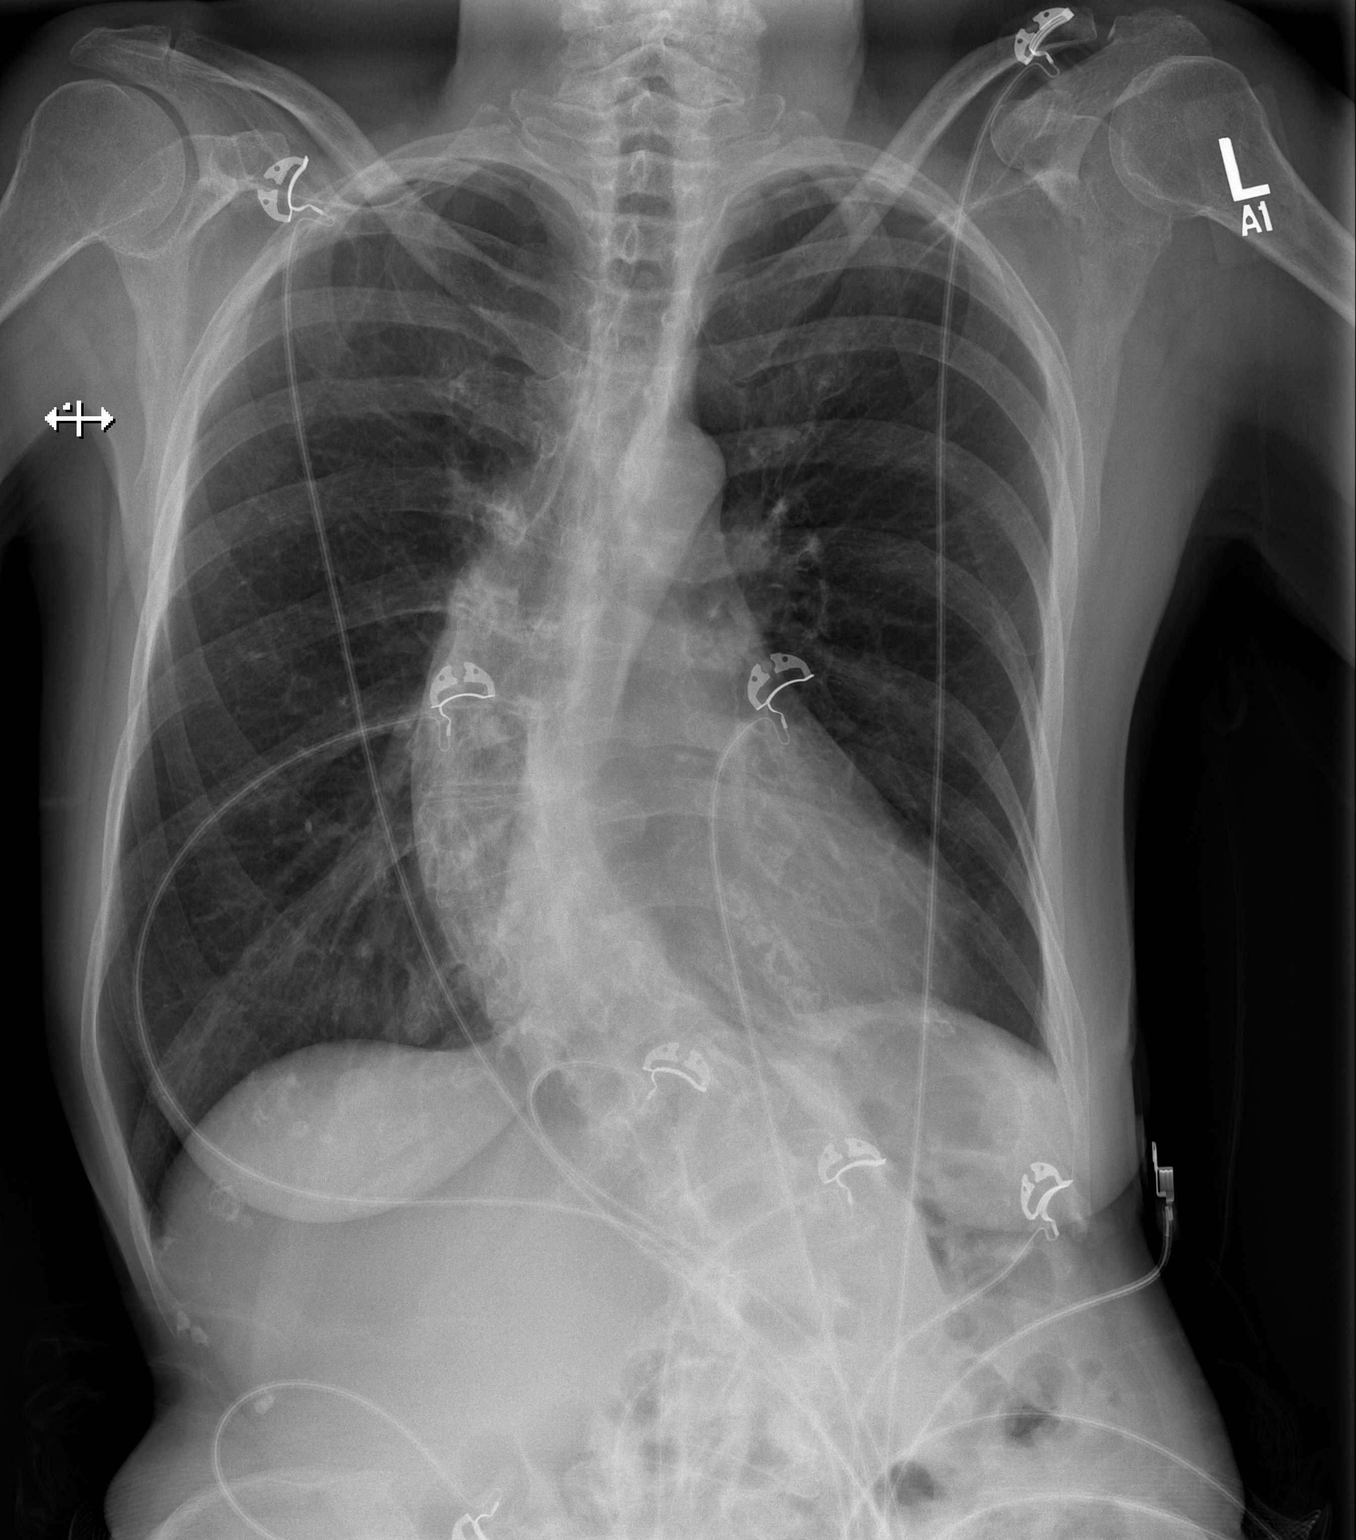

[w chest lat]
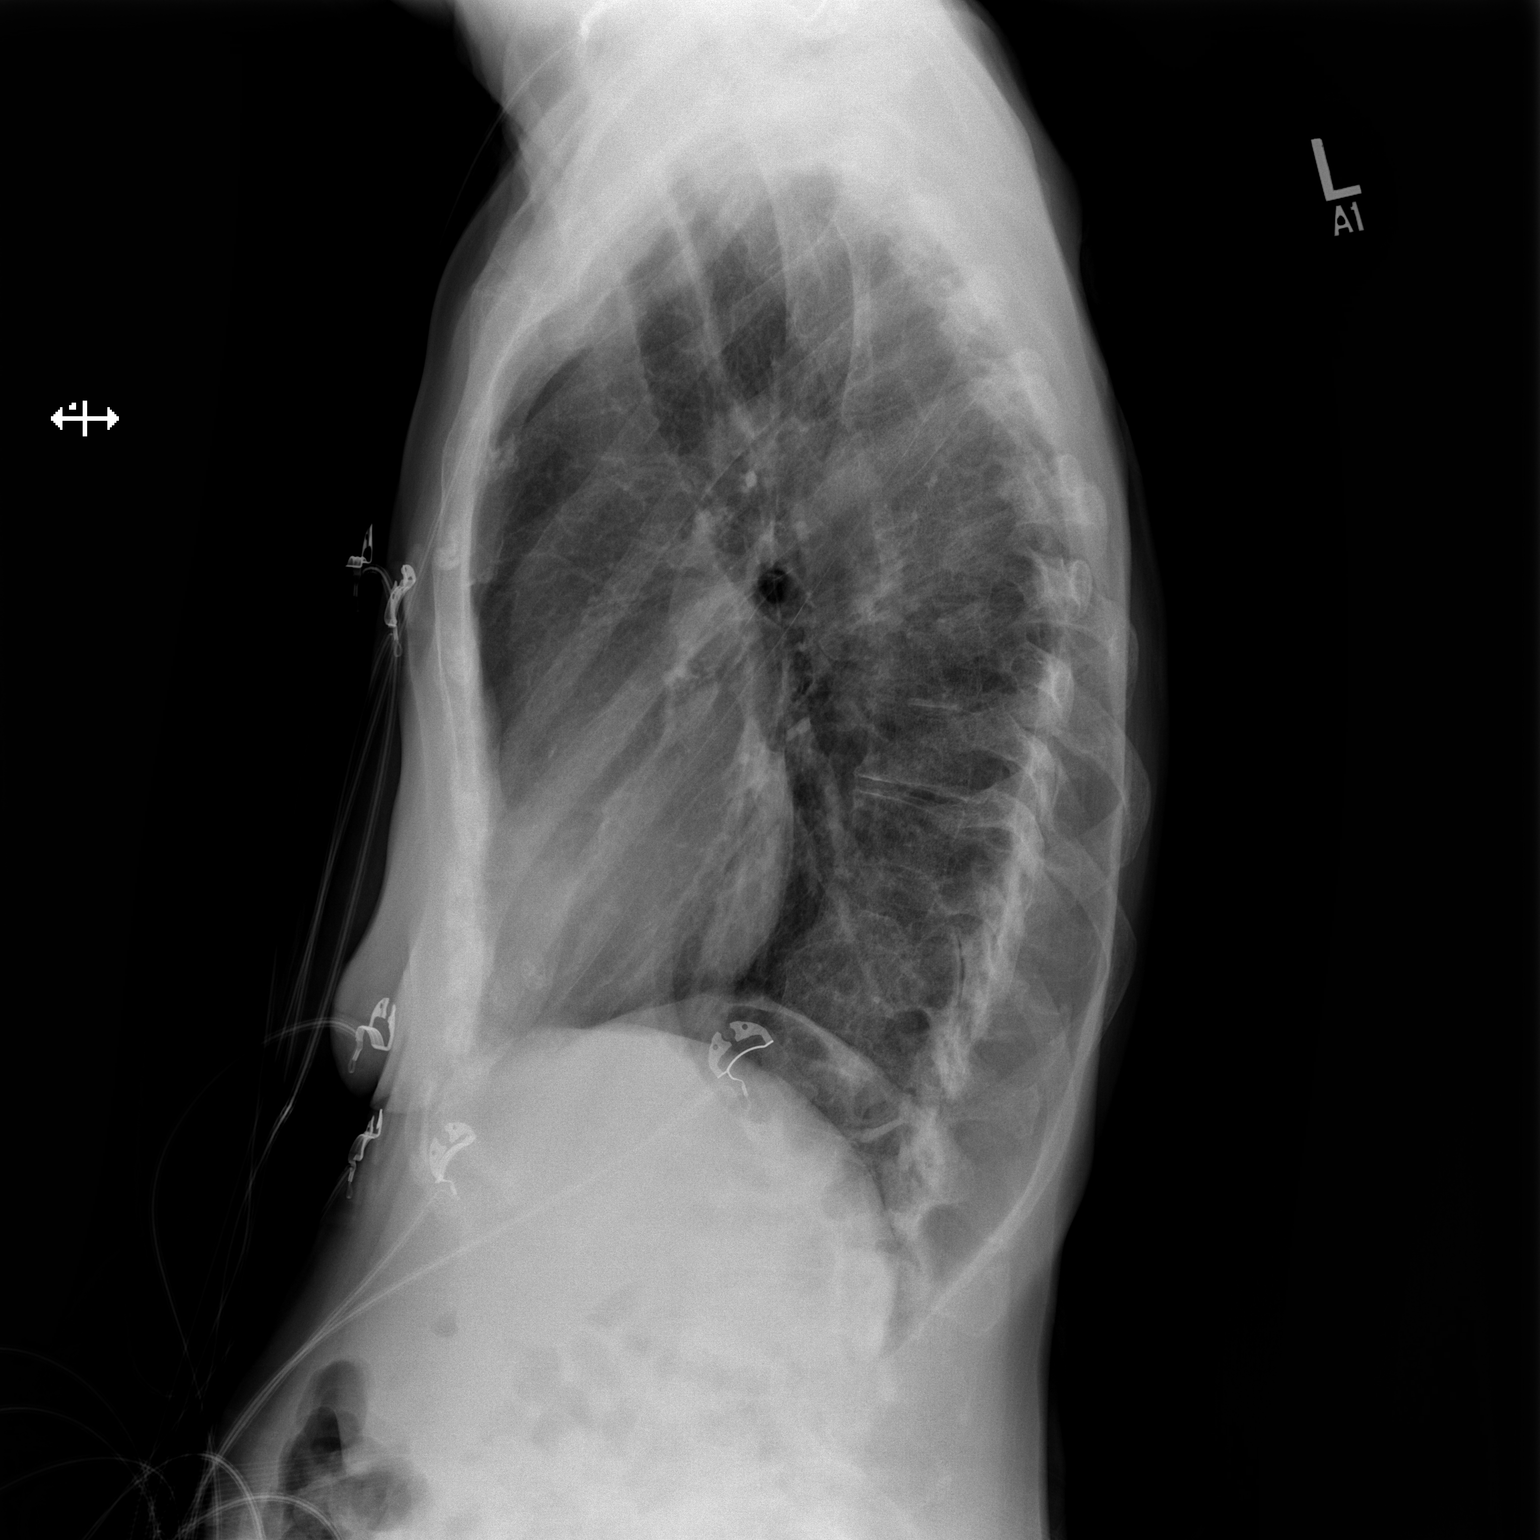

[2 of 2 positions shown; findings below may reference images not displayed]

FINDINGS: The heart size and mediastinal contours are within normal limits.
Both lungs are clear. Severe thoracolumbar scoliosis convex RIGHT,
unchanged.
IMPRESSION: No active cardiopulmonary disease.  Stable exam.

## 2018-04-02 IMAGING — MR MR MRA HEAD W/O CM
10 of 13 series · 28 of 48 positions shown · non-contrast
Comparison: CT head 04/26/2016.

CLINICAL DATA: Continued surveillance intraparenchymal hemorrhage.
RIGHT-sided weakness.

EXAM:
MRI HEAD WITHOUT CONTRAST
MRA HEAD WITHOUT CONTRAST
TECHNIQUE: Multiplanar, multiecho pulse sequences of the brain and surrounding
structures were obtained without intravenous contrast. Angiographic
images of the head were obtained using MRA technique without
contrast.

[Series 3: DWI · axial · 3.0mm · 0.94mm/px · z∈[-86,+41]mm · 6 of 91 slices shown (1 of 2)]
[im 1/91]
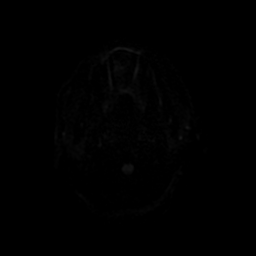
[im 19/91]
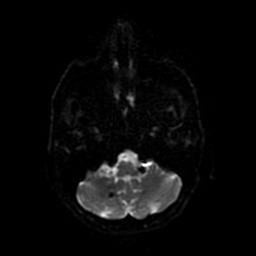
[im 37/91]
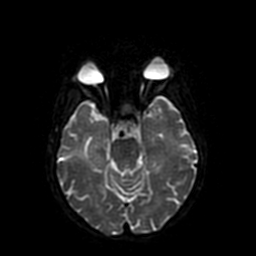
[im 55/91]
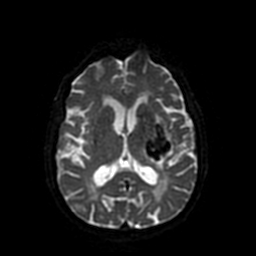
[im 73/91]
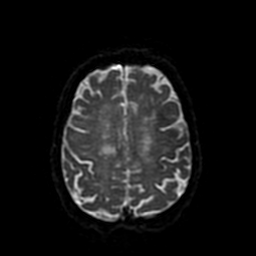
[im 91/91]
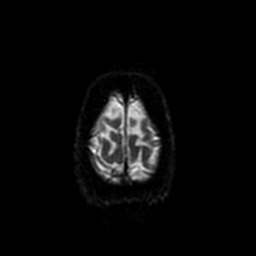

[Series 4: (person_name) · axial · 3.0mm · 0.47mm/px · z∈[-86,+42]mm · 6 of 92 slices shown]
[im 1/92]
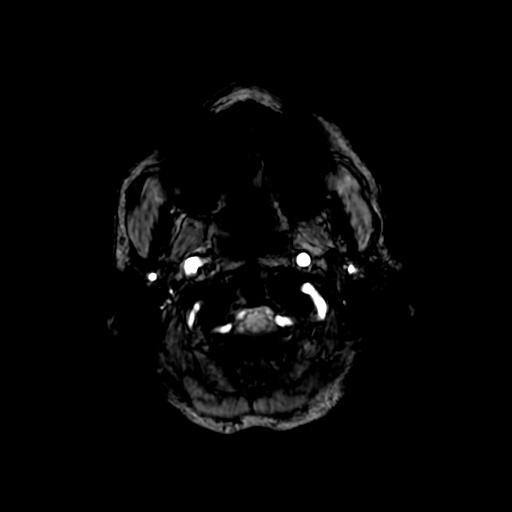
[im 19/92]
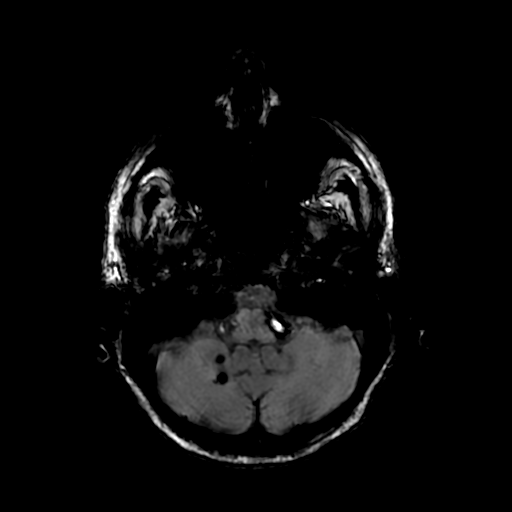
[im 37/92]
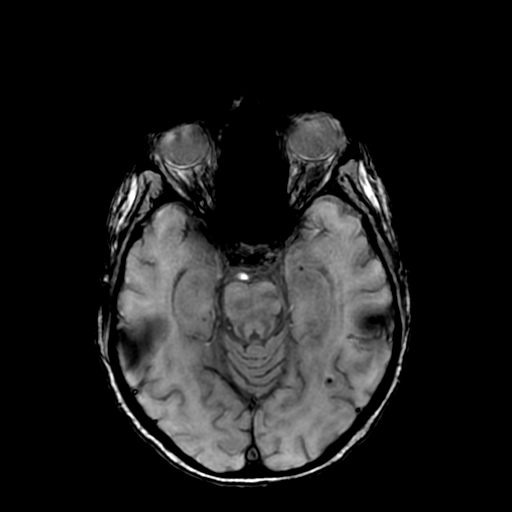
[im 55/92]
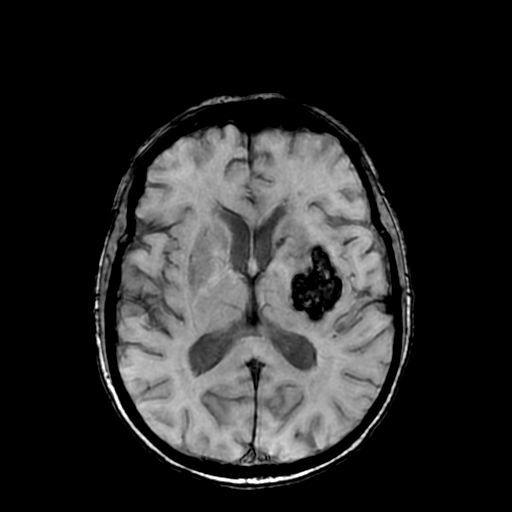
[im 73/92]
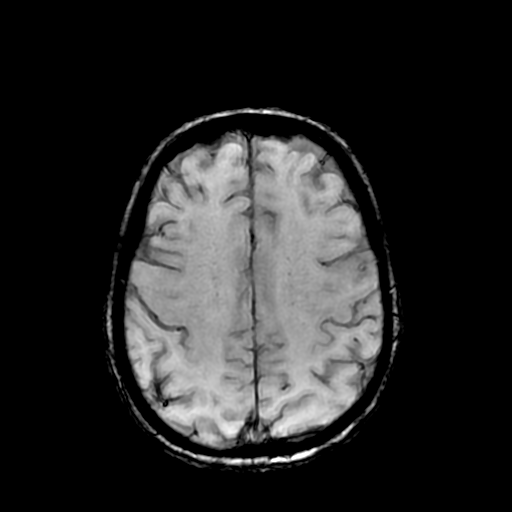
[im 92/92]
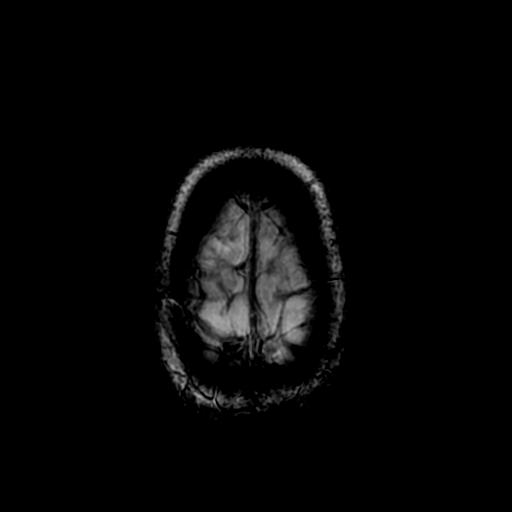

[Series 5: ax (id) 2 · axial · 1.0mm · 0.43mm/px · z∈[-64,-49]mm · 2 of 184 slices shown]
[im 1/184]
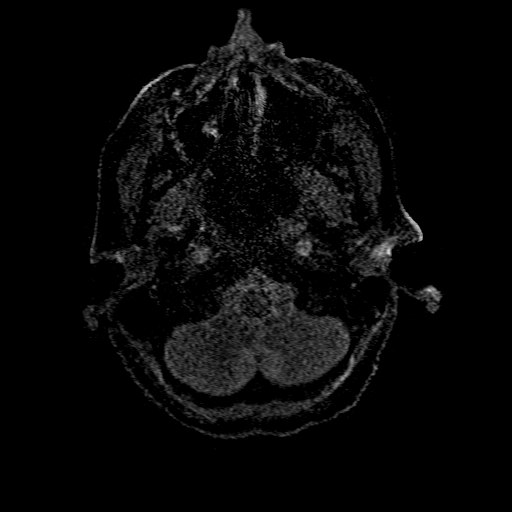
[im 34/184]
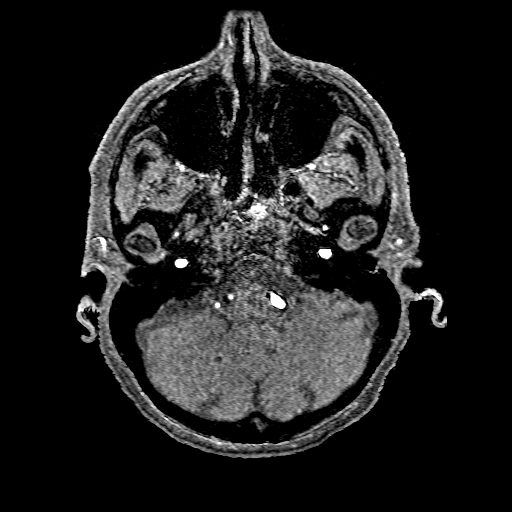

[Series 7: T2 · axial · 5.0mm · 0.47mm/px · 1 of 23 slices shown (1 of 2)]
[im 1/23]
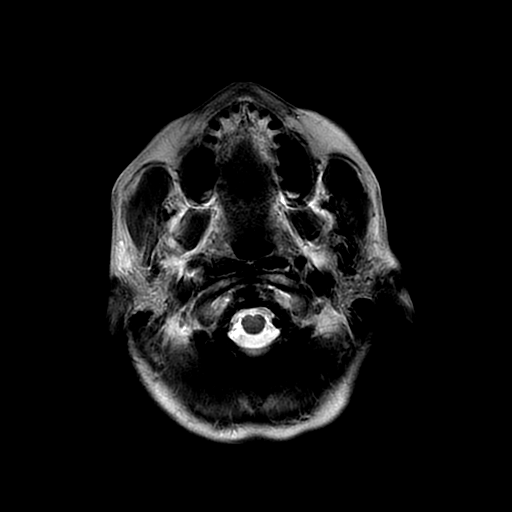

[Series 8: FLAIR · axial · 5.0mm · 0.47mm/px · 1 of 23 slices shown (1 of 2)]
[im 1/23]
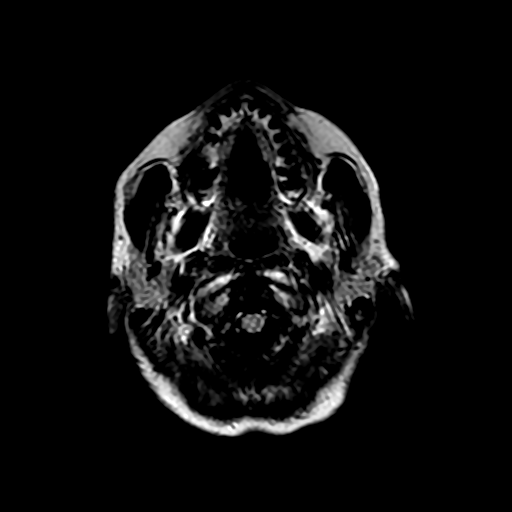

[Series 9: FLAIR · sagittal · 5.0mm · 0.47mm/px · 1 of 23 slices shown (2 of 2)]
[im 1/23]
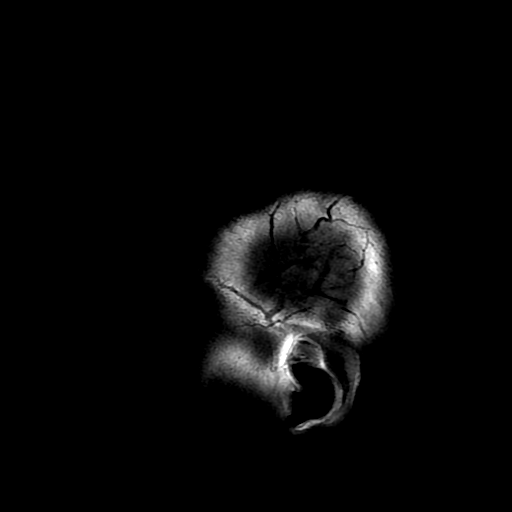

[Series 10: DWI · coronal · 4.0mm · 0.94mm/px · 4 of 66 slices shown (2 of 2)]
[im 1/66]
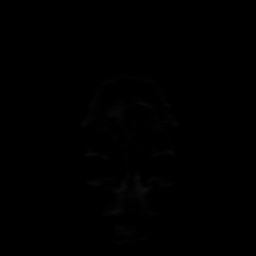
[im 22/66]
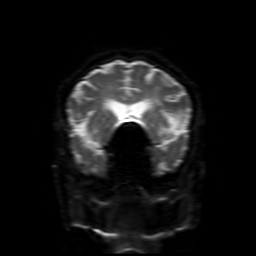
[im 44/66]
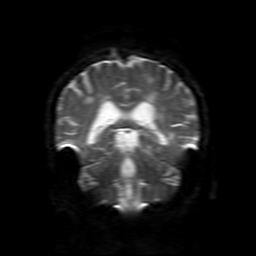
[im 66/66]
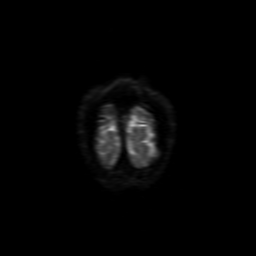

[Series 12: T2 · coronal · 5.0mm · 0.39mm/px · 2 of 28 slices shown (2 of 2)]
[im 1/28]
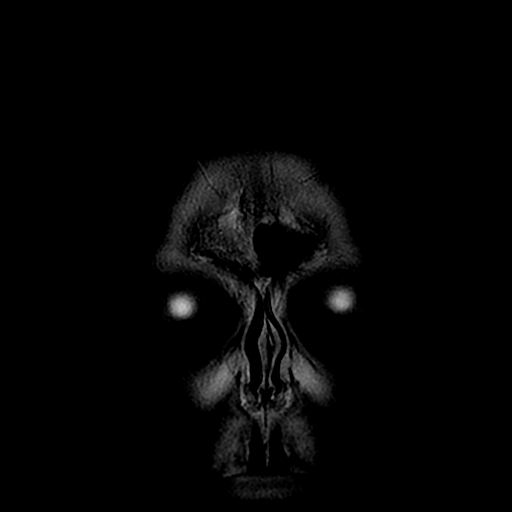
[im 28/28]
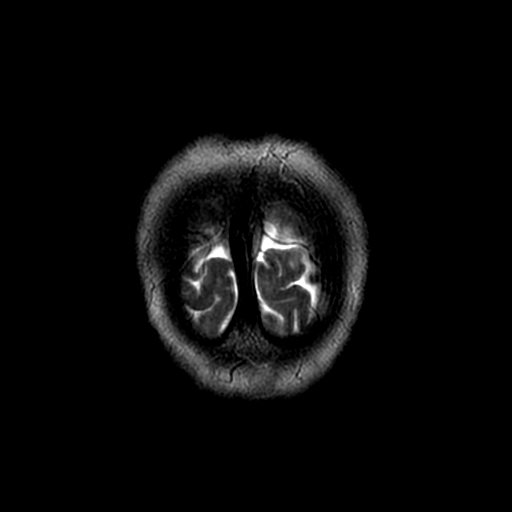

[Series 350: ADC · axial · 3.0mm · 0.94mm/px · z∈[-86,+41]mm · 3 of 43 slices shown (1 of 2)]
[im 1/43]
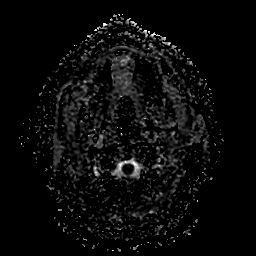
[im 22/43]
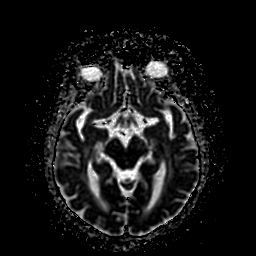
[im 43/43]
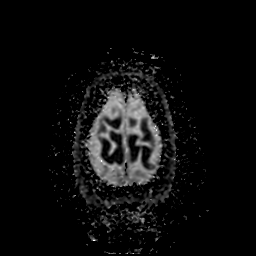

[Series 1050: ADC · coronal · 4.0mm · 0.94mm/px · 2 of 33 slices shown (2 of 2)]
[im 1/33]
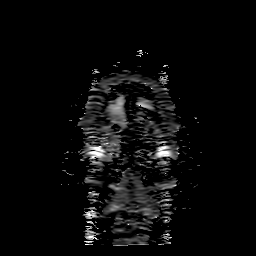
[im 33/33]
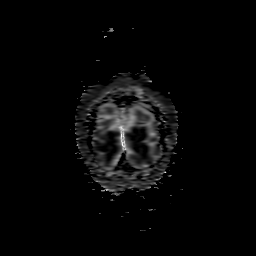

[28 of 48 positions shown; findings below may reference images not displayed]

FINDINGS: MRI HEAD FINDINGS

Brain: Acute intraparenchymal basal ganglia hemorrhage, epicenter
posterior lentiform nucleus and internal capsule on the LEFT,
markedly T2 hypointense consistent with deoxyhemoglobin, correlating
with CT appearance, having cross-sectional measurements of 24 x 29 x
30 mm (R-L x A-P x C-C), corresponding to a volume of 10.5 mL. Mild
surrounding edema. No midline shift.

Normal for age cerebral volume. Extensive focal and confluent T2 and
FLAIR hyperintensities throughout the white matter, likely chronic
microvascular ischemic change.

Gradient sequence demonstrates numerous areas of susceptibility
(greater than 10) throughout the deep white matter, deep nuclei,
brainstem, vermis, and cerebellar hemispheres consistent with
microbleeds related to chronic hypertensive cerebrovascular disease.

Vascular: Heavily calcified and dolichoectatic distal LEFT vertebral
demonstrates susceptibility due to mineralization representing heavy
calcification along its V4 segment. Flow voids are maintained
throughout.

Skull and upper cervical spine: Unremarkable.

Sinuses/Orbits: No layering sinus or mastoid fluid.

Other: None.  Compared with yesterday's CT, similar appearance.

MRA HEAD FINDINGS

Dolichoectatic but widely patent internal carotid arteries. Slight
BILATERAL cavernous irregularity and outpouchings representing non
stenotic atheromatous change. No proximal anterior or middle
cerebral artery stenosis or dissection.

Dolichoectatic basilar artery swings to the RIGHT. Dolichoectatic
distal LEFT vertebral artery mildly dilated without evidence for
dissection. RIGHT vertebral artery is diminutive, contributes only
to PICA.

Slight irregularity distal MCA and PCA branches consistent with
intracranial atherosclerotic change. Fetal origin LEFT PCA. No
saccular aneurysm. No vascular malformation.
IMPRESSION: Acute intraparenchymal hemorrhage as identified on CT, posterior
LEFT lentiform nucleus and internal capsule, measuring 24 x 29 x 30
mm corresponding to a volume of 10.5 mL. Mild surrounding AVM
without midline shift.

Widespread chronic microbleeds throughout the supratentorial and
infratentorial compartment, along with extensive focal and confluent
ischemic demyelination consistent with longstanding chronic
hypertensive cerebrovascular disease.

No intracranial vascular malformation, proximal stenosis, or
dissection.

## 2018-04-24 DIAGNOSIS — I10 Essential (primary) hypertension: Secondary | ICD-10-CM | POA: Diagnosis not present

## 2018-04-24 DIAGNOSIS — G4700 Insomnia, unspecified: Secondary | ICD-10-CM | POA: Diagnosis not present

## 2018-05-21 DIAGNOSIS — E039 Hypothyroidism, unspecified: Secondary | ICD-10-CM | POA: Diagnosis not present

## 2018-05-28 DIAGNOSIS — Z9889 Other specified postprocedural states: Secondary | ICD-10-CM | POA: Diagnosis not present

## 2018-05-28 DIAGNOSIS — E039 Hypothyroidism, unspecified: Secondary | ICD-10-CM | POA: Diagnosis not present

## 2018-05-28 DIAGNOSIS — Z6821 Body mass index (BMI) 21.0-21.9, adult: Secondary | ICD-10-CM | POA: Diagnosis not present

## 2018-06-27 DIAGNOSIS — I6389 Other cerebral infarction: Secondary | ICD-10-CM | POA: Diagnosis not present

## 2018-06-27 DIAGNOSIS — I10 Essential (primary) hypertension: Secondary | ICD-10-CM | POA: Diagnosis not present

## 2018-06-27 DIAGNOSIS — E785 Hyperlipidemia, unspecified: Secondary | ICD-10-CM | POA: Diagnosis not present

## 2018-06-27 DIAGNOSIS — E039 Hypothyroidism, unspecified: Secondary | ICD-10-CM | POA: Diagnosis not present

## 2018-07-04 DIAGNOSIS — E785 Hyperlipidemia, unspecified: Secondary | ICD-10-CM | POA: Diagnosis not present

## 2018-07-04 DIAGNOSIS — I6389 Other cerebral infarction: Secondary | ICD-10-CM | POA: Diagnosis not present

## 2018-07-04 DIAGNOSIS — H6123 Impacted cerumen, bilateral: Secondary | ICD-10-CM | POA: Diagnosis not present

## 2018-07-04 DIAGNOSIS — E871 Hypo-osmolality and hyponatremia: Secondary | ICD-10-CM | POA: Diagnosis not present

## 2018-07-04 DIAGNOSIS — I10 Essential (primary) hypertension: Secondary | ICD-10-CM | POA: Diagnosis not present

## 2018-07-04 DIAGNOSIS — E039 Hypothyroidism, unspecified: Secondary | ICD-10-CM | POA: Diagnosis not present

## 2018-07-11 DIAGNOSIS — H6123 Impacted cerumen, bilateral: Secondary | ICD-10-CM | POA: Diagnosis not present

## 2018-10-11 DIAGNOSIS — L821 Other seborrheic keratosis: Secondary | ICD-10-CM | POA: Diagnosis not present

## 2018-10-11 DIAGNOSIS — D225 Melanocytic nevi of trunk: Secondary | ICD-10-CM | POA: Diagnosis not present

## 2018-10-11 DIAGNOSIS — D2261 Melanocytic nevi of right upper limb, including shoulder: Secondary | ICD-10-CM | POA: Diagnosis not present

## 2018-10-11 DIAGNOSIS — L218 Other seborrheic dermatitis: Secondary | ICD-10-CM | POA: Diagnosis not present

## 2018-10-11 DIAGNOSIS — L814 Other melanin hyperpigmentation: Secondary | ICD-10-CM | POA: Diagnosis not present

## 2018-10-11 DIAGNOSIS — L918 Other hypertrophic disorders of the skin: Secondary | ICD-10-CM | POA: Diagnosis not present

## 2018-10-11 DIAGNOSIS — D1801 Hemangioma of skin and subcutaneous tissue: Secondary | ICD-10-CM | POA: Diagnosis not present

## 2018-10-11 DIAGNOSIS — L570 Actinic keratosis: Secondary | ICD-10-CM | POA: Diagnosis not present

## 2018-10-11 DIAGNOSIS — D2262 Melanocytic nevi of left upper limb, including shoulder: Secondary | ICD-10-CM | POA: Diagnosis not present

## 2018-11-14 ENCOUNTER — Other Ambulatory Visit: Payer: Self-pay

## 2018-11-19 DIAGNOSIS — E871 Hypo-osmolality and hyponatremia: Secondary | ICD-10-CM | POA: Diagnosis not present

## 2018-11-19 DIAGNOSIS — E039 Hypothyroidism, unspecified: Secondary | ICD-10-CM | POA: Diagnosis not present

## 2018-11-19 DIAGNOSIS — I6389 Other cerebral infarction: Secondary | ICD-10-CM | POA: Diagnosis not present

## 2018-11-19 DIAGNOSIS — I10 Essential (primary) hypertension: Secondary | ICD-10-CM | POA: Diagnosis not present

## 2018-11-19 DIAGNOSIS — E785 Hyperlipidemia, unspecified: Secondary | ICD-10-CM | POA: Diagnosis not present

## 2018-11-26 DIAGNOSIS — Z7189 Other specified counseling: Secondary | ICD-10-CM | POA: Diagnosis not present

## 2018-11-26 DIAGNOSIS — Z9889 Other specified postprocedural states: Secondary | ICD-10-CM | POA: Diagnosis not present

## 2018-11-26 DIAGNOSIS — E039 Hypothyroidism, unspecified: Secondary | ICD-10-CM | POA: Diagnosis not present

## 2018-11-26 DIAGNOSIS — Z6821 Body mass index (BMI) 21.0-21.9, adult: Secondary | ICD-10-CM | POA: Diagnosis not present

## 2018-12-20 DIAGNOSIS — R682 Dry mouth, unspecified: Secondary | ICD-10-CM | POA: Diagnosis not present

## 2018-12-20 DIAGNOSIS — Z7189 Other specified counseling: Secondary | ICD-10-CM | POA: Diagnosis not present

## 2018-12-20 DIAGNOSIS — I6389 Other cerebral infarction: Secondary | ICD-10-CM | POA: Diagnosis not present

## 2018-12-20 DIAGNOSIS — R269 Unspecified abnormalities of gait and mobility: Secondary | ICD-10-CM | POA: Diagnosis not present

## 2018-12-20 DIAGNOSIS — E785 Hyperlipidemia, unspecified: Secondary | ICD-10-CM | POA: Diagnosis not present

## 2018-12-20 DIAGNOSIS — E039 Hypothyroidism, unspecified: Secondary | ICD-10-CM | POA: Diagnosis not present

## 2018-12-20 DIAGNOSIS — I10 Essential (primary) hypertension: Secondary | ICD-10-CM | POA: Diagnosis not present

## 2018-12-20 DIAGNOSIS — E871 Hypo-osmolality and hyponatremia: Secondary | ICD-10-CM | POA: Diagnosis not present

## 2018-12-20 DIAGNOSIS — F411 Generalized anxiety disorder: Secondary | ICD-10-CM | POA: Diagnosis not present

## 2018-12-20 DIAGNOSIS — Z0000 Encounter for general adult medical examination without abnormal findings: Secondary | ICD-10-CM | POA: Diagnosis not present

## 2019-01-02 DIAGNOSIS — R260 Ataxic gait: Secondary | ICD-10-CM | POA: Diagnosis not present

## 2019-01-02 DIAGNOSIS — M6281 Muscle weakness (generalized): Secondary | ICD-10-CM | POA: Diagnosis not present

## 2019-01-02 DIAGNOSIS — R262 Difficulty in walking, not elsewhere classified: Secondary | ICD-10-CM | POA: Diagnosis not present

## 2019-01-02 DIAGNOSIS — M545 Low back pain: Secondary | ICD-10-CM | POA: Diagnosis not present

## 2019-01-07 DIAGNOSIS — R260 Ataxic gait: Secondary | ICD-10-CM | POA: Diagnosis not present

## 2019-01-07 DIAGNOSIS — M6281 Muscle weakness (generalized): Secondary | ICD-10-CM | POA: Diagnosis not present

## 2019-01-07 DIAGNOSIS — M545 Low back pain: Secondary | ICD-10-CM | POA: Diagnosis not present

## 2019-01-07 DIAGNOSIS — R262 Difficulty in walking, not elsewhere classified: Secondary | ICD-10-CM | POA: Diagnosis not present

## 2019-01-09 DIAGNOSIS — R262 Difficulty in walking, not elsewhere classified: Secondary | ICD-10-CM | POA: Diagnosis not present

## 2019-01-09 DIAGNOSIS — M6281 Muscle weakness (generalized): Secondary | ICD-10-CM | POA: Diagnosis not present

## 2019-01-09 DIAGNOSIS — M545 Low back pain: Secondary | ICD-10-CM | POA: Diagnosis not present

## 2019-01-09 DIAGNOSIS — R260 Ataxic gait: Secondary | ICD-10-CM | POA: Diagnosis not present

## 2019-01-14 DIAGNOSIS — M6281 Muscle weakness (generalized): Secondary | ICD-10-CM | POA: Diagnosis not present

## 2019-01-14 DIAGNOSIS — R262 Difficulty in walking, not elsewhere classified: Secondary | ICD-10-CM | POA: Diagnosis not present

## 2019-01-14 DIAGNOSIS — M545 Low back pain: Secondary | ICD-10-CM | POA: Diagnosis not present

## 2019-01-14 DIAGNOSIS — R260 Ataxic gait: Secondary | ICD-10-CM | POA: Diagnosis not present

## 2019-01-16 DIAGNOSIS — M6281 Muscle weakness (generalized): Secondary | ICD-10-CM | POA: Diagnosis not present

## 2019-01-16 DIAGNOSIS — R260 Ataxic gait: Secondary | ICD-10-CM | POA: Diagnosis not present

## 2019-01-16 DIAGNOSIS — M545 Low back pain: Secondary | ICD-10-CM | POA: Diagnosis not present

## 2019-01-16 DIAGNOSIS — R262 Difficulty in walking, not elsewhere classified: Secondary | ICD-10-CM | POA: Diagnosis not present

## 2019-01-21 DIAGNOSIS — R262 Difficulty in walking, not elsewhere classified: Secondary | ICD-10-CM | POA: Diagnosis not present

## 2019-01-21 DIAGNOSIS — M545 Low back pain: Secondary | ICD-10-CM | POA: Diagnosis not present

## 2019-01-21 DIAGNOSIS — M6281 Muscle weakness (generalized): Secondary | ICD-10-CM | POA: Diagnosis not present

## 2019-01-21 DIAGNOSIS — R260 Ataxic gait: Secondary | ICD-10-CM | POA: Diagnosis not present

## 2019-01-23 DIAGNOSIS — R260 Ataxic gait: Secondary | ICD-10-CM | POA: Diagnosis not present

## 2019-01-23 DIAGNOSIS — M6281 Muscle weakness (generalized): Secondary | ICD-10-CM | POA: Diagnosis not present

## 2019-01-23 DIAGNOSIS — M545 Low back pain: Secondary | ICD-10-CM | POA: Diagnosis not present

## 2019-01-23 DIAGNOSIS — R262 Difficulty in walking, not elsewhere classified: Secondary | ICD-10-CM | POA: Diagnosis not present

## 2019-01-28 DIAGNOSIS — R260 Ataxic gait: Secondary | ICD-10-CM | POA: Diagnosis not present

## 2019-01-28 DIAGNOSIS — M6281 Muscle weakness (generalized): Secondary | ICD-10-CM | POA: Diagnosis not present

## 2019-01-28 DIAGNOSIS — M545 Low back pain: Secondary | ICD-10-CM | POA: Diagnosis not present

## 2019-01-28 DIAGNOSIS — R262 Difficulty in walking, not elsewhere classified: Secondary | ICD-10-CM | POA: Diagnosis not present

## 2019-01-30 DIAGNOSIS — R262 Difficulty in walking, not elsewhere classified: Secondary | ICD-10-CM | POA: Diagnosis not present

## 2019-01-30 DIAGNOSIS — M545 Low back pain: Secondary | ICD-10-CM | POA: Diagnosis not present

## 2019-01-30 DIAGNOSIS — M6281 Muscle weakness (generalized): Secondary | ICD-10-CM | POA: Diagnosis not present

## 2019-01-30 DIAGNOSIS — R260 Ataxic gait: Secondary | ICD-10-CM | POA: Diagnosis not present

## 2019-02-04 DIAGNOSIS — R260 Ataxic gait: Secondary | ICD-10-CM | POA: Diagnosis not present

## 2019-02-04 DIAGNOSIS — R262 Difficulty in walking, not elsewhere classified: Secondary | ICD-10-CM | POA: Diagnosis not present

## 2019-02-04 DIAGNOSIS — M6281 Muscle weakness (generalized): Secondary | ICD-10-CM | POA: Diagnosis not present

## 2019-02-04 DIAGNOSIS — M545 Low back pain: Secondary | ICD-10-CM | POA: Diagnosis not present

## 2019-02-06 DIAGNOSIS — M6281 Muscle weakness (generalized): Secondary | ICD-10-CM | POA: Diagnosis not present

## 2019-02-06 DIAGNOSIS — R260 Ataxic gait: Secondary | ICD-10-CM | POA: Diagnosis not present

## 2019-02-06 DIAGNOSIS — R262 Difficulty in walking, not elsewhere classified: Secondary | ICD-10-CM | POA: Diagnosis not present

## 2019-02-06 DIAGNOSIS — M545 Low back pain: Secondary | ICD-10-CM | POA: Diagnosis not present

## 2019-02-11 DIAGNOSIS — R262 Difficulty in walking, not elsewhere classified: Secondary | ICD-10-CM | POA: Diagnosis not present

## 2019-02-11 DIAGNOSIS — M545 Low back pain: Secondary | ICD-10-CM | POA: Diagnosis not present

## 2019-02-11 DIAGNOSIS — R260 Ataxic gait: Secondary | ICD-10-CM | POA: Diagnosis not present

## 2019-02-11 DIAGNOSIS — M6281 Muscle weakness (generalized): Secondary | ICD-10-CM | POA: Diagnosis not present

## 2019-02-18 DIAGNOSIS — M6281 Muscle weakness (generalized): Secondary | ICD-10-CM | POA: Diagnosis not present

## 2019-02-18 DIAGNOSIS — R262 Difficulty in walking, not elsewhere classified: Secondary | ICD-10-CM | POA: Diagnosis not present

## 2019-02-18 DIAGNOSIS — R260 Ataxic gait: Secondary | ICD-10-CM | POA: Diagnosis not present

## 2019-02-18 DIAGNOSIS — M545 Low back pain: Secondary | ICD-10-CM | POA: Diagnosis not present

## 2019-02-20 DIAGNOSIS — M6281 Muscle weakness (generalized): Secondary | ICD-10-CM | POA: Diagnosis not present

## 2019-02-20 DIAGNOSIS — R260 Ataxic gait: Secondary | ICD-10-CM | POA: Diagnosis not present

## 2019-02-20 DIAGNOSIS — R262 Difficulty in walking, not elsewhere classified: Secondary | ICD-10-CM | POA: Diagnosis not present

## 2019-02-20 DIAGNOSIS — M545 Low back pain: Secondary | ICD-10-CM | POA: Diagnosis not present

## 2019-02-25 DIAGNOSIS — R260 Ataxic gait: Secondary | ICD-10-CM | POA: Diagnosis not present

## 2019-02-25 DIAGNOSIS — M545 Low back pain: Secondary | ICD-10-CM | POA: Diagnosis not present

## 2019-02-25 DIAGNOSIS — R262 Difficulty in walking, not elsewhere classified: Secondary | ICD-10-CM | POA: Diagnosis not present

## 2019-02-25 DIAGNOSIS — M6281 Muscle weakness (generalized): Secondary | ICD-10-CM | POA: Diagnosis not present

## 2019-02-26 DIAGNOSIS — E039 Hypothyroidism, unspecified: Secondary | ICD-10-CM | POA: Diagnosis not present

## 2019-02-27 DIAGNOSIS — M6281 Muscle weakness (generalized): Secondary | ICD-10-CM | POA: Diagnosis not present

## 2019-02-27 DIAGNOSIS — R260 Ataxic gait: Secondary | ICD-10-CM | POA: Diagnosis not present

## 2019-02-27 DIAGNOSIS — M545 Low back pain: Secondary | ICD-10-CM | POA: Diagnosis not present

## 2019-02-27 DIAGNOSIS — R262 Difficulty in walking, not elsewhere classified: Secondary | ICD-10-CM | POA: Diagnosis not present

## 2019-03-04 DIAGNOSIS — R260 Ataxic gait: Secondary | ICD-10-CM | POA: Diagnosis not present

## 2019-03-04 DIAGNOSIS — M545 Low back pain: Secondary | ICD-10-CM | POA: Diagnosis not present

## 2019-03-04 DIAGNOSIS — R262 Difficulty in walking, not elsewhere classified: Secondary | ICD-10-CM | POA: Diagnosis not present

## 2019-03-04 DIAGNOSIS — M6281 Muscle weakness (generalized): Secondary | ICD-10-CM | POA: Diagnosis not present

## 2019-03-05 DIAGNOSIS — E039 Hypothyroidism, unspecified: Secondary | ICD-10-CM | POA: Diagnosis not present

## 2019-03-05 DIAGNOSIS — E871 Hypo-osmolality and hyponatremia: Secondary | ICD-10-CM | POA: Diagnosis not present

## 2019-03-05 DIAGNOSIS — Z7189 Other specified counseling: Secondary | ICD-10-CM | POA: Diagnosis not present

## 2019-03-05 DIAGNOSIS — Z9889 Other specified postprocedural states: Secondary | ICD-10-CM | POA: Diagnosis not present

## 2019-03-06 DIAGNOSIS — R260 Ataxic gait: Secondary | ICD-10-CM | POA: Diagnosis not present

## 2019-03-06 DIAGNOSIS — M6281 Muscle weakness (generalized): Secondary | ICD-10-CM | POA: Diagnosis not present

## 2019-03-06 DIAGNOSIS — R262 Difficulty in walking, not elsewhere classified: Secondary | ICD-10-CM | POA: Diagnosis not present

## 2019-03-06 DIAGNOSIS — M545 Low back pain: Secondary | ICD-10-CM | POA: Diagnosis not present

## 2019-03-11 DIAGNOSIS — M545 Low back pain: Secondary | ICD-10-CM | POA: Diagnosis not present

## 2019-03-11 DIAGNOSIS — R262 Difficulty in walking, not elsewhere classified: Secondary | ICD-10-CM | POA: Diagnosis not present

## 2019-03-11 DIAGNOSIS — R260 Ataxic gait: Secondary | ICD-10-CM | POA: Diagnosis not present

## 2019-03-11 DIAGNOSIS — M6281 Muscle weakness (generalized): Secondary | ICD-10-CM | POA: Diagnosis not present

## 2019-04-22 DIAGNOSIS — M6281 Muscle weakness (generalized): Secondary | ICD-10-CM | POA: Diagnosis not present

## 2019-04-22 DIAGNOSIS — R260 Ataxic gait: Secondary | ICD-10-CM | POA: Diagnosis not present

## 2019-04-22 DIAGNOSIS — M545 Low back pain: Secondary | ICD-10-CM | POA: Diagnosis not present

## 2019-04-22 DIAGNOSIS — R262 Difficulty in walking, not elsewhere classified: Secondary | ICD-10-CM | POA: Diagnosis not present

## 2019-04-24 DIAGNOSIS — M6281 Muscle weakness (generalized): Secondary | ICD-10-CM | POA: Diagnosis not present

## 2019-04-24 DIAGNOSIS — R262 Difficulty in walking, not elsewhere classified: Secondary | ICD-10-CM | POA: Diagnosis not present

## 2019-04-24 DIAGNOSIS — M545 Low back pain: Secondary | ICD-10-CM | POA: Diagnosis not present

## 2019-04-24 DIAGNOSIS — R260 Ataxic gait: Secondary | ICD-10-CM | POA: Diagnosis not present

## 2019-04-29 DIAGNOSIS — M6281 Muscle weakness (generalized): Secondary | ICD-10-CM | POA: Diagnosis not present

## 2019-04-29 DIAGNOSIS — M545 Low back pain: Secondary | ICD-10-CM | POA: Diagnosis not present

## 2019-04-29 DIAGNOSIS — R262 Difficulty in walking, not elsewhere classified: Secondary | ICD-10-CM | POA: Diagnosis not present

## 2019-04-29 DIAGNOSIS — R260 Ataxic gait: Secondary | ICD-10-CM | POA: Diagnosis not present

## 2019-05-01 DIAGNOSIS — R262 Difficulty in walking, not elsewhere classified: Secondary | ICD-10-CM | POA: Diagnosis not present

## 2019-05-01 DIAGNOSIS — M545 Low back pain: Secondary | ICD-10-CM | POA: Diagnosis not present

## 2019-05-01 DIAGNOSIS — R260 Ataxic gait: Secondary | ICD-10-CM | POA: Diagnosis not present

## 2019-05-01 DIAGNOSIS — M6281 Muscle weakness (generalized): Secondary | ICD-10-CM | POA: Diagnosis not present

## 2019-05-06 DIAGNOSIS — R262 Difficulty in walking, not elsewhere classified: Secondary | ICD-10-CM | POA: Diagnosis not present

## 2019-05-06 DIAGNOSIS — R260 Ataxic gait: Secondary | ICD-10-CM | POA: Diagnosis not present

## 2019-05-06 DIAGNOSIS — M545 Low back pain: Secondary | ICD-10-CM | POA: Diagnosis not present

## 2019-05-06 DIAGNOSIS — M6281 Muscle weakness (generalized): Secondary | ICD-10-CM | POA: Diagnosis not present

## 2019-05-08 DIAGNOSIS — R262 Difficulty in walking, not elsewhere classified: Secondary | ICD-10-CM | POA: Diagnosis not present

## 2019-05-08 DIAGNOSIS — M6281 Muscle weakness (generalized): Secondary | ICD-10-CM | POA: Diagnosis not present

## 2019-05-08 DIAGNOSIS — R260 Ataxic gait: Secondary | ICD-10-CM | POA: Diagnosis not present

## 2019-05-08 DIAGNOSIS — M545 Low back pain: Secondary | ICD-10-CM | POA: Diagnosis not present

## 2019-05-13 DIAGNOSIS — M6281 Muscle weakness (generalized): Secondary | ICD-10-CM | POA: Diagnosis not present

## 2019-05-13 DIAGNOSIS — M545 Low back pain: Secondary | ICD-10-CM | POA: Diagnosis not present

## 2019-05-13 DIAGNOSIS — R260 Ataxic gait: Secondary | ICD-10-CM | POA: Diagnosis not present

## 2019-05-13 DIAGNOSIS — R262 Difficulty in walking, not elsewhere classified: Secondary | ICD-10-CM | POA: Diagnosis not present

## 2019-05-15 DIAGNOSIS — R262 Difficulty in walking, not elsewhere classified: Secondary | ICD-10-CM | POA: Diagnosis not present

## 2019-05-15 DIAGNOSIS — R260 Ataxic gait: Secondary | ICD-10-CM | POA: Diagnosis not present

## 2019-05-15 DIAGNOSIS — M6281 Muscle weakness (generalized): Secondary | ICD-10-CM | POA: Diagnosis not present

## 2019-05-15 DIAGNOSIS — M545 Low back pain: Secondary | ICD-10-CM | POA: Diagnosis not present

## 2019-05-20 DIAGNOSIS — R260 Ataxic gait: Secondary | ICD-10-CM | POA: Diagnosis not present

## 2019-05-20 DIAGNOSIS — R262 Difficulty in walking, not elsewhere classified: Secondary | ICD-10-CM | POA: Diagnosis not present

## 2019-05-20 DIAGNOSIS — M6281 Muscle weakness (generalized): Secondary | ICD-10-CM | POA: Diagnosis not present

## 2019-05-20 DIAGNOSIS — M545 Low back pain: Secondary | ICD-10-CM | POA: Diagnosis not present

## 2019-05-22 DIAGNOSIS — M545 Low back pain: Secondary | ICD-10-CM | POA: Diagnosis not present

## 2019-05-22 DIAGNOSIS — R260 Ataxic gait: Secondary | ICD-10-CM | POA: Diagnosis not present

## 2019-05-22 DIAGNOSIS — R262 Difficulty in walking, not elsewhere classified: Secondary | ICD-10-CM | POA: Diagnosis not present

## 2019-05-22 DIAGNOSIS — M6281 Muscle weakness (generalized): Secondary | ICD-10-CM | POA: Diagnosis not present

## 2019-05-27 DIAGNOSIS — R262 Difficulty in walking, not elsewhere classified: Secondary | ICD-10-CM | POA: Diagnosis not present

## 2019-05-27 DIAGNOSIS — M6281 Muscle weakness (generalized): Secondary | ICD-10-CM | POA: Diagnosis not present

## 2019-05-27 DIAGNOSIS — M545 Low back pain: Secondary | ICD-10-CM | POA: Diagnosis not present

## 2019-05-27 DIAGNOSIS — R260 Ataxic gait: Secondary | ICD-10-CM | POA: Diagnosis not present

## 2019-05-29 DIAGNOSIS — R260 Ataxic gait: Secondary | ICD-10-CM | POA: Diagnosis not present

## 2019-05-29 DIAGNOSIS — R262 Difficulty in walking, not elsewhere classified: Secondary | ICD-10-CM | POA: Diagnosis not present

## 2019-05-29 DIAGNOSIS — M545 Low back pain: Secondary | ICD-10-CM | POA: Diagnosis not present

## 2019-05-29 DIAGNOSIS — M6281 Muscle weakness (generalized): Secondary | ICD-10-CM | POA: Diagnosis not present

## 2019-06-03 DIAGNOSIS — M6281 Muscle weakness (generalized): Secondary | ICD-10-CM | POA: Diagnosis not present

## 2019-06-03 DIAGNOSIS — M545 Low back pain: Secondary | ICD-10-CM | POA: Diagnosis not present

## 2019-06-03 DIAGNOSIS — R260 Ataxic gait: Secondary | ICD-10-CM | POA: Diagnosis not present

## 2019-06-03 DIAGNOSIS — R262 Difficulty in walking, not elsewhere classified: Secondary | ICD-10-CM | POA: Diagnosis not present

## 2019-06-05 DIAGNOSIS — M545 Low back pain: Secondary | ICD-10-CM | POA: Diagnosis not present

## 2019-06-05 DIAGNOSIS — R262 Difficulty in walking, not elsewhere classified: Secondary | ICD-10-CM | POA: Diagnosis not present

## 2019-06-05 DIAGNOSIS — R260 Ataxic gait: Secondary | ICD-10-CM | POA: Diagnosis not present

## 2019-06-05 DIAGNOSIS — M6281 Muscle weakness (generalized): Secondary | ICD-10-CM | POA: Diagnosis not present

## 2019-06-10 DIAGNOSIS — R262 Difficulty in walking, not elsewhere classified: Secondary | ICD-10-CM | POA: Diagnosis not present

## 2019-06-10 DIAGNOSIS — M6281 Muscle weakness (generalized): Secondary | ICD-10-CM | POA: Diagnosis not present

## 2019-06-10 DIAGNOSIS — R260 Ataxic gait: Secondary | ICD-10-CM | POA: Diagnosis not present

## 2019-06-10 DIAGNOSIS — M545 Low back pain: Secondary | ICD-10-CM | POA: Diagnosis not present

## 2019-06-12 DIAGNOSIS — R262 Difficulty in walking, not elsewhere classified: Secondary | ICD-10-CM | POA: Diagnosis not present

## 2019-06-12 DIAGNOSIS — R260 Ataxic gait: Secondary | ICD-10-CM | POA: Diagnosis not present

## 2019-06-12 DIAGNOSIS — M545 Low back pain: Secondary | ICD-10-CM | POA: Diagnosis not present

## 2019-06-12 DIAGNOSIS — M6281 Muscle weakness (generalized): Secondary | ICD-10-CM | POA: Diagnosis not present

## 2019-06-17 DIAGNOSIS — M6281 Muscle weakness (generalized): Secondary | ICD-10-CM | POA: Diagnosis not present

## 2019-06-17 DIAGNOSIS — R262 Difficulty in walking, not elsewhere classified: Secondary | ICD-10-CM | POA: Diagnosis not present

## 2019-06-17 DIAGNOSIS — R260 Ataxic gait: Secondary | ICD-10-CM | POA: Diagnosis not present

## 2019-06-17 DIAGNOSIS — M545 Low back pain: Secondary | ICD-10-CM | POA: Diagnosis not present

## 2019-06-24 DIAGNOSIS — R260 Ataxic gait: Secondary | ICD-10-CM | POA: Diagnosis not present

## 2019-06-24 DIAGNOSIS — M6281 Muscle weakness (generalized): Secondary | ICD-10-CM | POA: Diagnosis not present

## 2019-06-24 DIAGNOSIS — R262 Difficulty in walking, not elsewhere classified: Secondary | ICD-10-CM | POA: Diagnosis not present

## 2019-06-24 DIAGNOSIS — M545 Low back pain: Secondary | ICD-10-CM | POA: Diagnosis not present

## 2019-07-01 DIAGNOSIS — R262 Difficulty in walking, not elsewhere classified: Secondary | ICD-10-CM | POA: Diagnosis not present

## 2019-07-01 DIAGNOSIS — R260 Ataxic gait: Secondary | ICD-10-CM | POA: Diagnosis not present

## 2019-07-01 DIAGNOSIS — M6281 Muscle weakness (generalized): Secondary | ICD-10-CM | POA: Diagnosis not present

## 2019-07-01 DIAGNOSIS — M545 Low back pain: Secondary | ICD-10-CM | POA: Diagnosis not present

## 2019-07-03 DIAGNOSIS — R260 Ataxic gait: Secondary | ICD-10-CM | POA: Diagnosis not present

## 2019-07-03 DIAGNOSIS — E785 Hyperlipidemia, unspecified: Secondary | ICD-10-CM | POA: Diagnosis not present

## 2019-07-03 DIAGNOSIS — M6281 Muscle weakness (generalized): Secondary | ICD-10-CM | POA: Diagnosis not present

## 2019-07-03 DIAGNOSIS — R262 Difficulty in walking, not elsewhere classified: Secondary | ICD-10-CM | POA: Diagnosis not present

## 2019-07-03 DIAGNOSIS — I10 Essential (primary) hypertension: Secondary | ICD-10-CM | POA: Diagnosis not present

## 2019-07-03 DIAGNOSIS — E039 Hypothyroidism, unspecified: Secondary | ICD-10-CM | POA: Diagnosis not present

## 2019-07-03 DIAGNOSIS — M545 Low back pain: Secondary | ICD-10-CM | POA: Diagnosis not present

## 2019-07-03 DIAGNOSIS — E871 Hypo-osmolality and hyponatremia: Secondary | ICD-10-CM | POA: Diagnosis not present

## 2019-07-03 DIAGNOSIS — R269 Unspecified abnormalities of gait and mobility: Secondary | ICD-10-CM | POA: Diagnosis not present

## 2019-07-03 DIAGNOSIS — I6389 Other cerebral infarction: Secondary | ICD-10-CM | POA: Diagnosis not present

## 2019-07-03 DIAGNOSIS — Z7189 Other specified counseling: Secondary | ICD-10-CM | POA: Diagnosis not present

## 2019-07-03 DIAGNOSIS — R682 Dry mouth, unspecified: Secondary | ICD-10-CM | POA: Diagnosis not present

## 2019-07-08 DIAGNOSIS — M6281 Muscle weakness (generalized): Secondary | ICD-10-CM | POA: Diagnosis not present

## 2019-07-08 DIAGNOSIS — M545 Low back pain: Secondary | ICD-10-CM | POA: Diagnosis not present

## 2019-07-08 DIAGNOSIS — R262 Difficulty in walking, not elsewhere classified: Secondary | ICD-10-CM | POA: Diagnosis not present

## 2019-07-08 DIAGNOSIS — R260 Ataxic gait: Secondary | ICD-10-CM | POA: Diagnosis not present

## 2019-07-10 DIAGNOSIS — Z9889 Other specified postprocedural states: Secondary | ICD-10-CM | POA: Diagnosis not present

## 2019-07-10 DIAGNOSIS — Z7189 Other specified counseling: Secondary | ICD-10-CM | POA: Diagnosis not present

## 2019-07-10 DIAGNOSIS — R260 Ataxic gait: Secondary | ICD-10-CM | POA: Diagnosis not present

## 2019-07-10 DIAGNOSIS — M545 Low back pain: Secondary | ICD-10-CM | POA: Diagnosis not present

## 2019-07-10 DIAGNOSIS — M6281 Muscle weakness (generalized): Secondary | ICD-10-CM | POA: Diagnosis not present

## 2019-07-10 DIAGNOSIS — E871 Hypo-osmolality and hyponatremia: Secondary | ICD-10-CM | POA: Diagnosis not present

## 2019-07-10 DIAGNOSIS — R262 Difficulty in walking, not elsewhere classified: Secondary | ICD-10-CM | POA: Diagnosis not present

## 2019-07-10 DIAGNOSIS — E039 Hypothyroidism, unspecified: Secondary | ICD-10-CM | POA: Diagnosis not present

## 2019-07-17 DIAGNOSIS — R260 Ataxic gait: Secondary | ICD-10-CM | POA: Diagnosis not present

## 2019-07-17 DIAGNOSIS — M6281 Muscle weakness (generalized): Secondary | ICD-10-CM | POA: Diagnosis not present

## 2019-07-17 DIAGNOSIS — R262 Difficulty in walking, not elsewhere classified: Secondary | ICD-10-CM | POA: Diagnosis not present

## 2019-07-17 DIAGNOSIS — M545 Low back pain: Secondary | ICD-10-CM | POA: Diagnosis not present

## 2019-07-21 DIAGNOSIS — H04123 Dry eye syndrome of bilateral lacrimal glands: Secondary | ICD-10-CM | POA: Diagnosis not present

## 2019-07-21 DIAGNOSIS — R682 Dry mouth, unspecified: Secondary | ICD-10-CM | POA: Diagnosis not present

## 2019-07-21 DIAGNOSIS — R768 Other specified abnormal immunological findings in serum: Secondary | ICD-10-CM | POA: Diagnosis not present

## 2019-07-21 DIAGNOSIS — E871 Hypo-osmolality and hyponatremia: Secondary | ICD-10-CM | POA: Diagnosis not present

## 2019-07-24 DIAGNOSIS — M6281 Muscle weakness (generalized): Secondary | ICD-10-CM | POA: Diagnosis not present

## 2019-07-24 DIAGNOSIS — M545 Low back pain: Secondary | ICD-10-CM | POA: Diagnosis not present

## 2019-07-24 DIAGNOSIS — R260 Ataxic gait: Secondary | ICD-10-CM | POA: Diagnosis not present

## 2019-07-24 DIAGNOSIS — R262 Difficulty in walking, not elsewhere classified: Secondary | ICD-10-CM | POA: Diagnosis not present

## 2019-07-29 DIAGNOSIS — R260 Ataxic gait: Secondary | ICD-10-CM | POA: Diagnosis not present

## 2019-07-29 DIAGNOSIS — M6281 Muscle weakness (generalized): Secondary | ICD-10-CM | POA: Diagnosis not present

## 2019-07-29 DIAGNOSIS — R262 Difficulty in walking, not elsewhere classified: Secondary | ICD-10-CM | POA: Diagnosis not present

## 2019-07-29 DIAGNOSIS — M545 Low back pain: Secondary | ICD-10-CM | POA: Diagnosis not present

## 2019-07-31 DIAGNOSIS — R262 Difficulty in walking, not elsewhere classified: Secondary | ICD-10-CM | POA: Diagnosis not present

## 2019-07-31 DIAGNOSIS — M545 Low back pain: Secondary | ICD-10-CM | POA: Diagnosis not present

## 2019-07-31 DIAGNOSIS — M6281 Muscle weakness (generalized): Secondary | ICD-10-CM | POA: Diagnosis not present

## 2019-07-31 DIAGNOSIS — R260 Ataxic gait: Secondary | ICD-10-CM | POA: Diagnosis not present

## 2019-08-29 DIAGNOSIS — H5203 Hypermetropia, bilateral: Secondary | ICD-10-CM | POA: Diagnosis not present

## 2019-08-29 DIAGNOSIS — Z961 Presence of intraocular lens: Secondary | ICD-10-CM | POA: Diagnosis not present

## 2019-08-29 DIAGNOSIS — G43909 Migraine, unspecified, not intractable, without status migrainosus: Secondary | ICD-10-CM | POA: Diagnosis not present

## 2019-08-29 DIAGNOSIS — H26491 Other secondary cataract, right eye: Secondary | ICD-10-CM | POA: Diagnosis not present

## 2019-11-04 DIAGNOSIS — E039 Hypothyroidism, unspecified: Secondary | ICD-10-CM | POA: Diagnosis not present

## 2019-11-04 DIAGNOSIS — H6123 Impacted cerumen, bilateral: Secondary | ICD-10-CM | POA: Diagnosis not present

## 2019-11-10 DIAGNOSIS — R768 Other specified abnormal immunological findings in serum: Secondary | ICD-10-CM | POA: Diagnosis not present

## 2019-11-10 DIAGNOSIS — Z9889 Other specified postprocedural states: Secondary | ICD-10-CM | POA: Diagnosis not present

## 2019-11-10 DIAGNOSIS — E871 Hypo-osmolality and hyponatremia: Secondary | ICD-10-CM | POA: Diagnosis not present

## 2019-11-10 DIAGNOSIS — E039 Hypothyroidism, unspecified: Secondary | ICD-10-CM | POA: Diagnosis not present

## 2019-11-10 DIAGNOSIS — Z6821 Body mass index (BMI) 21.0-21.9, adult: Secondary | ICD-10-CM | POA: Diagnosis not present

## 2020-02-03 DIAGNOSIS — E039 Hypothyroidism, unspecified: Secondary | ICD-10-CM | POA: Diagnosis not present

## 2020-02-03 DIAGNOSIS — E559 Vitamin D deficiency, unspecified: Secondary | ICD-10-CM | POA: Diagnosis not present

## 2020-02-03 DIAGNOSIS — E871 Hypo-osmolality and hyponatremia: Secondary | ICD-10-CM | POA: Diagnosis not present

## 2020-02-10 DIAGNOSIS — Z6821 Body mass index (BMI) 21.0-21.9, adult: Secondary | ICD-10-CM | POA: Diagnosis not present

## 2020-02-10 DIAGNOSIS — Z9889 Other specified postprocedural states: Secondary | ICD-10-CM | POA: Diagnosis not present

## 2020-02-10 DIAGNOSIS — E039 Hypothyroidism, unspecified: Secondary | ICD-10-CM | POA: Diagnosis not present

## 2020-02-10 DIAGNOSIS — E871 Hypo-osmolality and hyponatremia: Secondary | ICD-10-CM | POA: Diagnosis not present

## 2020-02-10 DIAGNOSIS — R768 Other specified abnormal immunological findings in serum: Secondary | ICD-10-CM | POA: Diagnosis not present

## 2021-03-17 DEATH — deceased
